# Patient Record
Sex: Male | Born: 1935 | Race: Black or African American | Hispanic: No | Smoking: Former smoker
Health system: Southern US, Community
[De-identification: ages and names within clinical notes are randomized; demographics above are authoritative.]

## PROBLEM LIST (undated history)

## (undated) DIAGNOSIS — I2699 Other pulmonary embolism without acute cor pulmonale: Secondary | ICD-10-CM

## (undated) DIAGNOSIS — D571 Sickle-cell disease without crisis: Secondary | ICD-10-CM

## (undated) DIAGNOSIS — J449 Chronic obstructive pulmonary disease, unspecified: Secondary | ICD-10-CM

## (undated) HISTORY — PX: NO PAST SURGERIES: SHX2092

---

## 1999-09-01 ENCOUNTER — Encounter: Payer: Self-pay | Admitting: Emergency Medicine

## 1999-09-01 ENCOUNTER — Inpatient Hospital Stay (HOSPITAL_COMMUNITY): Admission: EM | Admit: 1999-09-01 | Discharge: 1999-09-08 | Payer: Self-pay | Admitting: Pulmonary Disease

## 1999-09-03 ENCOUNTER — Encounter: Payer: Self-pay | Admitting: Pulmonary Disease

## 1999-09-04 ENCOUNTER — Encounter: Payer: Self-pay | Admitting: Pulmonary Disease

## 1999-09-05 ENCOUNTER — Encounter: Payer: Self-pay | Admitting: Pulmonary Disease

## 1999-09-07 ENCOUNTER — Encounter: Payer: Self-pay | Admitting: Pulmonary Disease

## 2001-09-28 ENCOUNTER — Encounter: Payer: Self-pay | Admitting: Emergency Medicine

## 2001-09-28 ENCOUNTER — Inpatient Hospital Stay (HOSPITAL_COMMUNITY): Admission: EM | Admit: 2001-09-28 | Discharge: 2001-10-10 | Payer: Self-pay | Admitting: Emergency Medicine

## 2001-10-05 ENCOUNTER — Encounter: Payer: Self-pay | Admitting: Pulmonary Disease

## 2001-10-06 ENCOUNTER — Encounter: Payer: Self-pay | Admitting: Pulmonary Disease

## 2002-01-11 ENCOUNTER — Emergency Department (HOSPITAL_COMMUNITY): Admission: EM | Admit: 2002-01-11 | Discharge: 2002-01-11 | Payer: Self-pay | Admitting: Emergency Medicine

## 2003-08-26 ENCOUNTER — Inpatient Hospital Stay (HOSPITAL_COMMUNITY): Admission: EM | Admit: 2003-08-26 | Discharge: 2003-09-02 | Payer: Self-pay

## 2003-08-27 ENCOUNTER — Encounter (INDEPENDENT_AMBULATORY_CARE_PROVIDER_SITE_OTHER): Payer: Self-pay | Admitting: Cardiology

## 2004-10-08 ENCOUNTER — Inpatient Hospital Stay (HOSPITAL_COMMUNITY): Admission: EM | Admit: 2004-10-08 | Discharge: 2004-10-20 | Payer: Self-pay | Admitting: Emergency Medicine

## 2004-10-09 ENCOUNTER — Encounter (INDEPENDENT_AMBULATORY_CARE_PROVIDER_SITE_OTHER): Payer: Self-pay | Admitting: Cardiovascular Disease

## 2004-10-16 ENCOUNTER — Encounter (INDEPENDENT_AMBULATORY_CARE_PROVIDER_SITE_OTHER): Payer: Self-pay | Admitting: *Deleted

## 2007-02-13 ENCOUNTER — Inpatient Hospital Stay (HOSPITAL_COMMUNITY): Admission: EM | Admit: 2007-02-13 | Discharge: 2007-02-17 | Payer: Self-pay | Admitting: Emergency Medicine

## 2007-04-13 ENCOUNTER — Emergency Department (HOSPITAL_COMMUNITY): Admission: EM | Admit: 2007-04-13 | Discharge: 2007-04-13 | Payer: Self-pay | Admitting: Emergency Medicine

## 2008-01-27 ENCOUNTER — Inpatient Hospital Stay (HOSPITAL_COMMUNITY): Admission: EM | Admit: 2008-01-27 | Discharge: 2008-02-03 | Payer: Self-pay | Admitting: Emergency Medicine

## 2008-05-24 ENCOUNTER — Inpatient Hospital Stay (HOSPITAL_COMMUNITY): Admission: EM | Admit: 2008-05-24 | Discharge: 2008-06-01 | Payer: Self-pay | Admitting: Emergency Medicine

## 2008-07-01 ENCOUNTER — Inpatient Hospital Stay (HOSPITAL_COMMUNITY): Admission: EM | Admit: 2008-07-01 | Discharge: 2008-07-04 | Payer: Self-pay | Admitting: Emergency Medicine

## 2009-01-02 ENCOUNTER — Encounter: Payer: Self-pay | Admitting: Pulmonary Disease

## 2009-02-15 ENCOUNTER — Inpatient Hospital Stay (HOSPITAL_COMMUNITY): Admission: EM | Admit: 2009-02-15 | Discharge: 2009-02-19 | Payer: Self-pay | Admitting: Emergency Medicine

## 2009-03-05 ENCOUNTER — Inpatient Hospital Stay (HOSPITAL_COMMUNITY): Admission: EM | Admit: 2009-03-05 | Discharge: 2009-03-12 | Payer: Self-pay | Admitting: Emergency Medicine

## 2009-04-09 DIAGNOSIS — D571 Sickle-cell disease without crisis: Secondary | ICD-10-CM

## 2009-04-09 DIAGNOSIS — J309 Allergic rhinitis, unspecified: Secondary | ICD-10-CM

## 2009-04-09 DIAGNOSIS — J441 Chronic obstructive pulmonary disease with (acute) exacerbation: Secondary | ICD-10-CM

## 2009-04-09 DIAGNOSIS — I2789 Other specified pulmonary heart diseases: Secondary | ICD-10-CM | POA: Insufficient documentation

## 2009-04-10 ENCOUNTER — Ambulatory Visit: Payer: Self-pay | Admitting: Pulmonary Disease

## 2009-04-10 DIAGNOSIS — Z86718 Personal history of other venous thrombosis and embolism: Secondary | ICD-10-CM

## 2009-04-10 DIAGNOSIS — J438 Other emphysema: Secondary | ICD-10-CM

## 2009-08-08 ENCOUNTER — Inpatient Hospital Stay (HOSPITAL_COMMUNITY): Admission: EM | Admit: 2009-08-08 | Discharge: 2009-08-09 | Payer: Self-pay | Admitting: Emergency Medicine

## 2009-12-26 ENCOUNTER — Inpatient Hospital Stay (HOSPITAL_COMMUNITY): Admission: EM | Admit: 2009-12-26 | Discharge: 2010-01-01 | Payer: Self-pay | Admitting: Emergency Medicine

## 2009-12-31 ENCOUNTER — Encounter (INDEPENDENT_AMBULATORY_CARE_PROVIDER_SITE_OTHER): Payer: Self-pay | Admitting: Internal Medicine

## 2010-08-14 ENCOUNTER — Emergency Department (HOSPITAL_COMMUNITY)
Admission: EM | Admit: 2010-08-14 | Discharge: 2010-08-14 | Payer: Self-pay | Source: Home / Self Care | Admitting: Emergency Medicine

## 2011-01-08 ENCOUNTER — Emergency Department (HOSPITAL_COMMUNITY): Payer: Medicare Other

## 2011-01-08 ENCOUNTER — Inpatient Hospital Stay (HOSPITAL_COMMUNITY)
Admission: EM | Admit: 2011-01-08 | Discharge: 2011-01-15 | DRG: 190 | Disposition: A | Discharge status: Home / Self Care | Payer: Medicare Other | Attending: Internal Medicine | Admitting: Internal Medicine

## 2011-01-08 DIAGNOSIS — E139 Other specified diabetes mellitus without complications: Secondary | ICD-10-CM | POA: Diagnosis not present

## 2011-01-08 DIAGNOSIS — J209 Acute bronchitis, unspecified: Principal | ICD-10-CM | POA: Diagnosis present

## 2011-01-08 DIAGNOSIS — T380X5A Adverse effect of glucocorticoids and synthetic analogues, initial encounter: Secondary | ICD-10-CM | POA: Diagnosis not present

## 2011-01-08 DIAGNOSIS — Z7901 Long term (current) use of anticoagulants: Secondary | ICD-10-CM

## 2011-01-08 DIAGNOSIS — R079 Chest pain, unspecified: Secondary | ICD-10-CM | POA: Diagnosis present

## 2011-01-08 DIAGNOSIS — Z86711 Personal history of pulmonary embolism: Secondary | ICD-10-CM

## 2011-01-08 DIAGNOSIS — J440 Chronic obstructive pulmonary disease with acute lower respiratory infection: Principal | ICD-10-CM | POA: Diagnosis present

## 2011-01-08 DIAGNOSIS — I10 Essential (primary) hypertension: Secondary | ICD-10-CM | POA: Diagnosis present

## 2011-01-08 DIAGNOSIS — D5700 Hb-SS disease with crisis, unspecified: Secondary | ICD-10-CM | POA: Diagnosis not present

## 2011-01-08 DIAGNOSIS — D509 Iron deficiency anemia, unspecified: Secondary | ICD-10-CM | POA: Diagnosis present

## 2011-01-08 DIAGNOSIS — E871 Hypo-osmolality and hyponatremia: Secondary | ICD-10-CM | POA: Diagnosis not present

## 2011-01-08 DIAGNOSIS — K5900 Constipation, unspecified: Secondary | ICD-10-CM | POA: Diagnosis not present

## 2011-01-08 LAB — CBC
HCT: 26.4 % — ABNORMAL LOW (ref 39.0–52.0)
Hemoglobin: 8.3 g/dL — ABNORMAL LOW (ref 13.0–17.0)
MCH: 18.8 pg — ABNORMAL LOW (ref 26.0–34.0)
MCHC: 31.4 g/dL (ref 30.0–36.0)
MCV: 59.7 fL — ABNORMAL LOW (ref 78.0–100.0)
Platelets: 217 10*3/uL (ref 150–400)
RBC: 4.42 MIL/uL (ref 4.22–5.81)
RDW: 23.1 % — ABNORMAL HIGH (ref 11.5–15.5)
WBC: 5 10*3/uL (ref 4.0–10.5)

## 2011-01-08 LAB — BASIC METABOLIC PANEL
BUN: 6 mg/dL (ref 6–23)
CO2: 30 mEq/L (ref 19–32)
Calcium: 8.9 mg/dL (ref 8.4–10.5)
Chloride: 97 mEq/L (ref 96–112)
Creatinine, Ser: 1.44 mg/dL (ref 0.4–1.5)
GFR calc Af Amer: 58 mL/min — ABNORMAL LOW (ref >60)
GFR calc non Af Amer: 48 mL/min — ABNORMAL LOW (ref >60)
Glucose, Bld: 107 mg/dL — ABNORMAL HIGH (ref 70–99)
Potassium: 3.9 mEq/L (ref 3.5–5.1)
Sodium: 132 mEq/L — ABNORMAL LOW (ref 135–145)

## 2011-01-08 LAB — URINALYSIS, ROUTINE W REFLEX MICROSCOPIC
Bilirubin Urine: NEGATIVE
Glucose, UA: NEGATIVE mg/dL
Hgb urine dipstick: NEGATIVE
Ketones, ur: NEGATIVE mg/dL
Nitrite: NEGATIVE
Protein, ur: NEGATIVE mg/dL
Specific Gravity, Urine: 1.012 (ref 1.005–1.030)
Urobilinogen, UA: 0.2 mg/dL (ref 0.0–1.0)
pH: 6 (ref 5.0–8.0)

## 2011-01-08 LAB — PROTIME-INR
INR: 1.12 (ref 0.00–1.49)
Prothrombin Time: 14.6 s (ref 11.6–15.2)

## 2011-01-08 LAB — DIFFERENTIAL
Basophils Absolute: 0.1 10*3/uL (ref 0.0–0.1)
Basophils Relative: 1 % (ref 0–1)
Eosinophils Absolute: 0.4 K/uL (ref 0.0–0.7)
Eosinophils Relative: 8 % — ABNORMAL HIGH (ref 0–5)
Lymphocytes Relative: 30 % (ref 12–46)
Lymphs Abs: 1.5 10*3/uL (ref 0.7–4.0)
Monocytes Absolute: 0.4 10*3/uL (ref 0.1–1.0)
Monocytes Relative: 8 % (ref 3–12)
Neutro Abs: 2.6 10*3/uL (ref 1.7–7.7)
Neutrophils Relative %: 53 % (ref 43–77)

## 2011-01-08 LAB — APTT: aPTT: 34 s (ref 24–37)

## 2011-01-09 LAB — FERRITIN: Ferritin: 12 ng/mL — ABNORMAL LOW (ref 22–322)

## 2011-01-09 LAB — VITAMIN B12: Vitamin B-12: 342 pg/mL (ref 211–911)

## 2011-01-09 LAB — COMPREHENSIVE METABOLIC PANEL
ALT: 9 U/L (ref 0–53)
AST: 21 U/L (ref 0–37)
Albumin: 3.6 g/dL (ref 3.5–5.2)
Calcium: 9 mg/dL (ref 8.4–10.5)
GFR calc Af Amer: >60 mL/min (ref >60)
Sodium: 134 mEq/L — ABNORMAL LOW (ref 135–145)
Total Protein: 6.8 g/dL (ref 6.0–8.3)

## 2011-01-09 LAB — IRON AND TIBC: UIBC: 310 ug/dL

## 2011-01-09 LAB — FOLATE: Folate: >20 ng/mL

## 2011-01-09 LAB — PROTIME-INR: Prothrombin Time: 14.7 seconds (ref 11.6–15.2)

## 2011-01-09 LAB — CBC
Hemoglobin: 8.4 g/dL — ABNORMAL LOW (ref 13.0–17.0)
Platelets: 225 10*3/uL (ref 150–400)
RBC: 4.48 MIL/uL (ref 4.22–5.81)
WBC: 4.1 10*3/uL (ref 4.0–10.5)

## 2011-01-09 NOTE — H&P (Signed)
NAME:  Anthony [name], Anthony [name] NO.:  [id number]  MEDICAL RECORD NO.:  [id number]           PATIENT TYPE:  E  LOCATION:  WLED                         FACILITY:  [facility]  PHYSICIAN:  Conley Canal, MD      DATE OF BIRTH:  01/24/1936  DATE OF ADMISSION:  01/08/2011 DATE OF DISCHARGE:                             HISTORY & PHYSICAL   PRIMARY CARE PHYSICIAN:  Fleet Contras, MD  CHIEF COMPLAINT:  Shortness of breath, cough x5 days.  HISTORY OF PRESENT ILLNESS:  Anthony [name] is a pleasant 75 year old male with history of COPD, sickle cell, history of PE, on Coumadin, who comes in with complaints of worsening shortness of breath associated with subjective fever and chills, cough, productive of clear colored sputum. No chest pain.  He also felt that his chest was tight.  In the emergency room, the patient was found to be an acute exacerbation of COPD, hence referral to Hospitalist Service for further management.  He says he feels somewhat better since coming to the emergency room.  He denies any nausea, vomiting, diarrhea.  No genitourinary symptoms.  No hemoptysis.  PAST MEDICAL HISTORY: 1. COPD. 2. History of PE. 3. Sickle cell disease.  ALLERGIES:  No known drug allergies.  FAMILY HISTORY:  Mother has sickle cell disease.  SOCIAL HISTORY:  The patient says he quit cigarette smoking about 1-1/2 years ago, occasionally drinks alcohol.  Denies illicit drugs.  Lives alone.  HOME MEDICATIONS:  Albuterol, Combivent, Coumadin, fexofenadine, fluticasone, Levaquin, Protonix, Symbicort.  REVIEW OF SYSTEMS:  Unremarkable except as highlighted in the history of present illness.  PHYSICAL EXAMINATION:  GENERAL:  This is a frail elderly male who is in mild respiratory distress. VITAL SIGNS:  Blood pressure 150/80, heart rate is 98, temperature 97.9, respirations 20, oxygen saturation is 98% on 2 liters nasal cannula. HEAD, EARS, NOSE AND THROAT:  The patient looks thin.  Pupils  are equal and reacting to light. NECK:  No jugular venous distention.  No carotid bruits. RESPIRATORY:  Reduced air entry bilaterally with no rhonchi or wheezes. CARDIOVASCULAR:  First and second heart sounds heard.  No murmurs. Pulse regular. ABDOMEN:  Scaphoid, soft, nontender.  No palpable organomegaly.  Bowel sounds are normal. CNS:  The patient is alert and oriented in person, place, and time with no acute focal neurological deficits. EXTREMITIES:  No pedal edema.  Peripheral pulses are equal.  Labs were reviewed significant for WBC 5, hemoglobin 8.3, hematocrit 26.4, platelet count 217.  Sodium 132, potassium 3.9, BUN 6, creatinine 1.44, calcium 8.9, PT 14.6, INR 1.12, APTT 34.  Urinalysis negative. Chest x-ray shows hyperexpansion and scarring without acute findings.  IMPRESSION:  A 75 year old male with history of chronic obstructive pulmonary disease, pulmonary embolism, so possibly on Coumadin who comes in with shortness of breath, subjective fever, and cough secondary to acute exacerbation of chronic obstructive pulmonary disease.  PLAN: 1. Acute exacerbation of COPD.  We will admit the patient for systemic     steroids, bronchodilators, oxygen supplementation as necessary.  We     will cover with ceftriaxone and azithromycin.  Obtain sputum  culture and sensitivity. 2. Sickle cell trait.  Hemoglobin seems stable.  We will monitor. 3. History of PE.  INR is subtherapeutic, not sure if the patient has     been taking the Coumadin and I am not sure what the duration of     Coumadin should be.  We will confirm with the patient to decide     whether to continue Coumadin or not.  Meanwhile, he will be on     Lovenox. 4. GI prophylaxis.  PPI.  The patient's condition is guarded.     Conley Canal, MD     SR/MEDQ  D:  01/08/2011  T:  01/08/2011  Job:  [id number]  cc:   Fleet Contras, M.D. Fax: [phone]  Electronically Signed by Conley Canal  on 01/09/2011  05:08:47 AM

## 2011-01-10 LAB — MAGNESIUM: Magnesium: 2.1 mg/dL (ref 1.5–2.5)

## 2011-01-10 LAB — CBC
MCV: 59.1 fL — ABNORMAL LOW (ref 78.0–100.0)
Platelets: 236 10*3/uL (ref 150–400)
RDW: 23.1 % — ABNORMAL HIGH (ref 11.5–15.5)
WBC: 8.5 10*3/uL (ref 4.0–10.5)

## 2011-01-10 LAB — EXPECTORATED SPUTUM ASSESSMENT W GRAM STAIN, RFLX TO RESP C

## 2011-01-10 LAB — BASIC METABOLIC PANEL
BUN: 13 mg/dL (ref 6–23)
GFR calc non Af Amer: 50 mL/min — ABNORMAL LOW (ref >60)
Potassium: 4.3 mEq/L (ref 3.5–5.1)
Sodium: 135 mEq/L (ref 135–145)

## 2011-01-10 LAB — HEMOGLOBIN A1C: Hgb A1c MFr Bld: 5.1 % (ref <5.7)

## 2011-01-11 LAB — PROTIME-INR: Prothrombin Time: 19 seconds — ABNORMAL HIGH (ref 11.6–15.2)

## 2011-01-12 LAB — CBC
HCT: 24.9 % — ABNORMAL LOW (ref 39.0–52.0)
Hemoglobin: 8.1 g/dL — ABNORMAL LOW (ref 13.0–17.0)
MCH: 19.4 pg — ABNORMAL LOW (ref 26.0–34.0)
MCHC: 32.5 g/dL (ref 30.0–36.0)
MCV: 59.6 fL — ABNORMAL LOW (ref 78.0–100.0)
Platelets: 232 K/uL (ref 150–400)
RBC: 4.18 MIL/uL — ABNORMAL LOW (ref 4.22–5.81)
RDW: 22.7 % — ABNORMAL HIGH (ref 11.5–15.5)
WBC: 8.5 K/uL (ref 4.0–10.5)

## 2011-01-12 LAB — DIFFERENTIAL
Basophils Absolute: 0 K/uL (ref 0.0–0.1)
Basophils Relative: 0 % (ref 0–1)
Eosinophils Absolute: 0 K/uL (ref 0.0–0.7)
Eosinophils Relative: 0 % (ref 0–5)
Lymphocytes Relative: 5 % — ABNORMAL LOW (ref 12–46)
Lymphs Abs: 0.4 K/uL — ABNORMAL LOW (ref 0.7–4.0)
Monocytes Absolute: 0.2 K/uL (ref 0.1–1.0)
Monocytes Relative: 3 % (ref 3–12)
Neutro Abs: 7.9 K/uL — ABNORMAL HIGH (ref 1.7–7.7)
Neutrophils Relative %: 92 % — ABNORMAL HIGH (ref 43–77)

## 2011-01-13 ENCOUNTER — Inpatient Hospital Stay (HOSPITAL_COMMUNITY): Payer: Medicare Other

## 2011-01-13 LAB — CULTURE, RESPIRATORY W GRAM STAIN

## 2011-01-14 LAB — BASIC METABOLIC PANEL
BUN: 33 mg/dL — ABNORMAL HIGH (ref 6–23)
Chloride: 99 mEq/L (ref 96–112)
Creatinine, Ser: 1.74 mg/dL — ABNORMAL HIGH (ref 0.4–1.5)
Glucose, Bld: 95 mg/dL (ref 70–99)
Potassium: 4.4 mEq/L (ref 3.5–5.1)

## 2011-01-14 LAB — CBC
HCT: 27.9 % — ABNORMAL LOW (ref 39.0–52.0)
HCT: 24.8 % — ABNORMAL LOW (ref 39.0–52.0)
MCH: 18.5 pg — ABNORMAL LOW (ref 26.0–34.0)
MCV: 62.1 fL — ABNORMAL LOW (ref 78.0–100.0)
MCV: 60.3 fL — ABNORMAL LOW (ref 78.0–100.0)
RBC: 4.49 MIL/uL (ref 4.22–5.81)
RBC: 4.11 MIL/uL — ABNORMAL LOW (ref 4.22–5.81)
WBC: 4.5 10*3/uL (ref 4.0–10.5)
WBC: 8.1 10*3/uL (ref 4.0–10.5)

## 2011-01-14 LAB — DIFFERENTIAL
Band Neutrophils: 0 % (ref 0–10)
Basophils Absolute: 0 10*3/uL (ref 0.0–0.1)
Basophils Relative: 0 % (ref 0–1)
Blasts: 0 %
Lymphocytes Relative: 32 % (ref 12–46)
Lymphs Abs: 1.4 10*3/uL (ref 0.7–4.0)
Monocytes Absolute: 0.1 10*3/uL (ref 0.1–1.0)
Monocytes Relative: 2 % — ABNORMAL LOW (ref 3–12)
Neutro Abs: 2.9 10*3/uL (ref 1.7–7.7)
Neutrophils Relative %: 63 % (ref 43–77)
Promyelocytes Absolute: 0 %

## 2011-01-14 LAB — COMPREHENSIVE METABOLIC PANEL
ALT: 9 U/L (ref 0–53)
Albumin: 4.2 g/dL (ref 3.5–5.2)
Alkaline Phosphatase: 60 U/L (ref 39–117)
BUN: 7 mg/dL (ref 6–23)
Chloride: 104 mEq/L (ref 96–112)
Glucose, Bld: 101 mg/dL — ABNORMAL HIGH (ref 70–99)
Potassium: 4.7 mEq/L (ref 3.5–5.1)
Total Bilirubin: 0.7 mg/dL (ref 0.3–1.2)

## 2011-01-15 LAB — BASIC METABOLIC PANEL
BUN: 29 mg/dL — ABNORMAL HIGH (ref 6–23)
CO2: 35 mEq/L — ABNORMAL HIGH (ref 19–32)
Chloride: 94 mEq/L — ABNORMAL LOW (ref 96–112)
Potassium: 4.5 mEq/L (ref 3.5–5.1)

## 2011-01-15 LAB — PROTIME-INR: INR: 1.62 — ABNORMAL HIGH (ref 0.00–1.49)

## 2011-01-15 LAB — CBC
HCT: 28.4 % — ABNORMAL LOW (ref 39.0–52.0)
Hemoglobin: 9.3 g/dL — ABNORMAL LOW (ref 13.0–17.0)
MCH: 20.8 pg — ABNORMAL LOW (ref 26.0–34.0)
MCHC: 32.7 g/dL (ref 30.0–36.0)
MCV: 63.4 fL — ABNORMAL LOW (ref 78.0–100.0)

## 2011-01-15 LAB — CROSSMATCH
ABO/RH(D): A POS
Antibody Screen: NEGATIVE

## 2011-01-16 NOTE — Discharge Summary (Signed)
NAME:  Anthony [name], Anthony [name] NO.:  [id number]  MEDICAL RECORD NO.:  [id number]           PATIENT TYPE:  I  LOCATION:  1332                         FACILITY:  [facility]  PHYSICIAN:  Isidor Holts, M.D.  DATE OF BIRTH:  September 10, 1936  DATE OF ADMISSION:  01/08/2011 DATE OF DISCHARGE:  01/15/2011                              DISCHARGE SUMMARY   PRIMARY CARE PHYSICIAN:  Fleet Contras, M.D.  DISCHARGE DIAGNOSES: 1. Acute bronchitis. 2. Infective exacerbation of chronic obstructive pulmonary disease,     secondary to acute bronchitis. 3. Sickle cell disease plus mild pain crisis. 4. Anemia.  Required transfusion of one unit of packed red blood cells     on January 14, 2011. 5. 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 6. Hypertension.  DISCHARGE MEDICATIONS: 1. Amlodipine 5 mg p.o. daily. 2. Lovenox 80 mg subcutaneously q.24 hours.  A 7-day supply has been     dispensed. 3. Ensure Clinical Strength strawberries/cream 237 mL p.o. t.i.d. with     meals. 4. Ensure vanilla pudding 113 mL p.o. b.i.d. 5. Folic acid 1 mg p.o. daily. 6. Mucinex 600 mg p.o. b.i.d. for 7 days. 7. Avelox 400 mg p.o. daily for 3 days. 8. Prednisone 40 mg p.o. daily for 3 days, then 30 mg p.o. daily for 3     days, then 20 mg p.o. daily for 3 days, then 10 mg p.o. daily for 3     days, then stop. 9. Warfarin per INR.  Currently on 7.5 mg at 6:00 p.m. daily p.o.     (target INR between 2 to 3). 10.Albuterol nebulizer one vial p.r.n. q.4 hours for shortness of     breath. 11.Combivent inhaler two puffs q.i.d. 12.Fexofenadine 60 mg p.o. p.r.n. daily for allergies. 13.Flonase nasal spray one spray each nostril daily. 14.Hydrocodone/APAP (5/500) one p.o. p.r.n. q.4-8 hours for pain. 15.Protonix 40 mg p.o. daily. 16.Symbicort (160/4.5 mcg) two puffs inhaled b.i.d. p.r.n. for     shortness of breath. 17.Vitamin B OTC one tablet p.o. daily.  Note: Levaquin has been  discontinued.  PROCEDURES: 1. Chest x-ray January 08, 2011.  This showed hyperexpansion and scarring     without acute findings. 2. Chest x-ray January 13, 2011.  This showed COPD stable, pleural     scarring at the bases.  No acute process.  CONSULTATIONS:  None.  ADMISSION HISTORY:  As in the H and P notes of January 08, 2011, dictated by Dr. Conley Canal.  However, in brief this is a 75 year old male, with known history of COPD, previous pulmonary embolism, sickle cell disease, who is presenting with shortness of breath and a productive cough of approximately 5 days' duration, associated with subjective fevers and chills.  He was admitted for further evaluation, investigation and management.  CLINICAL COURSE: 1. Acute bronchitis.  The patient presents as described above.  Chest     x-ray showed no acute findings.  There was evidence of emphysema     and scarring, but no pneumonic consolidation.  He was managed for     acute bronchitis, with intravenous antibiotics.  As of January 15, 2011, he was on day #8 of a planned 10 day course of therapy and     clinically he had improved, with considerable amelioration of cough     and improvement in respiratory status.  2. Infective exacerbation of COPD.  This was secondary to acute     bronchitis above, and was managed with bronchodilator nebulizers,     Mucinex, oxygen supplementation, as well as intravenous steroids.     The patient was subsequently transitioned to prednisone taper with     improvement in clinical condition.  3. Sickle cell disease.  The patient did have a mild pain crisis,     likely secondary to increased work of breathing and affected mainly     bilateral lower ribs, which responded to analgesics as well as     adequate hydration.  4. Anemia.  The patient's hemoglobin at the time of presentation was     8.3.  On January 14, 2011, hemoglobin had dropped to 7.6.  He was     therefore on that date, transfused with one  unit of PRBC, resulting     in a posttransfusion bump in hemoglobin to 9.3 on January 15, 2011.  5. Hypertension.  The patient did have mild hypertension during the     course of this hospitalization, which was readily addressed with     Norvasc at a dose of 5 mg p.o. daily.  As of January 15, 2011, blood     pressure was normal at 117/63 mmHg.  6. History of venous thromboembolic disease.  The patient has a known     history of previous pulmonary embolism.  His INR was subtherapeutic     at 1.12 at the time of presentation.  He was therefore managed with     bridging Lovenox and concomitant Coumadin therapy.  As of January 15, 2011, INR was improved at 1.62 and he is expected to continue     subcutaneous Lovenox until there is at least a 48-hour overlap with     therapeutic INR.  The patient is recommended to have his PT/INR     checked on January 18, 2011, at his primary MD's office.  His primary     MD will thereafter, recommend dose of Coumadin as indicated and also     recommend a discontinuation date for Lovenox.  DISPOSITION:  The patient was considered clinically stable for discharge on January 15, 2011.  He was therefore discharged accordingly.  ACTIVITY:  As tolerated.  Recommended to increase activity slowly.  DIET:  Heart-healthy.  FOLLOWUP INSTRUCTIONS:  The patient is to follow up with his primary MD, Dr. Fleet Contras, next week.  He was instructed to call for an appointment.  In addition, the patient is to present to his primary MD's office in a.m. of January 18, 2011, for PT/INR check.  His primary MD will thereafter recommend adjustments to Coumadin dosage as indicated and date of discontinuation of Lovenox.  All of this has been communicated to the patient and he has verbalized understanding.     Isidor Holts, M.D.     CO/MEDQ  D:  01/15/2011  T:  01/15/2011  Job:  [id number]  cc:   Fleet Contras, M.D. Fax: [phone]  Electronically Signed by Isidor Holts M.D. on 01/16/2011 06:53:21 PM

## 2011-01-20 LAB — CBC
Hemoglobin: 7.9 g/dL — ABNORMAL LOW (ref 13.0–17.0)
Hemoglobin: 8.1 g/dL — ABNORMAL LOW (ref 13.0–17.0)
Hemoglobin: 8.8 g/dL — ABNORMAL LOW (ref 13.0–17.0)
Hemoglobin: 9.3 g/dL — ABNORMAL LOW (ref 13.0–17.0)
MCHC: 30.9 g/dL (ref 30.0–36.0)
MCHC: 31.5 g/dL (ref 30.0–36.0)
MCV: 63 fL — ABNORMAL LOW (ref 78.0–100.0)
MCV: 63.5 fL — ABNORMAL LOW (ref 78.0–100.0)
Platelets: 183 10*3/uL (ref 150–400)
Platelets: 191 10*3/uL (ref 150–400)
RBC: 4.51 MIL/uL (ref 4.22–5.81)
RBC: 4.65 MIL/uL (ref 4.22–5.81)
RDW: 25.5 % — ABNORMAL HIGH (ref 11.5–15.5)
RDW: 26 % — ABNORMAL HIGH (ref 11.5–15.5)

## 2011-01-20 LAB — PROTIME-INR
INR: 2.17 — ABNORMAL HIGH (ref 0.00–1.49)
INR: 3.51 — ABNORMAL HIGH (ref 0.00–1.49)
INR: 3.53 — ABNORMAL HIGH (ref 0.00–1.49)
Prothrombin Time: 24 seconds — ABNORMAL HIGH (ref 11.6–15.2)
Prothrombin Time: 28.4 seconds — ABNORMAL HIGH (ref 11.6–15.2)
Prothrombin Time: 34.9 seconds — ABNORMAL HIGH (ref 11.6–15.2)

## 2011-01-20 LAB — CARDIAC PANEL(CRET KIN+CKTOT+MB+TROPI)
CK, MB: 3.3 ng/mL (ref 0.3–4.0)
Relative Index: 2.5 (ref 0.0–2.5)
Total CK: 130 U/L (ref 7–232)

## 2011-01-20 LAB — DIFFERENTIAL
Band Neutrophils: 0 % (ref 0–10)
Basophils Absolute: 0.1 10*3/uL (ref 0.0–0.1)
Basophils Relative: 0 % (ref 0–1)
Basophils Relative: 1 % (ref 0–1)
Eosinophils Relative: 0 % (ref 0–5)
Monocytes Absolute: 0 10*3/uL — ABNORMAL LOW (ref 0.1–1.0)
Myelocytes: 0 %
Neutrophils Relative %: 58 % (ref 43–77)
Neutrophils Relative %: 95 % — ABNORMAL HIGH (ref 43–77)
Promyelocytes Absolute: 0 %

## 2011-01-20 LAB — D-DIMER, QUANTITATIVE: D-Dimer, Quant: 0.58 ug/mL-FEU — ABNORMAL HIGH (ref 0.00–0.48)

## 2011-01-20 LAB — COMPREHENSIVE METABOLIC PANEL
Albumin: 3.5 g/dL (ref 3.5–5.2)
Alkaline Phosphatase: 62 U/L (ref 39–117)
BUN: 6 mg/dL (ref 6–23)
GFR calc Af Amer: >60 mL/min (ref >60)
Potassium: 4.2 mEq/L (ref 3.5–5.1)
Sodium: 137 mEq/L (ref 135–145)
Total Protein: 6.8 g/dL (ref 6.0–8.3)

## 2011-01-20 LAB — CULTURE, BLOOD (ROUTINE X 2): Culture: NO GROWTH

## 2011-01-20 LAB — BASIC METABOLIC PANEL
CO2: 28 mEq/L (ref 19–32)
Chloride: 102 mEq/L (ref 96–112)
Creatinine, Ser: 1.29 mg/dL (ref 0.4–1.5)
GFR calc Af Amer: >60 mL/min (ref >60)

## 2011-01-20 LAB — CK TOTAL AND CKMB (NOT AT ARMC)
CK, MB: 3.2 ng/mL (ref 0.3–4.0)
Total CK: 146 U/L (ref 7–232)

## 2011-01-20 LAB — IRON AND TIBC
Iron: 18 ug/dL — ABNORMAL LOW (ref 42–135)
TIBC: 334 ug/dL (ref 215–435)

## 2011-01-20 LAB — HEMOCCULT GUIAC POC 1CARD (OFFICE): Fecal Occult Bld: POSITIVE

## 2011-01-24 LAB — CBC
MCHC: 31.3 g/dL (ref 30.0–36.0)
MCV: 64.4 fL — ABNORMAL LOW (ref 78.0–100.0)
Platelets: 204 10*3/uL (ref 150–400)
Platelets: 221 10*3/uL (ref 150–400)
RDW: 25.4 % — ABNORMAL HIGH (ref 11.5–15.5)
RDW: 26 % — ABNORMAL HIGH (ref 11.5–15.5)
WBC: 5.1 10*3/uL (ref 4.0–10.5)
WBC: 6.1 10*3/uL (ref 4.0–10.5)

## 2011-01-24 LAB — TYPE AND SCREEN
ABO/RH(D): A POS
Antibody Screen: NEGATIVE

## 2011-01-24 LAB — PROTIME-INR
INR: 1.49 (ref 0.00–1.49)
INR: 1.57 — ABNORMAL HIGH (ref 0.00–1.49)
Prothrombin Time: 17.9 seconds — ABNORMAL HIGH (ref 11.6–15.2)
Prothrombin Time: 18.6 seconds — ABNORMAL HIGH (ref 11.6–15.2)
Prothrombin Time: 18.6 seconds — ABNORMAL HIGH (ref 11.6–15.2)

## 2011-01-24 LAB — BASIC METABOLIC PANEL
BUN: 10 mg/dL (ref 6–23)
Creatinine, Ser: 1.41 mg/dL (ref 0.4–1.5)
GFR calc non Af Amer: 50 mL/min — ABNORMAL LOW (ref >60)
Glucose, Bld: 81 mg/dL (ref 70–99)

## 2011-02-04 LAB — DIFFERENTIAL
Band Neutrophils: 0 % (ref 0–10)
Basophils Absolute: 0 10*3/uL (ref 0.0–0.1)
Basophils Absolute: 0 10*3/uL (ref 0.0–0.1)
Basophils Relative: 0 % (ref 0–1)
Eosinophils Absolute: 0.2 10*3/uL (ref 0.0–0.7)
Lymphocytes Relative: 24 % (ref 12–46)
Lymphs Abs: 0.7 10*3/uL (ref 0.7–4.0)
Monocytes Absolute: 0.3 10*3/uL (ref 0.1–1.0)
Monocytes Absolute: 0.4 10*3/uL (ref 0.1–1.0)
Monocytes Relative: 12 % (ref 3–12)
Neutrophils Relative %: 60 % (ref 43–77)
Promyelocytes Absolute: 0 %

## 2011-02-04 LAB — BASIC METABOLIC PANEL
CO2: 27 mEq/L (ref 19–32)
Calcium: 9.2 mg/dL (ref 8.4–10.5)
Creatinine, Ser: 1.27 mg/dL (ref 0.4–1.5)
GFR calc Af Amer: >60 mL/min (ref >60)
GFR calc non Af Amer: 56 mL/min — ABNORMAL LOW (ref >60)

## 2011-02-04 LAB — CBC
HCT: 30.3 % — ABNORMAL LOW (ref 39.0–52.0)
HCT: 30.4 % — ABNORMAL LOW (ref 39.0–52.0)
Hemoglobin: 9.6 g/dL — ABNORMAL LOW (ref 13.0–17.0)
Hemoglobin: 9.6 g/dL — ABNORMAL LOW (ref 13.0–17.0)
MCV: 65.5 fL — ABNORMAL LOW (ref 78.0–100.0)
RDW: 28.5 % — ABNORMAL HIGH (ref 11.5–15.5)
RDW: 28.5 % — ABNORMAL HIGH (ref 11.5–15.5)
WBC: 4.1 10*3/uL (ref 4.0–10.5)

## 2011-02-04 LAB — PROTIME-INR
INR: 1.74 — ABNORMAL HIGH (ref 0.00–1.49)
Prothrombin Time: 20.2 seconds — ABNORMAL HIGH (ref 11.6–15.2)
Prothrombin Time: 20.9 seconds — ABNORMAL HIGH (ref 11.6–15.2)

## 2011-02-04 LAB — COMPREHENSIVE METABOLIC PANEL
Alkaline Phosphatase: 64 U/L (ref 39–117)
BUN: 7 mg/dL (ref 6–23)
Chloride: 101 mEq/L (ref 96–112)
Glucose, Bld: 83 mg/dL (ref 70–99)
Potassium: 4.4 mEq/L (ref 3.5–5.1)
Total Bilirubin: 0.8 mg/dL (ref 0.3–1.2)

## 2011-02-04 LAB — CULTURE, BLOOD (ROUTINE X 2): Culture: NO GROWTH

## 2011-02-04 LAB — PHOSPHORUS: Phosphorus: 2.7 mg/dL (ref 2.3–4.6)

## 2011-02-04 LAB — CARDIAC PANEL(CRET KIN+CKTOT+MB+TROPI): Relative Index: INVALID (ref 0.0–2.5)

## 2011-02-09 LAB — BASIC METABOLIC PANEL
BUN: 13 mg/dL (ref 6–23)
CO2: 29 mEq/L (ref 19–32)
CO2: 30 mEq/L (ref 19–32)
Chloride: 103 mEq/L (ref 96–112)
Chloride: 104 mEq/L (ref 96–112)
GFR calc Af Amer: >60 mL/min (ref >60)
GFR calc Af Amer: >60 mL/min (ref >60)
GFR calc non Af Amer: 51 mL/min — ABNORMAL LOW (ref >60)
GFR calc non Af Amer: 54 mL/min — ABNORMAL LOW (ref >60)
Glucose, Bld: 137 mg/dL — ABNORMAL HIGH (ref 70–99)
Glucose, Bld: 92 mg/dL (ref 70–99)
Glucose, Bld: 96 mg/dL (ref 70–99)
Potassium: 3.2 mEq/L — ABNORMAL LOW (ref 3.5–5.1)
Potassium: 3.8 mEq/L (ref 3.5–5.1)
Potassium: 4.1 mEq/L (ref 3.5–5.1)
Potassium: 4.3 mEq/L (ref 3.5–5.1)
Sodium: 136 mEq/L (ref 135–145)
Sodium: 140 mEq/L (ref 135–145)
Sodium: 140 mEq/L (ref 135–145)
Sodium: 140 mEq/L (ref 135–145)

## 2011-02-09 LAB — CROSSMATCH
ABO/RH(D): A POS
Antibody Screen: NEGATIVE

## 2011-02-09 LAB — URINE MICROSCOPIC-ADD ON

## 2011-02-09 LAB — CBC
HCT: 18.2 % — ABNORMAL LOW (ref 39.0–52.0)
HCT: 24.2 % — ABNORMAL LOW (ref 39.0–52.0)
HCT: 24.4 % — ABNORMAL LOW (ref 39.0–52.0)
HCT: 25.8 % — ABNORMAL LOW (ref 39.0–52.0)
HCT: 28 % — ABNORMAL LOW (ref 39.0–52.0)
HCT: 29 % — ABNORMAL LOW (ref 39.0–52.0)
Hemoglobin: 7.9 g/dL — CL (ref 13.0–17.0)
Hemoglobin: 8 g/dL — ABNORMAL LOW (ref 13.0–17.0)
Hemoglobin: 8.6 g/dL — ABNORMAL LOW (ref 13.0–17.0)
Hemoglobin: 9.2 g/dL — ABNORMAL LOW (ref 13.0–17.0)
Hemoglobin: 9.6 g/dL — ABNORMAL LOW (ref 13.0–17.0)
MCHC: 31.4 g/dL (ref 30.0–36.0)
MCHC: 31.4 g/dL (ref 30.0–36.0)
MCHC: 32.7 g/dL (ref 30.0–36.0)
MCHC: 32.8 g/dL (ref 30.0–36.0)
MCV: 63.6 fL — ABNORMAL LOW (ref 78.0–100.0)
MCV: 64.1 fL — ABNORMAL LOW (ref 78.0–100.0)
MCV: 71.9 fL — ABNORMAL LOW (ref 78.0–100.0)
MCV: 72 fL — ABNORMAL LOW (ref 78.0–100.0)
MCV: 72.4 fL — ABNORMAL LOW (ref 78.0–100.0)
Platelets: 190 10*3/uL (ref 150–400)
Platelets: 191 10*3/uL (ref 150–400)
Platelets: 192 10*3/uL (ref 150–400)
Platelets: 203 10*3/uL (ref 150–400)
Platelets: 232 10*3/uL (ref 150–400)
RBC: 2.86 MIL/uL — ABNORMAL LOW (ref 4.22–5.81)
RBC: 3.36 MIL/uL — ABNORMAL LOW (ref 4.22–5.81)
RDW: 34.2 % — ABNORMAL HIGH (ref 11.5–15.5)
RDW: 36.3 % — ABNORMAL HIGH (ref 11.5–15.5)
RDW: 37 % — ABNORMAL HIGH (ref 11.5–15.5)
RDW: 37 % — ABNORMAL HIGH (ref 11.5–15.5)
WBC: 6.1 10*3/uL (ref 4.0–10.5)
WBC: 6.8 10*3/uL (ref 4.0–10.5)
WBC: 7 10*3/uL (ref 4.0–10.5)

## 2011-02-09 LAB — URINALYSIS, ROUTINE W REFLEX MICROSCOPIC
Bilirubin Urine: NEGATIVE
Glucose, UA: NEGATIVE mg/dL
Hgb urine dipstick: NEGATIVE
Specific Gravity, Urine: 1.016 (ref 1.005–1.030)
pH: 5.5 (ref 5.0–8.0)

## 2011-02-09 LAB — COMPREHENSIVE METABOLIC PANEL
AST: 20 U/L (ref 0–37)
Albumin: 3.5 g/dL (ref 3.5–5.2)
Calcium: 8.5 mg/dL (ref 8.4–10.5)
Creatinine, Ser: 1.54 mg/dL — ABNORMAL HIGH (ref 0.4–1.5)
GFR calc Af Amer: 54 mL/min — ABNORMAL LOW (ref >60)
GFR calc non Af Amer: 45 mL/min — ABNORMAL LOW (ref >60)

## 2011-02-09 LAB — PROTIME-INR
INR: 1.5 (ref 0.00–1.49)
INR: 1.6 — ABNORMAL HIGH (ref 0.00–1.49)
INR: 1.9 — ABNORMAL HIGH (ref 0.00–1.49)
INR: 1.9 — ABNORMAL HIGH (ref 0.00–1.49)
INR: 7.2 (ref 0.00–1.49)
Prothrombin Time: 19.7 seconds — ABNORMAL HIGH (ref 11.6–15.2)
Prothrombin Time: 22.8 seconds — ABNORMAL HIGH (ref 11.6–15.2)
Prothrombin Time: 23.2 seconds — ABNORMAL HIGH (ref 11.6–15.2)
Prothrombin Time: 69.3 seconds — ABNORMAL HIGH (ref 11.6–15.2)

## 2011-02-09 LAB — DIFFERENTIAL
Eosinophils Relative: 4 % (ref 0–5)
Lymphs Abs: 0.7 10*3/uL (ref 0.7–4.0)
Monocytes Relative: 2 % — ABNORMAL LOW (ref 3–12)
Neutro Abs: 6.6 10*3/uL (ref 1.7–7.7)

## 2011-02-09 LAB — RETICULOCYTES: RBC.: 2.96 MIL/uL — ABNORMAL LOW (ref 4.22–5.81)

## 2011-02-09 LAB — PREPARE FRESH FROZEN PLASMA

## 2011-02-10 LAB — COMPREHENSIVE METABOLIC PANEL
ALT: <8 U/L (ref 0–53)
BUN: 10 mg/dL (ref 6–23)
BUN: 9 mg/dL (ref 6–23)
CO2: 29 mEq/L (ref 19–32)
CO2: 30 mEq/L (ref 19–32)
Calcium: 8.3 mg/dL — ABNORMAL LOW (ref 8.4–10.5)
Calcium: 9.1 mg/dL (ref 8.4–10.5)
Chloride: 101 mEq/L (ref 96–112)
Creatinine, Ser: 1.32 mg/dL (ref 0.4–1.5)
Creatinine, Ser: 1.47 mg/dL (ref 0.4–1.5)
GFR calc Af Amer: 57 mL/min — ABNORMAL LOW (ref >60)
GFR calc non Af Amer: 47 mL/min — ABNORMAL LOW (ref >60)
GFR calc non Af Amer: 53 mL/min — ABNORMAL LOW (ref >60)
Glucose, Bld: 84 mg/dL (ref 70–99)
Sodium: 140 mEq/L (ref 135–145)
Total Bilirubin: 0.6 mg/dL (ref 0.3–1.2)

## 2011-02-10 LAB — DIFFERENTIAL
Blasts: 0 %
Eosinophils Absolute: 0.1 10*3/uL (ref 0.0–0.7)
Eosinophils Relative: 3 % (ref 0–5)
Monocytes Absolute: 0.4 10*3/uL (ref 0.1–1.0)
Monocytes Relative: 8 % (ref 3–12)
Neutro Abs: 2.8 10*3/uL (ref 1.7–7.7)
Neutrophils Relative %: 59 % (ref 43–77)
nRBC: 0 /100 WBC

## 2011-02-10 LAB — POCT CARDIAC MARKERS: CKMB, poc: 1.1 ng/mL (ref 1.0–8.0)

## 2011-02-10 LAB — BASIC METABOLIC PANEL
Calcium: 9.4 mg/dL (ref 8.4–10.5)
Chloride: 107 mEq/L (ref 96–112)
Creatinine, Ser: 1.61 mg/dL — ABNORMAL HIGH (ref 0.4–1.5)
GFR calc Af Amer: 51 mL/min — ABNORMAL LOW (ref >60)
GFR calc non Af Amer: 43 mL/min — ABNORMAL LOW (ref >60)

## 2011-02-10 LAB — CROSSMATCH
ABO/RH(D): A POS
Antibody Screen: NEGATIVE

## 2011-02-10 LAB — CBC
HCT: 28.5 % — ABNORMAL LOW (ref 39.0–52.0)
Hemoglobin: 8.7 g/dL — ABNORMAL LOW (ref 13.0–17.0)
Hemoglobin: 9 g/dL — ABNORMAL LOW (ref 13.0–17.0)
MCHC: 31.5 g/dL (ref 30.0–36.0)
MCHC: 31.7 g/dL (ref 30.0–36.0)
MCV: 62.3 fL — ABNORMAL LOW (ref 78.0–100.0)
MCV: 62.5 fL — ABNORMAL LOW (ref 78.0–100.0)
RBC: 4.3 MIL/uL (ref 4.22–5.81)
RBC: 4.55 MIL/uL (ref 4.22–5.81)
RDW: 39.1 % — ABNORMAL HIGH (ref 11.5–15.5)
RDW: 40 % — ABNORMAL HIGH (ref 11.5–15.5)
WBC: 4.6 10*3/uL (ref 4.0–10.5)

## 2011-02-10 LAB — PROTIME-INR
INR: 1.9 — ABNORMAL HIGH (ref 0.00–1.49)
INR: 2.5 — ABNORMAL HIGH (ref 0.00–1.49)
INR: 4.1 — ABNORMAL HIGH (ref 0.00–1.49)
Prothrombin Time: 29.1 seconds — ABNORMAL HIGH (ref 11.6–15.2)
Prothrombin Time: 43.2 seconds — ABNORMAL HIGH (ref 11.6–15.2)

## 2011-02-10 LAB — RETICULOCYTES
Retic Count, Absolute: 92.8 10*3/uL (ref 19.0–186.0)
Retic Ct Pct: 2.2 % (ref 0.4–3.1)

## 2011-02-10 LAB — LIPID PANEL
Cholesterol: 179 mg/dL (ref 0–200)
LDL Cholesterol: 67 mg/dL (ref 0–99)

## 2011-03-16 NOTE — Discharge Summary (Signed)
NAME:  Anthony [name], Anthony [name] NO.:  [id number]   MEDICAL RECORD NO.:  [id number]          PATIENT TYPE:  INP   LOCATION:  3705                         FACILITY:  [facility]   PHYSICIAN:  Fleet Contras, M.D.    DATE OF BIRTH:  11/30/1935   DATE OF ADMISSION:  07/01/2008  DATE OF DISCHARGE:  07/04/2008                               DISCHARGE SUMMARY   HISTORY OF PRESENT ILLNESS:  This is another admission for this 75-year-  old African American gentleman with past medical history significant for  hemoglobin [place] sickle cell disease with sickle cell anemia, history of  pulmonary embolism on chronic anticoagulation, tobacco abuse with  chronic obstructive pulmonary disease, and systemic hypertension who  came to the emergency room at [facility] with complaints of  weakness, dizziness, chest pain and shortness of breath on mild  exertion.  He admitted having some dark stools a week prior to  presentation, but had been constipated.  He had no abdominal pain, no  nausea, or vomiting.  He denied any nosebleeds or gum bleeds, and his  hemoglobin on admission was 4.1, but he was hemodynamically stable,  because of history of chronic anemia.  His INR on admission was 5.3  which she was markedly supra-therapeutic.  His Coumadin was put on hold.  He was admitted to the hospital for blood transfusion and close  monitoring.   HOSPITAL COURSE:  On admission he was placed on a telemetry bed and  started on IV fluid hydration and was transfused with 3 units of packed  red blood cell transfusion and hemoglobin improved to 8 and has remained  stable.  He received 1 dose of p.o. vitamin K1 mg and his INR has come  down to 1.3 today and the Coumadin has, therefore, been resumed.  His  stool hemoccult was positive x1 but has now cleared up.  His BUN on  admission was 19, creatinine 2.04.  This has now come down to 1.45.  Due  to the patient's requirement for repeat hospitalization and he  lives  alone by himself, was decided that it would best for him to go to a  skilled nursing facility for some rehabilitation prior to returning  home.  He agreed to this.  This is being planned at the moment.  Today  he is feeling much better.  He has been able to ambulate to the hallway  without any chest pain, shortness of breath or dizziness.   PHYSICAL EXAM:  VITAL SIGNS:  He is afebrile.  His vital signs stable  with a blood pressure of 131/80, heart rate of 90, respiratory rate of  19, temperature 98.9 and O2 sats on room air 97%.  CONSTITUTIONAL:  He is mildly pale.  He is not icteric.  He is not  cyanosed.  CHEST:  Clear to auscultation.  HEART:  S1, S2 sounds were heard with no murmurs.  No S3 gallops.  ABDOMEN:  Obese, soft, nontender, no masses.  Bowel sounds are present.  EXTREMITIES:  Shows no edema, no calf tenderness or swelling.   LABORATORY DATA:  Hemoglobin is 8, INR  is 1.3.  Chest x-ray on admission  showed stable chronic blunting of the right costophrenic angle and no  acute cardiopulmonary abnormalities.  Serial CPK and troponin x3 were  negative.  Stool hemoccult was positive.  He is now considered stable  for discharge to skilled nursing facility where he will continue to  receive his Coumadin dosing and INR levels monitored with a goal of  achieving INR 2 to 3.   DISCHARGE DIAGNOSES:  1. Gastrointestinal bleeding secondary to coagulopathy.  2. Anemia of acute blood loss.  3. Sickle cell anemia, chronic.  4. History of pulmonary embolism on chronic anticoagulation with goal      INR of 2 to 3.  5. Systemic hypertension, currently well controlled.  6. Chronic obstructive pulmonary disease, currently stable.   CONDITION ON DISCHARGE:  Stable.   DISPOSITION:  For skilled nursing facility.   DISCHARGE MEDICATIONS:  1. Coumadin 7.5 mg daily.  INR needs to be checked over the next few      days to get to a goal of 2 to 3.  2. Allegra 312 one p.o. b.i.d.  3.  Cartia XT 120 mg daily.  4. Nasacort AQ 1-2 sprays each nostril daily.  5. Spiriva 18 mg 1 inhalation daily.  6. Symbicort 160/4.5 one puff b.i.d.  7. Protonix 40 mg p.o. daily.  8. Albuterol nebs 2.5 mg q.4 hours p.r.n.  9. Vicodin 5/500 one to two q.6 p.r.n. pain.  10.Oxygen via nasal cannula at 2L.   Plan of care has been discussed with him and his questions answered.      Fleet Contras, M.D.  Electronically Signed     EA/MEDQ  D:  07/04/2008  T:  07/04/2008  Job:  [id number]

## 2011-03-16 NOTE — Discharge Summary (Signed)
NAME:  Anthony [name], Anthony [name] NO.:  [id number]   MEDICAL RECORD NO.:  [id number]          PATIENT TYPE:  INP   LOCATION:  4733                         FACILITY:  [facility]   PHYSICIAN:  Fleet Contras, M.D.    DATE OF BIRTH:  May 11, 1936   DATE OF ADMISSION:  05/24/2008  DATE OF DISCHARGE:  05/31/2008                               DISCHARGE SUMMARY   HISTORY OF PRESENTING ILLNESS:  This was another admission for this 75-  year-old African American gentleman with past medical history  significant for hemoglobin [place], sickle cell disease, severe emphysema  with COPD, history of tobacco abuse, multiple pulmonary embolisms,  chronic anticoagulation, allergic rhinitis, and gastroesophageal reflux  disease.  He presented to the emergency room at [facility] with  1 week of cough associated with shortness of breath with mild exertion.  This progressively worsened, and he was coughing with mostly thick white  sputum.  He had chest pain with deep coughing and deep breathing.  He  had no orthopnea, PND, or palpitations.  He had no fevers or chills.  In  the emergency room, he was in moderate-to-severe acute respiratory  distress and did not improve significantly with nebulized albuterol, and  he was therefore admitted to the hospital for further evaluation and  appropriate therapy.   HOSPITAL COURSE:  On admission, his blood pressure was 146/85, heart  rate of 84, respiratory rate of 20, temperature 97.2, and O2 sats 100%  on 2 L.  He was afebrile.  He was anicteric.  He was not cyanosed.  He  was slightly dehydrated.  Chest showed scattered respiratory rhonchi,  and his abdomen was flat, soft, nontender.  Extremities shows no  peripheral edema.  He was alert and oriented x3 with no focal  neurological deficit.  Initial laboratory data showed an INR of 2.7,  retic count was 1%, sodium was 138, potassium 4.0, chloride 105,  bicarbonate 27, BUN of 15, creatinine 1.6, glucose of 125,  white count  was 6, hemoglobin 7.8, hematocrit 25.2, platelet count of 313,000.  EKG  showed normal sinus rhythm with no acute changes.  Chest x-ray showed  chronic hyperinflation with no acute infiltrates or edema.  He was  admitted to a telemetry bed and started on IV fluids as well as p.o.  Zithromax, IV steroids with Solu-Medrol 50 mg q.6 h., nebulized  albuterol 2.5 mg q.4 h. p.r.n., and he was transfused with 1 unit of  packed red blood cells.  His condition improved very slowly.  He was  having more cough with more sputum, but he was still extremely short of  breath with mild exertion, even walking to the bathroom.  His post  transfusion hemoglobin was 9.4.  His INR remained elevated up to 4.2 and  his Coumadin was put on hold while his dose was being adjusted by the  pharmacist.  He was taking intravenous steroids and converted to oral  prednisone, but he remained quite short of breath, mild exertion.  Physical therapy consult was requested for evaluation for possible short-  term rehabilitation after his acute illness.  He was  recommended  initially for skilled nursing facility placement, but the patient  preferred to go home with home health aide and he has opted for this.  He apparently has a friend by the name, Herbert Pun, who will be coming  to stay with him for a few days and help him get settled in at home.  He  is now ambulating in the hallway with 2 L of oxygen via nasal cannula  and his O2 sats are between 90-97% and he has been taught breathing  techniques by the therapist.  Today, he is comfortably sitting at the  edge of the bed,not in acute respiratory or painful distress at rest.  He has minimal cough and no sputum.  He has no chest pain or shortness  of breath at rest.  He is afebrile.  He is well hydrated.  His vital  signs are stable.  His chest is clear to auscultation with no rales,  rhonchi, or wheezes.  Latest laboratory data showed INR 1.2, white count  is  9.9, hemoglobin is 9.6, and platelet count is 276,000.  Sodium is  137, potassium 4.2, chloride 97, bicarbonate of 34, BUN is 36,  creatinine 1.7, and glucose 104.  He is still receiving intravenous  fluids for hydration.  His BUN and creatinine will be repeated in the  morning.  If this is stable, he will be discharged to home.   DISCHARGE DIAGNOSIS:  1. Chronic obstructive pulmonary disease exacerbation.  2. Respiratory failure with oxygen dependence.  3. Sickle cell disease with sickle cell anemia status post transfusion      of 1 unit of packed red blood cells.  4. Renal azotemia improving with intravenous hydration.  5. History of pulmonary embolism on chronic anticoagulation.  6. Deconditioning, now requiring home health aid for assistance.   His discharge medications will be Coumadin 7.5 mg p.o. daily, Allegra  180 mg daily, diltiazem XR 120 mg daily, Protonix 40 mg daily, Spiriva  one inhalation daily, albuterol 2.5 mg q.6 h. p.r.n., prednisone 20 mg  daily to be taken over the next 2 weeks and discontinued.   Follow up will be with me in about 2 weeks and he  is to continue on  home oxygen therapy at 2 L per nasal cannula for mobility and at night  time.  His plan of care has been discussed with him and his questions  have been answered.       Fleet Contras, M.D.  Electronically Signed     EA/MEDQ  D:  05/30/2008  T:  05/31/2008  Job:  [id number]

## 2011-03-16 NOTE — Discharge Summary (Signed)
NAME:  Anthony [name], WOOLSEY NO.:  [id number]   MEDICAL RECORD NO.:  [id number]          PATIENT TYPE:  INP   LOCATION:  4735                         FACILITY:  [facility]   PHYSICIAN:  Fleet Contras, M.D.    DATE OF BIRTH:  09/21/1936   DATE OF ADMISSION:  02/15/2009  DATE OF DISCHARGE:  02/19/2009                               DISCHARGE SUMMARY   HISTORY OF PRESENTING ILLNESS:  Anthony [name] is a 75 year old African  American gentleman with past medical history significant for hemoglobin  [place], sickle cell disease, pulmonary embolism on chronic anticoagulation,  chronic obstructive pulmonary disease, and tobacco abuse.  He came to  emergency room at [facility] with complaints of weakness and  increasing shortness of breath on mild exertion.  At the emergency room,  his vital signs were stable.  His initial laboratory data showed a  hemoglobin of 6.7 and he was therefore admitted to the hospital for  severe anemia and acute exacerbation of chronic obstructive pulmonary  disease.   HOSPITAL COURSE:  On admission, the patient received blood transfusion  with 2 units of packed red blood cells.  He also received nebulized  bronchodilators with albuterol and Atrovent and oxygen via nasal cannula  as needed.  CBCs were checked regularly.  His INR was also elevated at  4.1 and his Coumadin dose was adjusted to keep his INR between 2-3.  His  condition continued to improve and on February 19, 2009, he was feeling  much better and was able to ambulate in the hallway without any  shortness of breath or chest pain.  His vital signs were stable.  His  chest was clear to auscultation.  INR was 2.2 and hemoglobin was 9.0.  He was therefore considered stable for discharge home.   DISCHARGE DIAGNOSES:  1. Acute exacerbation of chronic obstructive pulmonary disease.  2. Sickle cell anemia.  3. History of pulmonary embolism with therapeutic INR.   CONDITION ON DISCHARGE:  Stable.   DISPOSITION:  For home.   DISCHARGE MEDICATIONS:  1. Hycodan 5 mL q.4 h. p.r.n.  2. Advair 150/50 one puff b.i.d.  3. Prednisone 20 mg once p.o. daily for 4 days.  4. Coumadin 7.5 mg daily.  5. Protonix 40 mg daily.  6. Spiriva inhaler once a day.  7. Combivent inhaler q.4 h. p.r.n.  8. Cardizem 120 mg CD daily.  9. Allegra 180 mg p.o. daily.   He is to follow up with me in the office in 1 week.  Arrangements were  made for a home health aide and INR check on February 27, 2009, in the  office at 11:30 a.m.  Discharge plan was discussed with him and his  questions were answered.      Fleet Contras, M.D.  Electronically Signed     Fleet Contras, M.D.  Electronically Signed    EA/MEDQ  D:  04/02/2009  T:  04/03/2009  Job:  [id number]

## 2011-03-16 NOTE — H&P (Signed)
NAME:  Anthony [name], Anthony [name] NO.:  [id number]   MEDICAL RECORD NO.:  [id number]          PATIENT TYPE:  INP   LOCATION:  5151                         FACILITY:  [facility]   PHYSICIAN:  Fleet Contras, M.D.    DATE OF BIRTH:  September 05, 1936   DATE OF ADMISSION:  01/27/2008  DATE OF DISCHARGE:                              HISTORY & PHYSICAL   PRESENTING COMPLAINT:  Cough, shortness of breath and pain in the chest  and lower back.   HISTORY OF THE PRESENTING ILLNESS:  The patient is a 75 year old African-  American gentleman with a past medical history of sickle cell disease,  pulmonary embolism,  chronic obstructive pulmonary disease, and tobacco  abuse.  He presented to the emergency room at [facility] with a 73-day history of progressive symptoms of cough associated with shortness  of breath, fevers, chills and cold sweats following an episode of a  recent cold.  His symptoms have worsened to the point where he can only  take about four steps and then has to rest because of shortness of  breath.  He has pain in the chest wall and lower back with coughing;  but, he denies any palpitations, orthopnea or PND.  He has had no  nausea, vomiting, diarrhea or blood in his stools.  There is no blood in  his sputum.  In the emergency room he was noted to be in acute  respiratory distress with diffuse wheezing.  He received multiple doses  of nebulized bronchodilators with albuterol and Atrovent, but his  symptoms did not improve significantly for him to be discharged home.  The chest x-ray is negative for acute infiltrates.   PAST MEDICAL HISTORY:  1. Sickle cell disease.  2. Coronary artery disease.  3. Pulmonary embolism on long-term anticoagulation.  4. Chronic obstructive pulmonary disease.  5. History of tobacco use.   PAST SURGICAL HISTORY:  The past surgical history is noncontributory.   FAMILY HISTORY:  The patient's 78 year old mother; deceased last year.  She had  a history of diabetes, hypertension and coronary artery disease.  He has no siblings.  His father died at the age of 13 from stroke  complications.   SOCIAL HISTORY:  The patient states that he has quit tobacco use.  He  denies any use of alcohol or illicit drugs.   REVIEW OF SYSTEMS:  The review of systems is essentially as above.   PHYSICAL EXAMINATION:  GENERAL APPEARANCE:  On physical examination he  is lying comfortably in the hospital bed and is not in acute respiratory  or painful distress.  He is alert and oriented x3.  HEENT:  Atraumatic and normocephalic.  Oral mucosa is slightly dry.  VITAL SIGNS:  The patient's vital signs show a blood pressure of 147/89,  heart rate of 111, respiratory rate of 20, temperature 98.2, and O2 sat  on room air is 95%.  NECK:  The neck is supple with no elevated JVD.  No cervical  lymphadenopathy.  No carotid bruits.  CHEST:  The chest shows good air entry bilaterally with diffuse  expiratory wheezing or rhonchi.  There are no rales.  HEART:  S1 and S2 are heard with no murmurs and no S3 gallop.  ABDOMEN:  The abdomen is flat, soft and nontender.  No masses.  Bowel  sounds are present.  EXTREMITIES:  The extremities show no edema, calf tenderness or  swelling.  NEUROLOGIC EXAMINATION:  CNS; he is alert and oriented x3 with no  neurological deficits.   LABORATORY DATA:  White count 7.3, hemoglobin 9.1, hematocrit 30.1 and  platelet count 314,000.  INR 4.1.  Sodium 134, potassium 4.3, chloride  101, bicarbonate 26, BUN 13, creatinine 1.42, and glucose 89.  AST 24,  ALT 10, alkaline phosphatase 78, total bilirubin 0.7, and albumin  4.5.  Beta-natriuretic peptide 34.  Chest x-ray is reported as showing severe  emphysema with a new patchy bibasilar air space opacity worrisome for  possible pneumonia.   ASSESSMENT:  The patient is a 75 year old African-American gentleman  with chronic obstructive pulmonary disease secondary to tobacco abuse   presenting with cough associated with shortness of breath with mild  exertion.  He is being admitted to the hospital for treatment and  appropriate evaluation.   ADMISSION DIAGNOSES:  1. Chronic obstructive pulmonary disease with acute exacerbation.  2. Possible pneumonia.  3. History of pulmonary embolism on chronic anticoagulation.  4. Coagulopathy.   PLAN OF CARE:  1. The patient will be admitted to a medical bed.  2. The patient will be placed on IV fluids with half normal saline at      55 mL an hour, nebulized bronchodilators with albuterol and      Atrovent as well as intravenous Solu-Medrol 50 mg every 6 hours.  3. Supplemental oxygen to keep sats above 91%.  4. Coumadin will be put on hold and the INR will be monitored closely.  5. GI prophylaxis with Protonix.  6. Avelox 400 mg by mouth daily.   This plan of care has been discussed with him and his questions have  been answered.      Fleet Contras, M.D.  Electronically Signed     EA/MEDQ  D:  01/27/2008  T:  01/29/2008  Job:  [id number]

## 2011-03-16 NOTE — Discharge Summary (Signed)
NAME:  [name], [name] NO.:  [id number]   MEDICAL RECORD NO.:  [id number]          PATIENT TYPE:  INP   LOCATION:  4735                         FACILITY:  [facility]   PHYSICIAN:  Anthony [name], M.D.    DATE OF BIRTH:  13-Jun-1936   DATE OF ADMISSION:  02/15/2009  DATE OF DISCHARGE:                               DISCHARGE SUMMARY   HISTORY OF PRESENT ILLNESS:  Anthony [name] is a 75 year old African  American gentleman with past medical history significant for sickle cell  anemia, hemoglobin [place], history of pulmonary embolism on chronic  anticoagulation, chronic obstructive pulmonary disease with tobacco  abuse, systemic hypertension, who was admitted via the emergency room at  [facility] where he presented with 1-week history of  progressively worsening shortness of breath associated with cough  productive of yellowish sputum.  He uses oxygen at home but needed to be  using it more frequently recently.  He has also become very weak and  tired and had some chest discomfort but denied any palpitations,  hemoptysis, nausea, vomiting, abdominal pain, or blood in the stools.  At the emergency room, he was not in any acute respiratory or painful  distress.  He did have some pursed lip breathing.  Chest exam showed  prolonged respirations with expiratory rhonchi and wheezes.  Initial  laboratory data showed low hemoglobin at 6.7 with elevated INR of 3.6.  A chest x-ray showed stable emphysema with no acute cardiopulmonary  disease.  He was admitted to the hospital for nebulized bronchodilators,  blood transfusion, and closed monitoring.   HOSPITAL COURSE:  On admission, the patient was started on nebulized  albuterol with Atrovent.  He was also on intravenous Solu-Medrol.  He  was transfused with 2 units of packed red blood cell transfusion.  Coumadin was put on hold and INR was monitored closely.  His condition  continued to improve during the course of  hospitalization.  He is now  ambulant in the hallway without marked distress.  He is still requiring  oxygen therapy.  His latest vital signs shows a blood pressure of  136/71, heart rate of 92, respiratory rate of 20, temperature of 98.1,  and O2 sats on 2 L is 97%.  He is still mildly slightly pale.  He is  well hydrated.  He is not icteric.  His chest shows a good air entry  bilaterally with few respiratory rhonchi.  Abdomen is flat, soft,  nontender.  Extremities showed no edema.  His latest laboratory data  show INR of 1.9, hemoglobin is 9.0, hematocrit 28.5, platelet count of  184, white count is 5.2.  Sodium is 140, potassium 4.4, chloride 102,  bicarbonate of 30, BUN is 10, creatinine 1.32, glucose of 84.  Liver  function tests initially is within normal limits.  He is now considered  improved enough to consider going home in the morning.   DISCHARGE DIAGNOSES:  1. Sickle cell anemia, chronic.  2. Acute exacerbation of chronic obstructive pulmonary disease.  3. History of pulmonary embolism with supratherapeutic INR.  4. Systemic hypertension, currently well controlled.   His discharge  medications will be:  1. Coumadin 7.5 mg daily with a goal INR of 2-3.  2. Allegra-D 12 one p.o. b.i.d.  3. Cartia XT 120 mg daily.  4. Nasacort AQ 1-2 sprays each nostril daily.  5. Spiriva 18 mg 1 inhalation daily.  6. Symbicort 160/4.5 mg 1 puff b.i.d.  7. Protonix 40 mg daily.  8. Albuterol nebulized 2.5 mg q.4 hours p.r.n.  9. Vicodin 5/500 one to two q.6 p.r.n. for pain.  10.Oxygen via nasal cannula at 2 L.   He will follow up with me in the office in about 1-2 weeks.   His condition on discharge is stable, and his disposition is home.      Anthony [name], M.D.  Electronically Signed     EA/MEDQ  D:  02/18/2009  T:  02/19/2009  Job:  [id number]

## 2011-03-16 NOTE — H&P (Signed)
NAME:  LEELYN, JASINSKI NO.:  [id number]   MEDICAL RECORD NO.:  [id number]          PATIENT TYPE:  INP   LOCATION:  2609                         FACILITY:  [facility]   PHYSICIAN:  Fleet Contras, M.D.    DATE OF BIRTH:  Nov 28, 1935   DATE OF ADMISSION:  03/05/2009  DATE OF DISCHARGE:                              HISTORY & PHYSICAL   PRESENTING COMPLAINTS:  Weakness and shortness of breath.   HISTORY OF PRESENT ILLNESS:  This is another admission for this 75-year-  old [place] American gentleman with past medical history significant for  hemoglobin S-C sickle cell disease, pulmonary embolism on chronic  anticoagulation, chronic obstructive pulmonary disease, tobacco abuse,  systemic hypertension.  Current admission is for exacerbation of chronic  obstructive pulmonary disease.  He presented to emergency room at [facility] with 2 days' history of progressively worsening fatigue,  weakness, and shortness of breath.  He stated that he had some headaches  over the weekend and had been taking some Goody's Powder, but denied  using any Motrin, Aleve, Advil, or aspirin.  He did eat some lima beans,  lettuce, and broccoli even though he knew he should not have with his  Coumadin.  He was taking Coumadin 5 mg daily as his dose had been  reduced from 7.5 mg when his last INR check in the office last week was  3.2.  He did not have any chest pain and had some dry cough without  sputum production or hemoptysis.  He started having some dark stools  last night and some soreness in his abdomen.  He did not have any  nausea, no vomiting of blood, and did not pass any bright red blood per  rectum.  On evaluation at the emergency room, his vital signs were  stable with blood pressure in the 130s/80s, he was tachycardic with  heart rate in the 100s.  He was alert and oriented, not in any acute  respiratory or painful distress.  His chest was clear to auscultation.  Abdomen was  vaguely tender, but no guarding or rebound tenderness and no  masses.  Bowel sounds were present.  Stool guaiac was positive and the  laboratory data showed a hemoglobin of 5.9 and INR of 7.2.  He was  therefore thought to have gastrointestinal bleeding due to coagulopathy  related to his anticoagulation and he has been admitted to the hospital  for gastrointestinal bleeding and monitoring of his anticoagulation.   PAST MEDICAL HISTORY:  1. History of pulmonary embolism on long-term anticoagulation.  2. Sickle cell disease, hemoglobin S-C.  3. Chronic obstructive pulmonary disease.  4. History of tobacco abuse, apparently now quit.  5. Previous history of chest pain, but cardiac cath in 2005 was      negative for coronary artery disease.   MEDICATION HISTORY:  He was on Coumadin 5 mg once a day, Allegra-D 12  one p.o. b.i.d., diltiazem extended release 120 mg daily, Nasacort AQ 1-  2 sprays each nostril daily, Spiriva 18 mg 1 inhalation daily, Symbicort  160/12.5 one puff b.i.d., Protonix 40 mg daily, nebulized  albuterol 2.5  mg q.4 h. p.r.n., Vicodin 5/500 one to two q.6 p.r.n. for pain, oxygen  via nasal cannula at 2 L.   ALLERGIES:  He has no known drug allergies.   FAMILY AND SOCIAL HISTORY:  He currently lives alone, but has a son that  checks on him.  He denies any use of alcohol or illicit drugs and  recently quit tobacco use.  His parents are deceased, but there is a  family history of diabetes, hypertension, and coronary artery disease.  He had no siblings.   REVIEW OF SYSTEMS:  Essentially as above.   PHYSICAL EXAMINATION:  GENERAL:  He is lying comfortably in the hospital  bed not in acute respiratory or painful distress.  He is pale, not  icteric.  He is not cyanosed.  He is well hydrated.  NECK:  Supple with no elevated JVD.  No cervical lymphadenopathy.  No  carotid bruit.  VITAL SIGNS:  Blood pressure is 138/86, respiratory rate is 20, heart  rate is 102 regular  and O2 sats on 2 L of nasal cannula is 96%.  CHEST:  Good air entry bilaterally with no rales, rhonchi, or wheezes.  HEART:  S1 and S2 are heard.  No murmurs.  No S3 gallops.  ABDOMEN:  Full, soft, nontender, no masses.  Bowel sounds are present.  EXTREMITIES:  No edema, calf tenderness, or swelling.  CNS:  She is alert and oriented x3 with no focal neurological deficits.   LABORATORY DATA:  His white count is 7.5, hemoglobin 5.7, hematocrit  18.2, platelet count of 218.  Urinalysis essentially is negative.  His  reticulocyte percent is 4%.  His lipase is normal at 44.  His sodium is  136, potassium 4.0, chloride 105, bicarbonate of 25, BUN is 20,  creatinine 1.54, glucose of 97.  Liver function tests essentially is  within normal limits.  His albumin is 3.5.  BNP is less than 30.  Stool  Hemoccult is positive.  D-dimer is less than 0.22.  His PT is 69.3, INR  is 7.2, PTT is 45.  Chest x-ray shows emphysematous changes without  acute cardiopulmonary disease.   ASSESSMENT:  This another admission for this 75-year [place] American  gentleman with multiple medical problems mostly related to chronic  obstructive pulmonary disease and anticoagulation for pulmonary  embolism.  He is being admitted at this time with gastrointestinal  bleeding related to coagulopathy resulting in symptomatic anemia.   ADMISSION DIAGNOSES:  1. Gastrointestinal bleeding most likely related to coagulopathy on      Coumadin therapy, but likely aggravated by dietary intake of agents      that wound potentiate Coumadin activity resulting in elevated INR.      He has had colonoscopy done in the past, but results currently are      not available and there is no history of any gastrointestinal      lesions.  We will therefore try and normalize his INR to      therapeutic range of 2-3 and if bleeding continues, we will then      get Gastroenterology consult for further evaluation of his GI      tract.  2. Symptomatic  anemia due to gastrointestinal blood loss.  His      baseline hemoglobin is about 8-9 and therefore he will be      transfused to about this range.  3. Coagulopathy due to interactions of dietary intake with Coumadin  therapy.  Coumadin will be put on hold and he has been given      vitamin K 5 mg in the emergency room and currently is receiving      fresh frozen plasma.  His INR will be monitored closely with a goal      of getting it within therapeutic range of 2.2 to 3.   PLAN OF CARE:  He is currently in the step-down unit.  He will be here  until his INR and hemoglobin stabilizes.  Continue IV Protonix 40 mg  daily, IV fluids, transfuse FFP x2 units, fresh frozen plasma, packed  red blood cell x2 units, monitor CBCs q.6 h. and INR.  His blood  pressure medication will be put on hold.  He will be given pain  medication with IV morphine 1-2 mg q.4 h. p.r.n., will receive one dose  of IV Lasix 40 mg between transfusions.  CT scan of the abdomen and  pelvis will be performed if his abdominal pain worsens or his condition  deteriorates.  This plan of care has been discussed with him and his  questions answered.     Fleet Contras, M.D.  Electronically Signed    EA/MEDQ  D:  03/05/2009  T:  03/06/2009  Job:  [id number]

## 2011-03-16 NOTE — Discharge Summary (Signed)
NAME:  Anthony, [name] NO.:  [id number]   MEDICAL RECORD NO.:  [id number]          PATIENT TYPE:  INP   LOCATION:  4740                         FACILITY:  [facility]   PHYSICIAN:  Fleet Contras, M.D.    DATE OF BIRTH:  1936/03/21   DATE OF ADMISSION:  03/05/2009  DATE OF DISCHARGE:  03/12/2009                               DISCHARGE SUMMARY   HISTORY OF PRESENT ILLNESS:  This is another admission for this 75-year-  old African American gentleman with past medical history significant for  hemoglobin [place] sickle cell disease; pulmonary embolism, on chronic  anticoagulation; chronic obstructive pulmonary disease; tobacco abuse;  and systemic hypertension.  Please see the dictated H and P for full  details of presentation.   In summary, he presented to the emergency room at [facility]  with 2 days history of progressive fatigue, weakness, and shortness of  breath.  He had some headaches over the weekend and had been taking some  Goody Powder, which contains an NSAID, but denied using any Motrin,  Aleve, Advair, or aspirin.  He ate some lima beans, lattice, broccoli  even though he knew that could affect his Coumadin levels and he had  been taking his dose of Coumadin of 7.5 mg daily.  He denied having any  chest pain or palpitations, but has some dry cough and shortness of  breath, but no hemoptysis and no sputum production.  He is really having  some dark stools the night prior to presentation with some soreness in  his abdomen, but there was no passage of bright red blood or vomiting of  blood.  Evaluation at the emergency room, his vital signs were stable.  He was not in any acute respiratory or painful distress.  He was  slightly tachycardic with heart rate in the 100s.  His abdomen was not  overtly tender.  His stool guaiac was positive, and his initial  hemoglobin was 5.9 and INR was 7.2.  He was therefore admitted to the  Step-Down Unit of the hospital,  transfused with 2 units of packed red  blood cells.  He received 2 units of fresh frozen plasma and 5 mg of IV  vitamin K.   HOSPITAL COURSE:  His bleeding subsided and he became more stable, and  he was moved to a regular floor.  His blood pressure medications were  put on hold on admission, but these were slowly resumed.  Once his blood  pressure were normal, there was no other source of bleeding.  His INR  was monitored closely and Coumadin was resumed per pharmacy dosing.  He  was also on subcutaneous Lovenox bridge while waiting for INR to become  therapeutic.  He became stronger and was able to ambulate in the  hallway.  As efforts were made for consideration of transferring him to  an assisted living facility, initially he agreed to this, but later  changed his mind and wanted to return home to try and live by himself  with the help of Home Health Services.  He is not tolerating full diet  with regular bowel  movements, soft and normal collar.   VITAL SIGNS:  Today, shows a blood pressure of 120/62, heart rate of 67,  respiratory rate of 18, temperature 97.8, and oxygen saturation on 2 L  of nasal cannula is 94%.  HEENT:  He is not pale.  He is not icteric.  He is not cyanosed.  He is  well hydrated.  NECK:  Supple with an elevated JVD.  CHEST:  Good air entry bilaterally with no rales, rhonchi, or wheezes.  HEART:  Sounds 1 or 2 are heard with no murmurs.  ABDOMEN:  Full, soft, and nontender.  No masses.  Bowel sounds are  present.  EXTREMITIES:  No edema, calf tenderness, or swelling.   LATEST LABORATORY DATA:  White count of 5.9, hemoglobin 9.6, hematocrit  29, and platelet count of 189.  Sodium is 140, potassium 4.3, chloride  104, bicarbonate of 29, BUN is 13, creatinine is 1.3, and glucose is 96.  His liver function test is within normal limits.  His D-dimer level was  less than 0.22.  Beta natriuretic peptide was less than 30.  His  reticulocyte count was 4.0.  Chest  x-ray was reported as showing changes  of COPD without acute abnormality.  His INR today is 1.9.  He is now  stable for discharge home today.   DISCHARGE DIAGNOSES:  1. Gastrointestinal bleeding.  2. Coagulopathy due to supratherapeutic INR.  3. Symptomatic anemia due to acute blood loss.  4. Chronic obstructive pulmonary disease with mild exacerbation.   PLAN OF CARE:  He will be discharged home with Home Health Services.   CONDITION ON DISCHARGE:  Stable.   DISPOSITION:  For home.   DISCHARGE MEDICATIONS:  1. Coumadin 7.5 mg Monday through Thursday and 5 mg Friday through      Sunday.  2. Allegra 1 p.o. b.i.d.  3. Cartia XT 120 mg daily.  4. Nasacort AQ 1-2 sprays each nostril daily.  5. Spiriva 18 mcg 1 inhalation daily.  6. Symbicort 160/4.5 mg 1 puff b.i.d.  7. Protonix 40 mg daily.  8. Albuterol nebulizer 2.5 mg q.4 h. p.r.n.  9. Vicodin 5/500 one to two q.6 h. p.r.n. for pain.  10.Oxygen via nasal cannula at 2 L.   FOLLOWUP:  He will follow up with me in the office in about 2 days.  He  will also follow up with the Coumadin Clinic at the [facility] for monitoring of his INR.  This  discharge plan will be discussed with him and his questions answered.      Fleet Contras, M.D.  Electronically Signed     EA/MEDQ  D:  03/12/2009  T:  03/13/2009  Job:  [id number]

## 2011-03-19 NOTE — Consult Note (Signed)
NAME:  Anthony [name], Anthony [name] NO.:  [id number]   MEDICAL RECORD NO.:  [id number]          PATIENT TYPE:  INP   LOCATION:  4733                         FACILITY:  [facility]   PHYSICIAN:  Althea Grimmer. Santogade, M.D.DATE OF BIRTH:  12-15-1935   DATE OF CONSULTATION:  10/15/2004  DATE OF DISCHARGE:                                   CONSULTATION   Mr. Pulsifer is a 75 year old male who I am asked to see for guaiac-positive  stool, possible melena, and anemia.  He has reported sickle cell disease and  is chronically anemic.  He was admitted to the hospital on December 8  complaining of chest pain.  Coronary angiography did not reveal any coronary  disease.  Venous Dopplers did not reveal any peripheral clots; however, a  V/Q scan was consistent with bilateral pulmonary emboli.  He has a prior  history of pulmonary emboli in 2002.  Currently, he is on heparin and  beginning coumadinization; however, his prothrombin time is 17.1 with an INR  of 1.6 today.  He reports to me that for the last three days, his stool has  looked almost blackish.  He denies any current abdominal pain.  He says he  had a little bit of low abdominal cramping earlier in the week.  He has no  dysphagia, nausea, heartburn, vomiting, or bright red hematochezia, which  has been stable.   PAST MEDICAL HISTORY:  Pertinent for sickle cell disease, COPD, cigarette  abuse, hypercoagulable state with lupus anticoagulant antibody positivity  diagnosed in the past, history of congestive heart failure, although his  ejection fraction is 54, and prior history of DVT and recurrent pulmonary  embolism.   CURRENT MEDICATIONS:  Include heparin, Protonix, Coumadin, Diltiazem,  fluticasone, Claritin.   ALLERGIES:  None reported.   FAMILY HISTORY:  Negative for ulcer disease or colorectal neoplasia.   SOCIAL HISTORY:  Nondrinker.  Smokes over a pack of cigarettes per day.   REVIEW OF SYSTEMS:  As above, otherwise  negative.   PHYSICAL EXAMINATION:  VITAL SIGNS:  Afebrile.  Blood pressure 118/64, pulse  104 and regular.  GENERAL:  He is a well-developed and well-nourished adult male in no acute  distress.  SKIN:  Normal.  HEENT:  Eyes anicteric.  Oropharynx unremarkable.  NECK:  Supple without thyromegaly.  There is no cervical or inguinal  adenopathy.  LUNGS:  Chest sounds clear.  HEART:  Regular rate and rhythm without murmurs.  ABDOMEN:  Soft without mass, tenderness, organomegaly.  Bowel sounds are  normal.  RECTAL:  Not performed.  EXTREMITIES:  Without edema.   LABORATORY TESTS:  Hemoglobin 9.9, white blood count 12.8, platelet count  247, prothrombin time 17.1, INR 1.6.  Creatinine normal.  Fecal occult blood  testing positive.   IMPRESSION:  A 75 year old male with a history of deep venous thrombosis and  recurrent pulmonary emboli, who requires lifelong anticoagulation and has  guaiac-positive stool.  He also reports that his stools appeared melenic in  the last three days.  Both upper and lower endoscopy is indicated to rule  out significant peptic or neoplastic problems or  vascular lesions.  The  procedures of upper and lower endoscopy are reviewed with the patient in  terms of technique, preparation and risks of complications, including  bleeding and perforation.  He agrees to proceed.  They will be scheduled for  tomorrow morning.  Please see the orders.       PJS/MEDQ  D:  10/15/2004  T:  10/15/2004  Job:  [id number]   cc:   Fleet Contras, M.D.  [address]  [place]  [place] [address]  Fax: [phone]

## 2011-03-19 NOTE — Discharge Summary (Signed)
NAME:  Anthony [name], Anthony [name] NO.:  [id number]   MEDICAL RECORD NO.:  [id number]                   PATIENT TYPE:  INP   LOCATION:  3737                                 FACILITY:  [facility]   PHYSICIAN:  Norwood Levo, MD               DATE OF BIRTH:  1936/07/27   DATE OF ADMISSION:  08/26/2003  DATE OF DISCHARGE:  09/02/2003                                 DISCHARGE SUMMARY   PREVIOUS PRIMARY CARE PHYSICIAN:  Mina Marble, M.D.   NEW PRIMARY CARE PHYSICIAN:  Fleet Contras, M.D.   [facility] PHYSICIANS:  Dr. James Ivanoff and Dr. Felicity Pellegrini.   [facility] in [facility] [place] case assistant is Renea Ee, at [phone].   ADMITTING ATTENDING PHYSICIAN:  Norwood Levo, MD   CHIEF COMPLAINT:  Shortness of breath, dyspnea on exertion with chest pain.   HISTORY OF PRESENT ILLNESS:  A 75 year old African American with a past  medical history for DVT and bilateral pulmonary embolus, CAD, and CHF  presumptively with an extensive history of tobacco smoking. Pulmonary  embolus diagnosed in 2002 and the patient was started on Coumadin at that  time, and has been noncompliant since then. He presents with 48 hours of  upper respiratory-like symptoms, fevers, chills, dry cough which was  nonproductive, dyspnea on exertion, and increasing shortness of breath. He  has chest pain with inhalation consistent with pleuritic pain. He denies any  nausea, vomiting, diarrhea, emesis, hematemesis, melena, or hematochezia. He  has not traveled recently and has not undergone any type of long-term  immobilization or leg trauma. He denies any neurological changes. He denies  any sick contact at the time of admission.  In the emergency department  he  was thought to have CHF, given Lasix, and BNP was less than 30, thus negates  presumptive diagnosis.  Spiral chest CT repeated and appeared to be positive  for bilateral emboli. The patient was started on IV heparin protocol  and  Coumadin per protocol.   PAST MEDICAL HISTORY:  1. Sickle cell anemia.  2. CAD, CHF (?)  3. Bilateral pulmonary emboli in November 2002.  4. Left lower lobe community-acquired pneumonia with pleural effusion.  5. COPD.  6. Cardiolite in 2002 with an ejection fraction of 59 with minimal septal     hypokinesis at that time.   PAST SURGICAL HISTORY:  None.   MEDICATIONS AT THE TIME OF ADMISSION:  None.   FAMILY HISTORY:  Mother is known to have sickle cell trait, had a stroke,  and died at age 73 from its complications.  Mother had a history of  diabetes, sickle cell anemia, CAD, and hypertension as does maternal aunts.  The patient has no siblings.   SOCIAL HISTORY:  Denies any alcohol or street drugs, but smokes a pack a day  and has been doing so for the last 27 years.   REVIEW OF SYSTEMS:  As per  HPI.   PHYSICAL EXAMINATION ON ADMISSION:  As per Dr. Joellyn Rued evaluation in the  history and physical.   LABORATORY DIAGNOSTICS:  Laboratories were significant only for WBC of 13.3,  hematocrit 34, BNP less than 30, and cardiac enzymes that were negative. ABG  was 7.46/36/26. Chest CT per pulmonary embolus protocol was positive for  bilateral emboli.  An EKG showed LVH, LAD, and T mitrale.   HOSPITAL COURSE:  1. The patient was admitted to telemetry floor for bilateral emboli. He     underwent hypercoagulable workup that was negative with the exception of     lupus anticoagulant antibodies. He was placed on IV heparin protocol and     Coumadin within 72 hours from the time of admission. He was MSIR and     morphine for his pleuritic pain, and he was treated empirically for COPD     exacerbation as well as potential community-acquired pneumonia. He was     afebrile throughout his hospitalization. Echocardiogram was repeated     showing a left ventricular ejection fraction of 50% to 55%, no thrombus     present. Increased right ventricular mild dilatation, but no valvular      disease. The chest x-ray following admission showed chronic interstitial     changes consistent with COPD, but no acute disease and no flagrant     effusions, although very small infiltrates are seen in the right greater     than left lobe.  The patient's pulse oximetry improved throughout his     hospitalization as dual nebulizers and Advair were added to his regimen,     and cardiac enzymes were negative throughout.  His lower extremities on     duplex scan were negative for DVT.  Urinalysis was also negative.  2. Oncology.  Weight loss on admission the patient describes as 25 pounds of     weight loss with nocturnal diaphoresis. Chest CT shows no chest mass.     Abdominal CT was performed without any type of lymphadenopathy or     abdominal mass with normal spleen, pancreas, and liver.  PSAs in the     norm.  Markers CA-125 and CEA were also normal. I believe that this     phenomenon of weight loss and cachexia is probably secondary to COPD,     hypercatabolism due to the work of breathing, but certainly continued     weight loss of this magnitude should be followed by the patient's new     PCP.  The patient's family wanted physical therapy and is deemed to be a     good candidate for home. Case management is invoked for some home health     aid since the patient is now convalescing and the results of the sole     caregiver of a very ill elderly mother.  He is also given Coumadin     education. He is encouraged to re-establish a new PCP since he will no     longer be following with Dr. Petra Kuba and is now set for follow up as a     sickle cell clinic here in [place].   DIAGNOSES ON DISCHARGE:  1. Bilateral pulmonary embolus.  2. Hypercoagulable status with lupus anticoagulant antibodies present.  3. Sickle cell anemia.  4. Chronic obstructive pulmonary disease with baseline emphysema. 5. Chronic obstructive pulmonary disease catabolism and weight loss.  6. Chronic anemia  requiring two units of packed red blood cells transfused     (  anemia workup pending at this time with iron panel, ferritin,     reticulocyte count, and erythropoietin level pending).  7. Tobacco abuse.  8. Presumptive community-acquired pneumonia.   MEDICATIONS ON DISCHARGE:  1. Avelox 400 mg p.o. daily times six days.  2. Advair 250/50 one puff b.i.d.  3. Coumadin 5 mg p.o. daily.  4. Combivent two puffs q.i.d. p.r.n.  5. Percocet (7.5/325) two tabs p.o. q.6h. p.r.n. for flutter and pain.   DIETARY RESTRICTIONS:  See Coumadin dietary restrictions.   SPECIAL INSTRUCTIONS:  Please stop smoking.   FOLLOWUP INSTRUCTIONS:  1. Follow up with Dr. Concepcion Elk ASAP to establish new PCP and have Coumadin PT-     INR checked this Tuesday with dose adjustment done by him.  2. Follow up with the [facility] ASAP with caseworker, Renea Ee, at     [phone].                                                Norwood Levo, MD    APM/MEDQ  D:  08/31/2003  T:  08/31/2003  Job:  [id number]   cc:   Fleet Contras, M.D.  [address]  [place]  [place] [address]  Fax: [phone]

## 2011-03-19 NOTE — H&P (Signed)
NAME:  KOL, CONSUEGRA NO.:  [id number]   MEDICAL RECORD NO.:  [id number]          PATIENT TYPE:  INP   LOCATION:  3707                         FACILITY:  [facility]   PHYSICIAN:  Fleet Contras, M.D.    DATE OF BIRTH:  Nov 17, 1935   DATE OF ADMISSION:  02/12/2007  DATE OF DISCHARGE:                              HISTORY & PHYSICAL   PRESENTING COMPLAINTS:  Shortness of breath, dyspnea on exertion and  cough.   HISTORY OF PRESENTING ILLNESS:  Mr. Groleau is a 75 year old African  American gentleman with a past medical history significant for sickle  cell anemia, a history of pulmonary embolism and a history of chronic  obstructive pulmonary disease.  He presented to the emergency room at  [facility] with a 7-day history of progressive symptoms of  cough productive of yellowish sputum associated with shortness of breath  and dyspnea on mild exertion.  He stated that he gets out of breath just  by taking a few steps.  He denied having any chest pain, no orthopnea,  PND or palpitations.  He had no nausea, vomiting, diarrhea or blood in  his stools.  His sputum did not have any blood in it.  He had had some  fever and chills and sweating.  At the emergency room, he was noted to  be in mild respiratory distress with diffuse wheezes.  He received some  nebulized bronchodilators with some improvement but not sufficient to be  discharged home.  Chest x-ray was negative for acute infiltrates.  He is  being admitted to the hospital for further evaluation and nebulized  therapy.   PAST MEDICAL HISTORY:  1. Sickle cell anemia.  2. Coronary artery disease/congestive heart failure by history.  3. Pulmonary embolism in November 2002.  4. History of chronic obstructive pulmonary disease related to tobacco      abuse.  5. History of pneumonia in the past.   PAST SURGICAL HISTORY:  Noncontributory.   FAMILY AND SOCIAL HISTORY:  He currently cares for his 37 year old  mother who lives with him.  She has a history of diabetes, hypertension,  coronary artery disease.  He had no siblings.  His father died at age 6  from complications of stroke.  His stated that he has quit tobacco use  but it seems like he still smokes on and off.  He denies any use of  alcohol or street drugs.   REVIEW OF SYSTEMS:  Essentially as above.  He has no dysuria, frequency,  hematuria or nocturia.   PHYSICAL EXAMINATION:  GENERAL:  He is lying on the hospital bed, not in  acute respiratory or painful distress at this time.  He is slightly  pale.  He is not icteric.  He is not cyanosed.  He is well-hydrated.  VITAL SIGNS:  Initial vital signs show a blood pressure of 151/87, heart  rate of 107, respiratory rate of 20, temperature 98, O2 sat on room air  95%.  HEENT:  His pupils are equal, round and reactive to light and  accommodation.  NECK:  Supple with no elevated JVD, no  cervical lymphadenopathy, no  carotid bruits.  CHEST:  Shows good air entry bilaterally.  Negative crackles or rhonchi.  There are no respiratory wheezes or rales.  HEART:  Heart sounds are well heard with no murmurs.  ABDOMEN:  Flat, soft, nontender, no masses.  Bowel sounds are present.  EXTREMITIES:  Shows no edema, no cyanosis or clubbing.  Peripheral  pulses are present bilaterally.  SKIN:  Normal.  NEUROLOGIC:  He is alert and oriented x3 with no focal neurological  deficits.   INITIAL LABORATORY DATA:  INR of 2.8.  White count is 5.4, hemoglobin  8.8, hematocrit 29, platelet count 335.  Sodium is 135, potassium 4.7,  chloride 101, bicarbonate of 29, BUN 8, creatinine 1.56, glucose of 97.  AST is 27, ALT 13, alkaline phosphatase 70, total bilirubin 0.8, albumin  is 3.7, calcium is 9.2.  Chest x-ray is reported as showing changes of  COPD with no acute infiltrates.   ASSESSMENT:  Mr. Kamara is a 75 year old African American gentleman  with a past medical history significant for sickle cell  disease, a  history of chronic pulmonary embolism, on chronic anticoagulation,  noncompliance with office follow-up appointment and tobacco abuse  resulting in chronic obstructive pulmonary disease, who presents to the  emergency room with cough productive of yellowish sputum associated with  shortness of breath and dyspnea on exertion.  Workup so far reveals  evidence of acute exacerbation of chronic obstructive pulmonary disease.  He will be admitted to the hospital for close monitoring and appropriate  therapy.   PLAN:  The plan of care will be to admit him to a telemetry bed.  He  will be on a 2-gram sodium diet.  Vital signs q. 4 hours.  He will be on  antibiotics with azithromycin 500 mg 1 time followed by 250 mg daily.  He will receive nebulized bronchodilators with albuterol and Atrovent as  well as intravenous Solu-Medrol 50 mg q. 6 hourly.  Supplemental oxygen  will be given to keep his saturations above 90%.  He will receive pain  medications with Vicodin 500/5 one p.o. q. 6 p.r.n. as needed.  DVT and  GI prophylaxis will be given.  The plan of care discussed with him and  his questions answered.      Fleet Contras, M.D.  Electronically Signed     EA/MEDQ  D:  02/13/2007  T:  02/13/2007  Job:  [id number]

## 2011-03-19 NOTE — Cardiovascular Report (Signed)
NAME:  Anthony [name], Anthony [name] NO.:  [id number]   MEDICAL RECORD NO.:  [id number]          PATIENT TYPE:  INP   LOCATION:  4733                         FACILITY:  [facility]   PHYSICIAN:  Ricki Rodriguez, M.D.  DATE OF BIRTH:  December 05, 1935   DATE OF PROCEDURE:  10/09/2004  DATE OF DISCHARGE:                              CARDIAC CATHETERIZATION   PROCEDURE:  1.  Left heart catheterization.  2.  Selective coronary angiography.   INDICATIONS:  This 75 year old black male with recurrent left-sided chest  pain, neck pain, left arm pain, had abnormal cardiac enzymes.   APPROACH:  Right femoral artery using 5 French sheath and catheters.   COMPLICATIONS:  None.   HEMODYNAMIC DATA:  The left ventricular pressure was 114/68 and aortic  pressure was 114/9.   Perclose suture applied without difficulty.  However, additional external  sutures were taken and then the artery was secured.   CORONARY ANATOMY:  The left main coronary artery was unremarkable.   Left anterior descending coronary artery:  The left anterior descending  coronary artery and diagonal vessels were also unremarkable.   Left circumflex coronary artery:  The left circumflex coronary artery and  its obtuse marginal branch was also unremarkable and right coronary artery  was dominant and was also unremarkable.   IMPRESSION:  1.  Normal coronaries.  2.  Noncardiac chest pain.   RECOMMENDATIONS:  This patient will continue medical therapy and will also  receive 1 g of IV Ancef and calcium channel blocker therapy.      Ajay   ASK/MEDQ  D:  10/09/2004  T:  10/10/2004  Job:  [id number]

## 2011-03-19 NOTE — Discharge Summary (Signed)
[facility]  Patient:    Anthony [name], Anthony [name] Visit Number: [id number] MRN: [id number]          Service Type: MED Location: [phone] Attending Physician:  Eino Farber Dictated by:   Rosanna Randy Su Hilt, N.P. Admit Date:  09/28/2001 Discharge Date: 10/10/2001                             Discharge Summary  ADMISSION DIAGNOSES: 1. Bilateral pulmonary emboli with infarct. 2. Sickle cell disease. 3. Chronic bronchitis with chronic obstructive pulmonary disease.  DISCHARGE DIAGNOSES: 1. Sickle cell crisis with severe pain. 2. Pulmonary infarct at the right base and pulmonary emboli. 3. Pneumonia, left lower lobe, with left small pleural effusion. 4. Sickle cell Winslow disease. 5. Acute bronchitis. 6. Chronic obstructive pulmonary disease.  PROCEDURES:  On October 06, 2001, a stress Cardiolite test done by Anthony [name], M.D.  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 He presented to the ER with a one to two-day history of left chest pain and shortness of breath.  He denied any associated fever, chills, or night sweats, but stated that he had been coughing up whitish-yellow sputum.  He had a chest x-ray which showed bilateral atelectasis and pleural effusion.  He had a spiral CT of the chest that showed bilateral pulmonary emboli.  ALLERGIES:  No known drug allergies.  LABORATORY DATA AND STUDIES:  Venous Dopplers of the lower extremities on September 29, 2001, were negative.  He had a 2-D echocardiogram done on September 29, 2001.  On September 29, 2001, the EKG showed sinus tachycardia and T-wave abnormalities, consider anterior ischemia.  On September 28, 2001, ABG on 2 L showed pH 7.485, pCO2 28.9, pO2 59, and bicarbonate 22.  The CBC on September 28, 2001, showed WBC 13.2, hemoglobin 12.6, hematocrit 36.6, and platelets  170.  The WBC on September 30, 2001, was 14.5, hemoglobin 10.5, hematocrit 30.1, and platelets 186.  On October 10, 2001, the WBC was 8.2, hemoglobin 8.2, hematocrit 24.6, and platelets 442.  Chemistry studies on September 28, 2001, were within normal limits, except for a glucose of 134 and a total protein of 8.4.  On September 28, 2001, the troponin I was 0.42.  On October 09, 2001, the C-reactive protein was 5.  The sputum evaluation on September 30, 2001, showed culture with normal oropharyngeal flora.  HOSPITAL COURSE:  The patient was admitted to a telemetry bed.  He was started on a heparin drip followed by pharmacy protocol.  He was given morphine 3 mg IV for pain and MSIR 2.5-5 mg q.4h. for chest pain.  He was also started on Protonix 40 mg p.o. q.d.  Albuterol nebulizer treatment was also initiated. He had a 2-D echocardiogram and venous Doppler done.  The patient was started on Coumadin protocol per pharmacy.  He was started on Cipro 500 mg p.o. b.i.d. He was also started on Humibid LA p.o. b.i.d.  He had a chest x-ray to follow up on atelectasis and pneumonia.  He had a Cardiolite stress test by Anthony [name], M.D., which was changed to adenosine Cardiolite.  The patient was eventually discontinued on his heparin and Cipro.  CONDITION ON DISCHARGE:  The patient is discharged home in stable condition, which he stated improvement with less cough and  less chest pain and his sputum was clear.  DISCHARGE MEDICATIONS: 1. Coumadin 10 mg tablet p.o. q.d. 2. Vicodin tablet one q.6h. p.r.n. for pain. 3. Humibid LA tablet one b.i.d. for thick sputum. 4. Folic acid 1 mg tablet. 5. Combivent metered dose inhaler one to two puffs q.d. and p.r.n. for cough    and chest tightness.  ACTIVITY:  Ambulate as tolerated.  FOLLOW-UP APPOINTMENT:  Keep appointment with Dr. Paulo Fruit [name] office on Tuesday, October 17, 2001, at 3:30 p.m.  Will need blood tests. Dictated by:   Rosanna Randy.  Su Hilt, N.P. Attending Physician:  Eino Farber DD:  11/10/01 TD:  11/11/01 Job: [id number]

## 2011-03-19 NOTE — Discharge Summary (Signed)
NAME:  Anthony [name], Anthony [name] NO.:  [id number]   MEDICAL RECORD NO.:  [id number]          PATIENT TYPE:  INP   LOCATION:  3707                         FACILITY:  [facility]   PHYSICIAN:  Fleet Contras, M.D.    DATE OF BIRTH:  10-24-1936   DATE OF ADMISSION:  02/12/2007  DATE OF DISCHARGE:  02/13/2007                               DISCHARGE SUMMARY   HISTORY OF PRESENT ILLNESS:  The patient is a 75 year old African-  American gentleman with past medical history significant for sickle cell  disease, chronic obstructive pulmonary disease, history of DVT/pulmonary  embolism who presented to the emergency room at [facility] with  a 1 week history of progressively worsening cough productive of  yellowish sputum associated with shortness of breath and dyspnea on mild  exertion.  He denied having any hemoptysis, no chest pain, orthopnea,  PND or palpitations.  He had no nausea, vomiting, diarrhea or blood in  his stools.  He had some fevers and chills and sweating.  In the  emergency room he was noted to be in mild respiratory distress with a  few diffuse wheezes.  He had received nebulized bronchodilators and his  symptoms did not improve significantly.  In the emergency room his blood  pressure was 151/87, heart rate 107, respiratory rate of 20, temperature  98 degrees Fahrenheit and O2 sats on room air was 95%.  He was not, he  was slightly pale, he was not icteric, he was not cyanosed.  He was well-  hydrated.  His chest showed good air entry bilaterally with expiratory  wheezes but no rales.  Heart sounds were heard with no murmurs.  The  abdomen was flat, nontender, no masses.  Bowel sounds were present.  Extremities showed no edema, no cyanosis or clubbing.  Neurologically he  was intact with no focal neurological deficits.   LABORATORY DATA:  Included a chest x-ray which was negative for acute  infiltrates.  His white count was 5.4, hemoglobin was 8.8, platelet  count was 335.  His INR was 2.8.  Sodium was 135, potassium 4.7,  chloride 101, bicarbonate of 29, BUN of 8, creatinine 1.56 and glucose  of 97.  Liver function tests were normal.  He was therefore admitted to  the hospital as a case of acute exacerbation of chronic obstructive  pulmonary disease.   HOSPITAL COURSE:  On admission the patient was put on a telemetry bed,  was started on a 2 g sodium diet.  Vital signs were checked q.4 h.  He  was started on antibiotics with azithromycin 500 mg one time, follow up  at 250 mg daily.  He received nebulized albuterol and Atrovent as well  as intravenous Solu-Medrol 50 mg q.6 hourly.  Oxygen supplementation was  given via nasal cannula.  He was on GI and DVT prophylaxis.  His  condition began to improve through the course of admission.  He was able  to ambulate to the bathroom but became quite easily fatigued and short  of breath with mild exertion.  Pharmacy was involved in the monitoring  of his INR through  admission and was stable between 2 to 3.  He had  episodes of headaches, nasal congestion and he was restarted on his  allergy medications with decongestants.  He also received Fioricet for  headaches which improved his symptoms.  He continued to make significant  progress with less cough and less sputum.  He has no hemoptysis.  He has  had no chest pain, orthopnea or PND.  His headaches have improved.  He  has been afebrile.  His vital signs have been stable.  His still has  some scattered few expiratory rhonchi and wheezes but no rales.  Abdomen  is benign.  Extremities showed no edema, no calf tenderness.  CNS, he is  alert, oriented x3 with no focal neurological deficits.  Latest  laboratory data shows INR of 2.6.  He is considered stable for discharge  home in the a.m.   DISCHARGE DIAGNOSIS:  1. Acute exacerbation of chronic obstructive pulmonary disease.  This      has improved with oral antibiotics, steroids and nebulizers,       bronchodilators.  2. Allergic rhinitis.  3. History of deep venous thrombosis and pulmonary embolism on      Coumadin with therapeutic INR.  4. Sickle cell disease which is stable.   DISCHARGE MEDICATIONS:  1. Protonix 40 mg daily.  2. Azithromycin 250 mg daily for 5 days.  3. Coumadin 4.5 mg daily.  4. Cardizem CD 120 mg daily.  5. Nicotine patch 21 mg daily.  6. Albuterol MDI 2 puffs q.6 h p.r.n.  7. Spiriva 18 mg MDI 1 inhalation daily.  8. Allegra D 12 one p.o. b.i.d.  9. Prednisone 20 mg two daily for two more days then one daily for 7      days.   FOLLOWUP:  He is to follow up with me in 1 week in the office.  He is  advised to quit tobacco use and to increase activity slowly.  This plan  of care has been discussed with him and his questions answered.      Fleet Contras, M.D.  Electronically Signed     EA/MEDQ  D:  02/15/2007  T:  02/15/2007  Job:  [id number]

## 2011-03-19 NOTE — Discharge Summary (Signed)
NAME:  SERGEY, ISHLER NO.:  [id number]   MEDICAL RECORD NO.:  [id number]          PATIENT TYPE:  INP   LOCATION:  3010                         FACILITY:  [facility]   PHYSICIAN:  Fleet Contras, M.D.    DATE OF BIRTH:  Mar 19, 1936   DATE OF ADMISSION:  10/08/2004  DATE OF DISCHARGE:  10/20/2004                                 DISCHARGE SUMMARY   PRESENTATION:  Mr. Anthony [name] is a 75 year old, African-American  gentleman with past medical history significant for hemoglobin [place] sickle  cell disease, chronic obstructive pulmonary disease, coronary artery  disease, and a history of bilateral pulmonary embolism, who was admitted via  the emergency room at [facility] with a two-day history of  progressive shortness of breath and left-sided pleuritic chest pain.  He  stated he has been smoking more cigarettes than usual recently, was exposed  to the cold two days prior to presentation.  He has been having progressive  nasal congestion, postnasal drip, cough productive of thick yellow sputum,  and shortness of breath.  These symptoms progressed until he could hardly  speak.  Accordingly, he decided to come to the emergency room.  He had no  hemoptysis, orthopnea, or PND.  He had no fevers or chills, no palpitations,  nausea, or diaphoresis.  At the emergency room, he received bronchodilator  treatment, but his symptoms did not improve.  Chest x-ray showed changes of  chronic obstructive pulmonary disease with no acute infiltrates of pulmonary  edema.  He was therefore admitted with the diagnosis of acute exacerbation  of chronic obstructive pulmonary disease, to rule out acute coronary  syndrome, sickle cell chest syndrome, and renal insufficiency, as his BUN  was 22, creatinine 2.1.   HOSPITAL COURSE:  On admission, the patient was placed on intravenous  Rocephin, __________ steroids, nebulized bronchodilators, intravenous  heparin.  CK-MB was 7.2, troponin was  0.24.  Cardiology evaluation was  requesting the following morning to rule out coronary artery disease.  The  patient was seen by Dr. Algie Coffer, who proceeded to perform a left heart  catheterization and coronary angiography.  This revealed one-vessel coronary  artery disease with normal LAD and left circumflex, dilated right ventricle,  and moderate to severe pulmonary hypertension.  He had been on long-term  anticoagulation with Coumadin at home, and his INR was subtherapeutic.  He  was on IV heparin until Coumadin was therapeutic.  This was monitored  closely by the pharmacist.  At one point, he had a slight drop in his  hemoglobin.  Stool guaiac was positive.  He underwent an upper GI endoscopy  which was essentially normal and a colonoscopy which revealed some polyps,  which were later reported to be adenomatous.  He received pain medication  for his pleuritic chest pain.  His condition continued to improve, and he  became ambulant.  His INR improved to the __________.  On October 20, 2004,  he had no new complaints.  His vital signs were stable.  Chest was clear.  Abdomen was benign.  No focal neurological deficits.  His INR was 1.9.  Hemoglobin  was 8.4.  He was considered stable for discharge home.   CONDITION ON DISCHARGE:  Stable.   DISPOSITION:  Home.   FOLLOWUP:  With Dr. Fleet Contras in one week.   DISCHARGE MEDICATIONS:  1.  Advair 250/50, 1 puff b.i.d.  2.  Allegra-D 12 one p.o. b.i.d.  3.  Protonix 40 mg daily.  4.  Cardizem CD p.o. daily.  5.  Nasonex 1 spray each nostril daily.  6.  Combivent MDI 2 puffs q.6 h. p.r.n.  7.  Percocet 5/325, 1-2 p.o. q.6 h. p.r.n.  8.  Coumadin 7.5 mg p.o. daily.  9.  Lovenox 65 mg subcutaneously daily for 5 doses until INR was      therapeutic.  He was to have INR drawn by home health nurse from      [facility] on October 27, 2004, to be faxed to my office for      review.   His plan of care was discussed with the patient.   All his questions were  answered.      EA/MEDQ  D:  12/15/2004  T:  12/15/2004  Job:  [id number]

## 2011-03-19 NOTE — Discharge Summary (Signed)
NAME:  Anthony [name], Anthony [name] NO.:  [id number]   MEDICAL RECORD NO.:  [id number]          PATIENT TYPE:  INP   LOCATION:  5127                         FACILITY:  [facility]   PHYSICIAN:  Fleet Contras, M.D.    DATE OF BIRTH:  [birth year]   DATE OF ADMISSION:  01/27/2008  DATE OF DISCHARGE:  02/03/2008                               DISCHARGE SUMMARY   HISTORY OF PRESENT ILLNESS:  Anthony [name] is a 75 year old African  American gentleman with past medical history significant for sickle cell  disease, pulmonary embolism, chronic obstructive pulmonary disease, and  tobacco abuse.  He is on chronic anticoagulation therapy who presented  to the Emergency Room at [facility] with 5-day history of  progressive symptoms of cough associated with shortness of breath,  fevers, chills, cold sweats following an episode of the recent cold.  There was no blood in his sputum.  In the emergency room, he was noted  to be in acute respiratory distress with diffuse wheeze.  He received  multiple doses of nebulized bronchodilators without improvement.  His  chest x-ray was negative for acute infiltrates, but due to his  symptomatology, he was admitted to the hospital for further treatment.   HOSPITAL COURSE:  On admission, he was on IV fluids with nebulized  bronchodilators with albuterol and Atrovent, intravenous Solu-Medrol 50  mg q.6.  Coumadin was put on hold, because his INR was markedly  elevated.  He was also started on Avelox 400 mg daily.  His symptoms  were very slow to improve.  His hemoglobin on admission was 9.1 but this  dropped to 7.2.  On January 29, 2008, he received 1 unit of packed red  blood cells as well as vitamin K as his INR was 6.2.  The next day his  INR dropped to 1.9 and hemoglobin was 9.3.  His BUN increased to 27 and  creatinine was 1.6, and he received fluids with improvement.  On February 03, 2008, he was feeling much better, unable to ambulate, without  shortness  of breath but needed oxygen.  His vital signs were stable.  His chest was clear to auscultation.  He was considered stable for  discharge home.   DISCHARGE DIAGNOSES:  1. Chronic obstructive pulmonary disease exacerbation.  2. Anemia of sickle cell disease.  3. History of pulmonary embolism on Coumadin.  4. Constipation.   CONDITION ON DISCHARGE:  Stable.   DISPOSITION:  Home.   DISCHARGE MEDICATIONS:  1. Coumadin 7.5 mg daily.  2. Allegra one tablet 180 mg daily.  3. Cartia XT 120 mg daily.  4. Protonix 40 mg daily.  5. Spiriva 1 inhalation daily.  6. Combivent 2 puffs q.6 h. p.r.n.  7. Albuterol nebulizer 1 unit dose q.6 h p.r.n.  8. Prednisone 20 mg to be tapered over 7 days.  9. Senokot 2 p.o. daily p.r.n.  10.Nicotine patch 21 mg daily.      Fleet Contras, M.D.  Electronically Signed     EA/MEDQ  D:  03/13/2008  T:  03/14/2008  Job:  [id number]

## 2011-03-19 NOTE — H&P (Signed)
NAME:  Anthony [name], Anthony [name] NO.:  [id number]   MEDICAL RECORD NO.:  [id number]                   PATIENT TYPE:  INP   LOCATION:  1825                                 FACILITY:  [facility]   PHYSICIAN:  Norwood Levo, MD               DATE OF BIRTH:  Nov 18, 1935   DATE OF ADMISSION:  08/25/2003  DATE OF DISCHARGE:                                HISTORY & PHYSICAL   [facility] DICTATION:   PREVIOUS PRIMARY CARE PHYSICIAN:  Dr. Petra Kuba, presently unassigned.   [facility] PHYSICIANS:  Dr. Arlyn Leak and Dr. Nathaniel Man.   ADMITTING ATTENDING:  Dr. Arna Medici.   CHIEF COMPLAINT:  Shortness of breath, dyspnea on exertion with chest pain.   HISTORY OF PRESENT ILLNESS:  75 year old African-American with past medical  history of a DVT and bilateral PE, CAD/CHF.  Pulmonary embolus diagnosed  first time in 2002, Coumadin started at that time and patient has been  noncompliant with that.  He presents with 48 hours of upper respiratory-like  signs and symptoms with fevers, chills, dry cough that is non productive,  dyspnea on exertion and progressively increasing shortness of breath and  chest pain with inhalation.  The patient denies any nausea, vomiting or  diarrhea, any emesis, hematemesis, any melena or hematochezia.  He denies  any travel with long period of immobilization or lower extremity trauma.  Denies any neurological changes as well.  He denies any sick contacts.  The  patient was seen approximately a week to ten days ago at the [facility] for  sickle cell patients and at that time chest x-ray showed pulmonary effusion.  The patient was to have further work up for suspected pulmonary embolus at  that time because he has had gradual increase in shortness of breath then  intermittently.  At this time in the emergency room he also is evaluated for  CHF with a BNP that is less than 30; the patient is treated empirically with  Lasix.   Follow up spiral chest CT is positive for bilateral pulmonary  emboli.  He is begun on heparin IV protocol per pharmacy in the emergency  room.  The patient also relates a long period of probably months to a year  ot nocturnal diaphoresis in which he has upper extremity sweating in which  he soaks through his pajamas and sheets.  Also describes an approximately 25  to 30 pound weight loss over the last year.  He is still an active smoker  and stopped only last Wednesday when his shortness of breath became  intolerable.   He describes also some low back pain that he describes as being different  from any sickle cell crisis that he has had in the past because the quality  of the pain rather dull compared to his sickle cell crisis which has sharp  pains.  He has not had one of these in recent  weeks.   PAST MEDICAL HISTORY:  1. Sickle cell anemia.  2. Coronary artery disease/congestive heart failure.  3. Pulmonary embolus bilaterally with infarct in November, 2002.  4. Left lower extremity community acquired pneumonia with pleural effusion.  5. Chronic pulmonary obstructive disease.  6. The patient had a Cardiolite in 2002 with an ejection fraction of 59%     with an indication of minimal septal hypokinesis at that time.  No     further work up was initiated.   PAST SURGICAL HISTORY:  None.   FAMILY HISTORY:  Father is known to have sickle cell trait and has had a  stroke and died at age 54 from its complications.  Mother has a history of  diabetes, sickle anemia, CAD/hypertension, as does a maternal aunt.  The  patient has no siblings.   SOCIAL HISTORY:  Cigarettes one pack per day x27 years.  Denies any alcohol  or street drugs.   REVIEW OF SYSTEMS:  Fevers, chills, nocturnal diaphoresis.  Upper  respiratory symptomatology with a nonproductive dry cough.  Shortness of  breath and dyspnea on exertion.  Also night sweats with a 25 pound weight  loss over the last 12 months.  Also  describes urinary frequency, nocturnal  urination which appears consistent with prostatism.   PHYSICAL EXAMINATION:  VITAL SIGNS:  On admission temperature 98.1, pulse  111, respirations 24, blood pressure 144/85.  Pulse oximetry 88% in room air  and 99% on two liters.  GENERAL:  In no acute distress, mild shortness of breath.  A frail and  cachectic African-American male with icteric sclerae.  HEENT:  Extraocular movements are intact.  Pupils are equal, round and  reactive to light.  NECK:  Supple with no JVD, no VRT, there is no cervical  lymphadenopathy.  CHEST:  Decreased breath sounds bilaterally with bibasilar fine crackles.  HEART:  Is tachycardic with regular rate and rhythm S1, S2.  No S3, no S4,  1/6 left lower sternal border/systolic ejection murmur  ABDOMEN: Soft with decreased positive bowel sounds, non distended, no  hepatosplenomegaly.  EXTREMITIES:  No clubbing, cyanosis or edema.  SKIN:  Is intact.  NEUROLOGIC:  Alert and oriented x3, Cranial nerves II-XII grossly intact.   LABORATORY TESTING AND DIAGNOSTICS:  Sodium 134, potassium 4.9, chloride  105, CO2 24, BUN 12, creatinine 1.3, glucose 97.  WBC 13.3, hemoglobin 12,  hematocrit 34, platelets 237,000.  BNP less than 30.  CK 34, troponin I less  than 0.01.  ABG: 7.462/36/26.  Chest CT positive for bilateral pulmonary  embolus.  EKG has left ventricular hypertrophy, LAD, with a P mitrale.   ASSESSMENT AND PLAN:  1. Cardiopulmonary: Bilateral pulmonary embolus with inhalation chest pain.     The patient will be placed on telemetry, have serial cardiac enzymes     performed, will be placed on IV heparin protocol and started on Coumadin     in 24 to 48 hours.  He will also have lower extremity duplex studies for     deep venous thrombosis evaluation.  2. Differential diagnosis is for sickle cell trait complication as well as     hypercoagulable state and for this reason will have nasal oxygen in place    as well as  MSIR and Morphine for pain.  He will also undergo     hypercoagulability work up with anticardiolipin, lupus anticoagulant     antibodies as well as antiphospholipid, protein CNS, antithrombin III as     well  as PT, PTT and d-Dimer for pulmonary embolus and deep venous     thrombosis.  3. Infectious disease:  Presumptive bronchitis with chronic pulmonary     obstructive disease and history of CAP.  Although the patient has no     temperature he did have a dry, nonproductive cough which may be related     to chronic pulmonary obstructive disease or complications of pulmonary     emboli.  He will be placed empirically on Avelox 400 IV daily, Duo-Neb's     and nasal cannula.  Will have PA and lateral chest x-ray performed as     well as a urinalysis and blood cultures preemptively.  4. Urology:  Presumptive prostatism.  Check PSA, prostatic examination and     DRA deferred by patient in the emergency department.  5. Oncology:  Nocturnal sweats and weight loss suspicious for potential     cancer in a patient with a tobacco habit of 24 years and repeated     pulmonary emboli.  This may be at the root of the upper coagulable state.     CEA, CA-125, ESR, PSA will be also done. Would consider further work up     possibly with chest MRI, abdominal CT.  Will also plan to have PPD for     evaluation of tubercular exposure.  6. The patient has a presumptive history of coronary artery disease with     septal hypokinesis shown in the past.  Will also order a 2D     echocardiogram for repeat evaluation comparison to echo of 2002.  Lasix     will be used as needed should chest x-ray show reaccumulation of fluid or     pleural effusion.  7. Hematology:  Sickle cell anemia.  No apparent sickle crisis at this time.     Morphine, MSIR intravenously used for pain.  Nasal oxygen will be     maintained to have pulse oximetry greater than 95%.  Drs. Dicastro and     Nathaniel Man should be contacted at the [facility] clinic  for further follow up.     The patient will also receive D5 half normal saline with  20 mEq of Kay Ciel x3 liters at 100 cc/Hr., and be kept on clear liquids  just over night.  1. Prophylaxis:  Anticoagulation as above.  Ambien 5 mg p.o. q.h.s. p.r.n.,     Zofran p.r.n., and Senokot and Tylenol p.r.n.  2. Code status: Is full code.                                                   Norwood Levo, MD    APM/MEDQ  D:  08/26/2003  T:  08/26/2003  Job:  [id number]   cc:   Eino Farber., M.D.  [address] [place]  [place] [address]  Fax: [phone]   Dr. Charm[facility]   [facility]

## 2011-07-26 LAB — COMPREHENSIVE METABOLIC PANEL
ALT: 9
Alkaline Phosphatase: 50
BUN: 13
CO2: 26
CO2: 28
Chloride: 101
Chloride: 102
Creatinine, Ser: 1.42
GFR calc non Af Amer: 41 — ABNORMAL LOW
GFR calc non Af Amer: 49 — ABNORMAL LOW
Glucose, Bld: 119 — ABNORMAL HIGH
Potassium: 5.4 — ABNORMAL HIGH
Sodium: 139
Total Bilirubin: 0.7
Total Bilirubin: 0.9

## 2011-07-26 LAB — CBC
HCT: 23.4 — ABNORMAL LOW
HCT: 30.1 — ABNORMAL LOW
Hemoglobin: 9.3 — ABNORMAL LOW
MCHC: 30.3
MCHC: 30.9
MCHC: 32
MCV: 59.9 — ABNORMAL LOW
Platelets: 286
Platelets: 314
RBC: 4.6
RBC: 5.03
RDW: 25.5 — ABNORMAL HIGH
RDW: 25.7 — ABNORMAL HIGH
WBC: 12.3 — ABNORMAL HIGH
WBC: 7.3

## 2011-07-26 LAB — DIFFERENTIAL
Basophils Absolute: 0
Lymphocytes Relative: 40
Monocytes Relative: 7

## 2011-07-26 LAB — RETICULOCYTES
RBC.: 4.32
Retic Count, Absolute: 43.2

## 2011-07-26 LAB — CROSSMATCH: ABO/RH(D): A POS

## 2011-07-26 LAB — ABO/RH: ABO/RH(D): A POS

## 2011-07-26 LAB — PROTIME-INR
INR: 1.9 — ABNORMAL HIGH
INR: 5.2
INR: 6.2
Prothrombin Time: 41.4 — ABNORMAL HIGH
Prothrombin Time: 50.3 — ABNORMAL HIGH

## 2011-07-26 LAB — OCCULT BLOOD X 1 CARD TO LAB, STOOL: Fecal Occult Bld: POSITIVE

## 2011-07-27 LAB — CBC
HCT: 28.1 — ABNORMAL LOW
HCT: 28.7 — ABNORMAL LOW
Hemoglobin: 8.7 — ABNORMAL LOW
MCHC: 31
MCHC: 31.2
MCHC: 31.3
MCV: 64.1 — ABNORMAL LOW
MCV: 64.9 — ABNORMAL LOW
Platelets: 288
Platelets: 307
RBC: 4.34
RBC: 4.41
RDW: 33.7 — ABNORMAL HIGH
WBC: 10.2
WBC: 8.7
WBC: 9.7

## 2011-07-27 LAB — BLOOD GAS, ARTERIAL
Bicarbonate: 30.3 — ABNORMAL HIGH
O2 Saturation: 89.9
Patient temperature: 98.6
TCO2: 31.7
pH, Arterial: 7.427

## 2011-07-27 LAB — BASIC METABOLIC PANEL
BUN: 18
BUN: 19
Calcium: 8.8
Chloride: 99
Creatinine, Ser: 1.36
Creatinine, Ser: 1.42
GFR calc Af Amer: 60 — ABNORMAL LOW
Glucose, Bld: 82
Potassium: 3.8

## 2011-07-27 LAB — PROTIME-INR
INR: 1.2
Prothrombin Time: 14.6
Prothrombin Time: 15.5 — ABNORMAL HIGH
Prothrombin Time: 16.2 — ABNORMAL HIGH

## 2011-07-30 LAB — PROTIME-INR
INR: 1.2
INR: 1.4
INR: 1.7 — ABNORMAL HIGH
INR: 2.7 — ABNORMAL HIGH
INR: 3.3 — ABNORMAL HIGH
INR: 4.1 — ABNORMAL HIGH
Prothrombin Time: 15.2
Prothrombin Time: 17.7 — ABNORMAL HIGH
Prothrombin Time: 20.5 — ABNORMAL HIGH
Prothrombin Time: 30.7 — ABNORMAL HIGH

## 2011-07-30 LAB — POCT I-STAT, CHEM 8
BUN: 15
Calcium, Ion: 1.21
Creatinine, Ser: 1.6 — ABNORMAL HIGH
Hemoglobin: 9.9 — ABNORMAL LOW
TCO2: 27

## 2011-07-30 LAB — CBC
HCT: 25.4 — ABNORMAL LOW
HCT: 30.9 — ABNORMAL LOW
Hemoglobin: 7.8 — CL
Hemoglobin: 8.9 — ABNORMAL LOW
Hemoglobin: 9.4 — ABNORMAL LOW
Hemoglobin: 9.6 — ABNORMAL LOW
MCHC: 30.5
MCHC: 30.5
MCV: 58.5 — ABNORMAL LOW
MCV: 64.6 — ABNORMAL LOW
Platelets: 313
RBC: 4.34
RBC: 4.53
RBC: 4.79
RBC: 4.88
RDW: 26.7 — ABNORMAL HIGH
WBC: 6
WBC: 8.7

## 2011-07-30 LAB — SAMPLE TO BLOOD BANK

## 2011-07-30 LAB — DIFFERENTIAL
Basophils Absolute: 0.1
Basophils Relative: 1
Lymphocytes Relative: 40
Monocytes Absolute: 0.2
Neutro Abs: 2.6

## 2011-07-30 LAB — BASIC METABOLIC PANEL
BUN: 24 — ABNORMAL HIGH
CO2: 25
CO2: 32
CO2: 32
Calcium: 9.1
Calcium: 9.3
Calcium: 8.9
Chloride: 103
Chloride: 96
Chloride: 99
Creatinine, Ser: 1.61 — ABNORMAL HIGH
Creatinine, Ser: 1.2
GFR calc Af Amer: 48 — ABNORMAL LOW
GFR calc Af Amer: 52 — ABNORMAL LOW
GFR calc Af Amer: >60
GFR calc Af Amer: >60
GFR calc Af Amer: >60
GFR calc non Af Amer: 40 — ABNORMAL LOW
GFR calc non Af Amer: 43 — ABNORMAL LOW
Potassium: 4.7
Potassium: 4.9
Potassium: 5.2 — ABNORMAL HIGH
Sodium: 136
Sodium: 137
Sodium: 137

## 2011-07-30 LAB — RETICULOCYTES
RBC.: 4.32
Retic Count, Absolute: 43.2
Retic Ct Pct: 1

## 2011-07-30 LAB — CROSSMATCH: Antibody Screen: NEGATIVE

## 2011-08-04 ENCOUNTER — Other Ambulatory Visit: Payer: Self-pay | Admitting: Ophthalmology

## 2011-08-04 LAB — CBC
HCT: 24.5 — ABNORMAL LOW
Hemoglobin: 8 — ABNORMAL LOW
Hemoglobin: 8 — ABNORMAL LOW
MCHC: 33
MCHC: 33.1
MCV: 74.8 — ABNORMAL LOW
Platelets: 346
RBC: 2.96 — ABNORMAL LOW
RBC: 3.15 — ABNORMAL LOW
RDW: 30.4 — ABNORMAL HIGH
RDW: 31.3 — ABNORMAL HIGH
WBC: 6.7
WBC: 8.1

## 2011-08-04 LAB — BASIC METABOLIC PANEL
Calcium: 8.4
Creatinine, Ser: 1.45
GFR calc Af Amer: 58 — ABNORMAL LOW
GFR calc non Af Amer: 48 — ABNORMAL LOW
Sodium: 140

## 2011-08-04 LAB — CARDIAC PANEL(CRET KIN+CKTOT+MB+TROPI)
CK, MB: 3.8
Relative Index: INVALID
Total CK: 43
Total CK: 51
Troponin I: 0.01

## 2011-08-04 LAB — PROTIME-INR
INR: 1.3
INR: 1.8 — ABNORMAL HIGH
INR: 5.2
Prothrombin Time: 21.5 — ABNORMAL HIGH
Prothrombin Time: 52.6 — ABNORMAL HIGH

## 2011-08-19 LAB — DIFFERENTIAL
Basophils Relative: 0
Eosinophils Relative: 2
Lymphocytes Relative: 29
Monocytes Relative: 11
Neutrophils Relative %: 58

## 2011-08-19 LAB — I-STAT 8, (EC8 V) (CONVERTED LAB)
Chloride: 108
Glucose, Bld: 96
Potassium: 4.9
pCO2, Ven: 43 — ABNORMAL LOW
pH, Ven: 7.437 — ABNORMAL HIGH

## 2011-08-19 LAB — CBC
HCT: 31.5 — ABNORMAL LOW
MCV: 60.2 — ABNORMAL LOW
Platelets: 345
RBC: 5.24
WBC: 5.2

## 2011-08-19 LAB — PROTIME-INR
INR: 1.6 — ABNORMAL HIGH
Prothrombin Time: 19.4 — ABNORMAL HIGH

## 2011-08-19 LAB — POCT I-STAT CREATININE
Creatinine, Ser: 1.6 — ABNORMAL HIGH
Operator id: 265961

## 2012-12-28 ENCOUNTER — Inpatient Hospital Stay (HOSPITAL_COMMUNITY)
Admission: EM | Admit: 2012-12-28 | Discharge: 2012-12-30 | DRG: 191 | Disposition: A | Discharge status: Home / Self Care | Payer: Medicare Other | Attending: Internal Medicine | Admitting: Internal Medicine

## 2012-12-28 ENCOUNTER — Emergency Department (HOSPITAL_COMMUNITY): Payer: Medicare Other

## 2012-12-28 ENCOUNTER — Encounter (HOSPITAL_COMMUNITY): Payer: Self-pay | Admitting: Emergency Medicine

## 2012-12-28 DIAGNOSIS — F17200 Nicotine dependence, unspecified, uncomplicated: Secondary | ICD-10-CM | POA: Diagnosis present

## 2012-12-28 DIAGNOSIS — J309 Allergic rhinitis, unspecified: Secondary | ICD-10-CM

## 2012-12-28 DIAGNOSIS — Z7901 Long term (current) use of anticoagulants: Secondary | ICD-10-CM

## 2012-12-28 DIAGNOSIS — J438 Other emphysema: Secondary | ICD-10-CM

## 2012-12-28 DIAGNOSIS — Z9981 Dependence on supplemental oxygen: Secondary | ICD-10-CM

## 2012-12-28 DIAGNOSIS — J4489 Other specified chronic obstructive pulmonary disease: Secondary | ICD-10-CM

## 2012-12-28 DIAGNOSIS — E440 Moderate protein-calorie malnutrition: Secondary | ICD-10-CM | POA: Diagnosis present

## 2012-12-28 DIAGNOSIS — J441 Chronic obstructive pulmonary disease with (acute) exacerbation: Principal | ICD-10-CM | POA: Diagnosis present

## 2012-12-28 DIAGNOSIS — Z681 Body mass index (BMI) 19 or less, adult: Secondary | ICD-10-CM

## 2012-12-28 DIAGNOSIS — E871 Hypo-osmolality and hyponatremia: Secondary | ICD-10-CM | POA: Diagnosis present

## 2012-12-28 DIAGNOSIS — I2789 Other specified pulmonary heart diseases: Secondary | ICD-10-CM

## 2012-12-28 DIAGNOSIS — I2782 Chronic pulmonary embolism: Secondary | ICD-10-CM | POA: Diagnosis present

## 2012-12-28 DIAGNOSIS — Z791 Long term (current) use of non-steroidal anti-inflammatories (NSAID): Secondary | ICD-10-CM

## 2012-12-28 DIAGNOSIS — D571 Sickle-cell disease without crisis: Secondary | ICD-10-CM

## 2012-12-28 DIAGNOSIS — Z86718 Personal history of other venous thrombosis and embolism: Secondary | ICD-10-CM

## 2012-12-28 DIAGNOSIS — D649 Anemia, unspecified: Secondary | ICD-10-CM

## 2012-12-28 DIAGNOSIS — Z79899 Other long term (current) drug therapy: Secondary | ICD-10-CM

## 2012-12-28 DIAGNOSIS — R937 Abnormal findings on diagnostic imaging of other parts of musculoskeletal system: Secondary | ICD-10-CM | POA: Diagnosis present

## 2012-12-28 HISTORY — DX: Sickle-cell disease without crisis: D57.1

## 2012-12-28 HISTORY — DX: Chronic obstructive pulmonary disease, unspecified: J44.9

## 2012-12-28 LAB — CBC
HCT: 23.4 % — ABNORMAL LOW (ref 39.0–52.0)
Hemoglobin: 7.1 g/dL — ABNORMAL LOW (ref 13.0–17.0)
MCH: 15.7 pg — ABNORMAL LOW (ref 26.0–34.0)
MCHC: 30.3 g/dL (ref 30.0–36.0)
MCV: 51.7 fL — ABNORMAL LOW (ref 78.0–100.0)
Platelets: 233 10*3/uL (ref 150–400)
RBC: 4.53 MIL/uL (ref 4.22–5.81)
RDW: 24.1 % — ABNORMAL HIGH (ref 11.5–15.5)
WBC: 5.3 10*3/uL (ref 4.0–10.5)

## 2012-12-28 LAB — PROTIME-INR
INR: 1.19 (ref 0.00–1.49)
Prothrombin Time: 14.9 seconds (ref 11.6–15.2)

## 2012-12-28 LAB — BASIC METABOLIC PANEL
BUN: 8 mg/dL (ref 6–23)
CO2: 29 mEq/L (ref 19–32)
Calcium: 9 mg/dL (ref 8.4–10.5)
Chloride: 89 mEq/L — ABNORMAL LOW (ref 96–112)
Creatinine, Ser: 1.25 mg/dL (ref 0.50–1.35)
GFR calc Af Amer: 64 mL/min — ABNORMAL LOW (ref >90)
GFR calc non Af Amer: 55 mL/min — ABNORMAL LOW (ref >90)
Glucose, Bld: 97 mg/dL (ref 70–99)
Potassium: 3.6 mEq/L (ref 3.5–5.1)
Sodium: 127 mEq/L — ABNORMAL LOW (ref 135–145)

## 2012-12-28 LAB — TROPONIN I: Troponin I: <0.3 ng/mL (ref <0.30)

## 2012-12-28 MED ORDER — SODIUM CHLORIDE 0.9 % IV BOLUS (SEPSIS)
1000.0000 mL | Freq: Once | INTRAVENOUS | Status: AC
  Administered 2012-12-28: 1000 mL via INTRAVENOUS

## 2012-12-28 MED ORDER — MORPHINE SULFATE 4 MG/ML IJ SOLN
6.0000 mg | Freq: Once | INTRAMUSCULAR | Status: AC
  Administered 2012-12-28: 6 mg via INTRAVENOUS
  Filled 2012-12-28: qty 2

## 2012-12-28 MED ORDER — SODIUM CHLORIDE 0.9 % IV BOLUS (SEPSIS): 500.0000 mL | Freq: Once | INTRAVENOUS | Status: DC

## 2012-12-28 MED ORDER — ONDANSETRON HCL 4 MG/2ML IJ SOLN
4.0000 mg | Freq: Once | INTRAMUSCULAR | Status: AC
  Administered 2012-12-28: 4 mg via INTRAVENOUS
  Filled 2012-12-28: qty 2

## 2012-12-28 NOTE — ED Notes (Signed)
Spoke with EDP about need for occult blood.  EDP aware card by bedside

## 2012-12-28 NOTE — ED Notes (Signed)
Per EMS: Pt c/o of SOB is on humidified room air. Hx of COPD. No pain. NSR and regular. 20 g in left forearm.

## 2012-12-28 NOTE — ED Notes (Signed)
Spoke with lab, they stated they will call the floor when the blood is ready

## 2012-12-28 NOTE — ED Provider Notes (Signed)
History    77 year old male with shortness of breath. Patient has a history of COPD and baseline shortness of breath, but he says the symptoms recently or acute change. Chronic cough without acute change. Nonproductive. No fevers or chills. Denies any chest pain. He has a past history of history significant for hemoglobin Hymera sickle cell disease; pulmonary embolism on chronic anticoagulation; chronic obstructive pulmonary disease; tobacco abuse; and hypertension. This is based on previous notes which are not recent. Pt unable to tell me if he is still currently taking coumadin.   CSN: [id number]  Arrival date & time 12/28/12  1657   First MD Initiated Contact with Patient 12/28/12 1708      Chief Complaint  Patient presents with  . Shortness of Breath    (Consider location/radiation/quality/duration/timing/severity/associated sxs/prior treatment) HPI  Past Medical History  Diagnosis Date  . COPD (chronic obstructive pulmonary disease)   . Sickle cell anemia     History reviewed. No pertinent past surgical history.  No family history on file.  History  Substance Use Topics  . Smoking status: Light Tobacco Smoker  . Smokeless tobacco: Not on file  . Alcohol Use: No      Review of Systems  All systems reviewed and negative, other than as noted in HPI.   Allergies  Review of patient's allergies indicates no known allergies.  Home Medications   Current Outpatient Rx  Name  Route  Sig  Dispense  Refill  . albuterol (PROVENTIL) (2.5 MG/3ML) 0.083% nebulizer solution   Nebulization   Take 2.5 mg by nebulization every 6 (six) hours as needed (wheezing).         Marland Kitchen albuterol-ipratropium (COMBIVENT) 18-103 MCG/ACT inhaler   Inhalation   Inhale 2 puffs into the lungs 4 (four) times daily.         . beclomethasone (QVAR) 80 MCG/ACT inhaler   Inhalation   Inhale 2 puffs into the lungs 2 (two) times daily.         . fluticasone (FLONASE) 50 MCG/ACT nasal spray  Nasal   Place 2 sprays into the nose daily.         Marland Kitchen HYDROcodone-acetaminophen (VICODIN) 5-500 MG per tablet   Oral   Take 1 tablet by mouth every 6 (six) hours as needed (pain).         . pantoprazole (PROTONIX) 40 MG tablet   Oral   Take 40 mg by mouth every evening.         . tiotropium (SPIRIVA) 18 MCG inhalation capsule   Inhalation   Place 18 mcg into inhaler and inhale daily.         . Vitamin D, Ergocalciferol, (DRISDOL) 50000 UNITS CAPS   Oral   Take 50,000 Units by mouth every 7 (seven) days. On mondays         . warfarin (COUMADIN) 5 MG tablet   Oral   Take 7.5 mg by mouth every evening.           BP 152/61  Pulse 94  Temp(Src) 97.8 F (36.6 C) (Oral)  Resp 19  SpO2 100%  Physical Exam  Nursing note and vitals reviewed. Constitutional: He appears well-developed and well-nourished. No distress.  HENT:  Head: Normocephalic and atraumatic.  Eyes: Conjunctivae are normal. Right eye exhibits no discharge. Left eye exhibits no discharge.  Neck: Neck supple.  Cardiovascular: Regular rhythm and normal heart sounds.  Exam reveals no gallop and no friction rub.   No murmur heard. Mild tachycardia  with regular rhythm  Pulmonary/Chest: Effort normal and breath sounds normal. No respiratory distress.  Abdominal: Soft. He exhibits no distension. There is no tenderness.  Musculoskeletal: He exhibits no edema and no tenderness.  Lower extremities symmetric as compared to each other. No calf tenderness. Negative Homan's. No palpable cords.   Neurological: He is alert.  Skin: Skin is warm and dry.  Psychiatric: He has a normal mood and affect. His behavior is normal. Thought content normal.    ED Course  Procedures (including critical care time)  CRITICAL CARE Performed by: Raeford Razor   Total critical care time: 30 minutes  Critical care time was exclusive of separately billable procedures and treating other patients.  Critical care was  necessary to treat or prevent imminent or life-threatening deterioration.  Critical care was time spent personally by me on the following activities: development of treatment plan with patient and/or surrogate as well as nursing, discussions with consultants, evaluation of patient's response to treatment, examination of patient, obtaining history from patient or surrogate, ordering and performing treatments and interventions, ordering and review of laboratory studies, ordering and review of radiographic studies, pulse oximetry and re-evaluation of patient's condition.   Labs Reviewed  CBC - Abnormal; Notable for the following:    Hemoglobin 7.1 (*)    HCT 23.4 (*)    MCV 51.7 (*)    MCH 15.7 (*)    RDW 24.1 (*)    All other components within normal limits  BASIC METABOLIC PANEL - Abnormal; Notable for the following:    Sodium 127 (*)    Chloride 89 (*)    GFR calc non Af Amer 55 (*)    GFR calc Af Amer 64 (*)    All other components within normal limits  TROPONIN I   Dg Chest 2 View  12/28/2012  *RADIOLOGY REPORT*  Clinical Data: Short of breath, COPD  CHEST - 2 VIEW  Comparison: Chest radiograph 01/13/2011  Findings: Normal mediastinum and heart silhouette.  Lungs are hyperinflated.  There is there is scarring at the lung bases.  No effusion, infiltrate, pneumothorax.  There is some endosteal scalloping within the right humerus proximally.  IMPRESSION:  1.  Severe emphysematous change.  No acute cardiopulmonary findings. 2.  Subtle endosteal scalloping in the right humerus.  This may be benign finding but recommend biochemical correlation with myeloma.   Original Report Authenticated By: Genevive Bi, M.D.    EKG:  Rhythm: normal sinus. Poor baseline.  Vent. rate 88 BPM PR interval 142 ms QRS duration 142 ms QT/QTc 396/479 ms ST segments: NS ST changes    1. Allergic rhinitis, cause unspecified   2. Chronic airway obstruction, not elsewhere classified   3. Personal history of  venous thrombosis and embolism   4. Sickle-cell disease, unspecified   5. Anemia   6. Other chronic pulmonary heart diseases   7. PULMONARY EMBOLISM, HX OF   8. SICKLE CELL ANEMIA   9. PULMONARY HYPERTENSION   10. COPD   11. EMPHYSEMA, SEVERE   12. ALLERGIC RHINITIS       MDM  77 year old male with generalized weakness and increasing dyspnea. He limits 7.1. Patient is anemic at baseline, but this is a drop. History of pulmonary embolism previously on Coumadin. Patient doesn't seem to think that he is still taking it. He may not be with a normal INR today. No symptoms of GI bleed. Given symptoms though and anemia which appears to be change, will transfuse and admit.  Raeford Razor, MD 12/31/12 [id number]

## 2012-12-28 NOTE — ED Notes (Signed)
ZOX:[id number]<AV> Expected date:<BR> Expected time:<BR> Means of arrival:<BR> Comments:<BR> Ems/ sob

## 2012-12-29 ENCOUNTER — Inpatient Hospital Stay (HOSPITAL_COMMUNITY): Payer: Medicare Other

## 2012-12-29 ENCOUNTER — Encounter (HOSPITAL_COMMUNITY): Payer: Self-pay | Admitting: General Practice

## 2012-12-29 DIAGNOSIS — J449 Chronic obstructive pulmonary disease, unspecified: Secondary | ICD-10-CM

## 2012-12-29 DIAGNOSIS — D649 Anemia, unspecified: Secondary | ICD-10-CM | POA: Diagnosis present

## 2012-12-29 DIAGNOSIS — J309 Allergic rhinitis, unspecified: Secondary | ICD-10-CM

## 2012-12-29 LAB — IRON AND TIBC
Iron: 176 ug/dL — ABNORMAL HIGH (ref 42–135)
UIBC: 211 ug/dL (ref 125–400)

## 2012-12-29 LAB — HEMOGLOBIN AND HEMATOCRIT, BLOOD
HCT: 29.4 % — ABNORMAL LOW (ref 39.0–52.0)
Hemoglobin: 9.2 g/dL — ABNORMAL LOW (ref 13.0–17.0)

## 2012-12-29 LAB — EXPECTORATED SPUTUM ASSESSMENT W GRAM STAIN, RFLX TO RESP C

## 2012-12-29 LAB — PREPARE RBC (CROSSMATCH)

## 2012-12-29 LAB — OCCULT BLOOD, POC DEVICE: Fecal Occult Bld: NEGATIVE

## 2012-12-29 LAB — FOLATE: Folate: >20 ng/mL

## 2012-12-29 LAB — RETICULOCYTES: Retic Count, Absolute: 26.1 10*3/uL (ref 19.0–186.0)

## 2012-12-29 MED ORDER — METHYLPREDNISOLONE SODIUM SUCC 125 MG IJ SOLR
60.0000 mg | Freq: Four times a day (QID) | INTRAMUSCULAR | Status: DC
  Administered 2012-12-29 (×2): 60 mg via INTRAVENOUS
  Filled 2012-12-29 (×6): qty 0.96

## 2012-12-29 MED ORDER — WARFARIN - PHARMACIST DOSING INPATIENT: Freq: Every day | Status: DC

## 2012-12-29 MED ORDER — ONDANSETRON HCL 4 MG PO TABS: 4.0000 mg | ORAL_TABLET | Freq: Four times a day (QID) | ORAL | Status: DC | PRN

## 2012-12-29 MED ORDER — ONDANSETRON HCL 4 MG/2ML IJ SOLN: 4.0000 mg | Freq: Four times a day (QID) | INTRAMUSCULAR | Status: DC | PRN

## 2012-12-29 MED ORDER — HYDROCODONE-ACETAMINOPHEN 5-325 MG PO TABS
1.0000 | ORAL_TABLET | ORAL | Status: DC | PRN
  Administered 2012-12-30: 1 via ORAL
  Filled 2012-12-29: qty 1

## 2012-12-29 MED ORDER — LEVOFLOXACIN 250 MG PO TABS
250.0000 mg | ORAL_TABLET | Freq: Every day | ORAL | Status: DC
  Administered 2012-12-30: 250 mg via ORAL
  Filled 2012-12-29 (×2): qty 1

## 2012-12-29 MED ORDER — LEVOFLOXACIN 500 MG PO TABS: 500.0000 mg | ORAL_TABLET | Freq: Every day | ORAL | Status: DC

## 2012-12-29 MED ORDER — PANTOPRAZOLE SODIUM 40 MG PO TBEC
40.0000 mg | DELAYED_RELEASE_TABLET | Freq: Every evening | ORAL | Status: DC
  Administered 2012-12-29: 40 mg via ORAL
  Filled 2012-12-29 (×3): qty 1

## 2012-12-29 MED ORDER — ENSURE COMPLETE PO LIQD
237.0000 mL | Freq: Three times a day (TID) | ORAL | Status: DC
  Administered 2012-12-29 – 2012-12-30 (×4): 237 mL via ORAL

## 2012-12-29 MED ORDER — ACETAMINOPHEN 325 MG PO TABS: 650.0000 mg | ORAL_TABLET | Freq: Four times a day (QID) | ORAL | Status: DC | PRN

## 2012-12-29 MED ORDER — SODIUM CHLORIDE 0.9 % IV SOLN
INTRAVENOUS | Status: DC
  Administered 2012-12-29: 02:00:00 via INTRAVENOUS

## 2012-12-29 MED ORDER — ALBUTEROL SULFATE (5 MG/ML) 0.5% IN NEBU
2.5000 mg | INHALATION_SOLUTION | RESPIRATORY_TRACT | Status: DC | PRN
  Administered 2012-12-29: 2.5 mg via RESPIRATORY_TRACT
  Filled 2012-12-29 (×2): qty 0.5

## 2012-12-29 MED ORDER — PREDNISONE 50 MG PO TABS
50.0000 mg | ORAL_TABLET | Freq: Every day | ORAL | Status: DC
  Administered 2012-12-30: 50 mg via ORAL
  Filled 2012-12-29 (×2): qty 1

## 2012-12-29 MED ORDER — ALBUTEROL SULFATE (5 MG/ML) 0.5% IN NEBU
2.5000 mg | INHALATION_SOLUTION | Freq: Four times a day (QID) | RESPIRATORY_TRACT | Status: DC
  Administered 2012-12-29 – 2012-12-30 (×7): 2.5 mg via RESPIRATORY_TRACT
  Filled 2012-12-29 (×8): qty 0.5

## 2012-12-29 MED ORDER — LEVOFLOXACIN IN D5W 250 MG/50ML IV SOLN
250.0000 mg | Freq: Every morning | INTRAVENOUS | Status: DC
  Filled 2012-12-29: qty 50

## 2012-12-29 MED ORDER — ACETAMINOPHEN 650 MG RE SUPP: 650.0000 mg | Freq: Four times a day (QID) | RECTAL | Status: DC | PRN

## 2012-12-29 MED ORDER — ZOLPIDEM TARTRATE 5 MG PO TABS: 5.0000 mg | ORAL_TABLET | Freq: Every evening | ORAL | Status: DC | PRN

## 2012-12-29 MED ORDER — BIOTENE DRY MOUTH MT LIQD
15.0000 mL | Freq: Two times a day (BID) | OROMUCOSAL | Status: DC
  Administered 2012-12-29 – 2012-12-30 (×3): 15 mL via OROMUCOSAL

## 2012-12-29 MED ORDER — MORPHINE SULFATE 2 MG/ML IJ SOLN
1.0000 mg | INTRAMUSCULAR | Status: DC | PRN
  Administered 2012-12-29 (×3): 1 mg via INTRAVENOUS
  Filled 2012-12-29 (×3): qty 1

## 2012-12-29 MED ORDER — TIOTROPIUM BROMIDE MONOHYDRATE 18 MCG IN CAPS
18.0000 ug | ORAL_CAPSULE | Freq: Every day | RESPIRATORY_TRACT | Status: DC
  Administered 2012-12-29 – 2012-12-30 (×2): 18 ug via RESPIRATORY_TRACT
  Filled 2012-12-29: qty 5

## 2012-12-29 MED ORDER — FLUTICASONE PROPIONATE HFA 44 MCG/ACT IN AERO
1.0000 | INHALATION_SPRAY | Freq: Two times a day (BID) | RESPIRATORY_TRACT | Status: DC
  Administered 2012-12-29 – 2012-12-30 (×3): 1 via RESPIRATORY_TRACT
  Filled 2012-12-29: qty 10.6

## 2012-12-29 MED ORDER — WARFARIN SODIUM 10 MG PO TABS
10.0000 mg | ORAL_TABLET | Freq: Once | ORAL | Status: AC
  Administered 2012-12-29: 10 mg via ORAL
  Filled 2012-12-29: qty 1

## 2012-12-29 MED ORDER — FLUTICASONE PROPIONATE 50 MCG/ACT NA SUSP
2.0000 | Freq: Every day | NASAL | Status: DC
  Administered 2012-12-29 – 2012-12-30 (×2): 2 via NASAL
  Filled 2012-12-29: qty 16

## 2012-12-29 MED ORDER — LEVOFLOXACIN 250 MG PO TABS
250.0000 mg | ORAL_TABLET | Freq: Every day | ORAL | Status: DC
  Administered 2012-12-29: 250 mg via ORAL
  Filled 2012-12-29: qty 1

## 2012-12-29 NOTE — Progress Notes (Signed)
ANTICOAGULATION CONSULT NOTE - Initial Consult  Pharmacy Consult for Warfarin Indication: History of PE  No Known Allergies  Patient Measurements: Height: 5\' 10"  (177.8 cm) Weight: 101 lb 13.6 oz (46.2 kg) IBW/kg (Calculated) : 73  Vital Signs: Temp: 97.8 F (36.6 C) (02/28 0625) Temp src: Oral (02/28 0625) BP: 158/84 mmHg (02/28 0625) Pulse Rate: 93 (02/28 0625)  Labs:  Recent Labs  12/28/12 1740 12/28/12 2155  HGB 7.1*  --   HCT 23.4*  --   PLT 233  --   LABPROT  --  14.9  INR  --  1.19  CREATININE 1.25  --   TROPONINI <0.30  --     Estimated Creatinine Clearance: 33.9 ml/min (by C-G formula based on Cr of 1.25).   Medical History: Past Medical History  Diagnosis Date  . COPD (chronic obstructive pulmonary disease)   . Sickle cell anemia     Medications:  Scheduled:  . albuterol  2.5 mg Nebulization Q6H  . antiseptic oral rinse  15 mL Mouth Rinse BID  . fluticasone  2 spray Each Nare Daily  . fluticasone  1 puff Inhalation BID  . levofloxacin  250 mg Oral Daily  . methylPREDNISolone (SOLU-MEDROL) injection  60 mg Intravenous Q6H  . [COMPLETED]  morphine injection  6 mg Intravenous Once  . [COMPLETED] ondansetron (ZOFRAN) IV  4 mg Intravenous Once  . pantoprazole  40 mg Oral QPM  . [COMPLETED] sodium chloride  1,000 mL Intravenous Once  . tiotropium  18 mcg Inhalation Daily  . [DISCONTINUED] sodium chloride  500 mL Intravenous Once   Infusions:  . [DISCONTINUED] sodium chloride 50 mL/hr at 12/29/12 0157    Assessment:  76 yom with sickle cell trait, admitted with symptomatic anemia, Hgb 7.1 - transfused.  Reportedly on coumadin 7.5mg  daily for hx PE, but INR subtherapeutic (1.19).   Per MD, ok to resume warfarin since stool guaic is negative.  Will resume with slightly higher than home dose due to subtherapeutic INR.  Note potential interaction with levaquin (increase warfarin sensitivity).  Goal of Therapy:  INR 2-3    Plan:   Resume  warfarin 10mg  today  Daily PT/INR  Loralee Pacas, PharmD, BCPS Pager: [phone] 12/29/2012,10:45 AM

## 2012-12-29 NOTE — H&P (Addendum)
[facility]                                                                                    Patient Demographics  Anthony [name], is a 77 y.o. male  CSN: [id number]  MRN: [id number]  DOB - 05-12-1936  Admit Date - 12/28/2012  Outpatient Primary MD for the patient is Dorrene German, MD   With History of -  Past Medical History  Diagnosis Date  . COPD (chronic obstructive pulmonary disease)   . Sickle cell anemia       History reviewed. No pertinent past surgical history.  in for   Chief Complaint  Patient presents with  . Shortness of Breath     HPI  Anthony [name]  is a 77 y.o. male, with past medical history significant for COPD on home oxygen and history of PE in the past on Coumadin presenting today with a few days history of worsening shortness of breath and pleuritic chest pain. The patient has a mild nonproductive cough. He complains of periumbilical pain and headache for the last 2 days. No loss of consciousness but reports mild dizziness. Patient received no treatments in the emergency room but did not help his shortness of breath. Patient reports taking his Coumadin daily although his INR is subtherapeutic. In the emergency room the patient was found to be anemic with a hemoglobin of 7.1, Knowing that the patient has sickle cell trait and is on Coumadin. No history of rectal bleeding or melena.    Review of Systems    In addition to the HPI above,  Mild chills, Retro-orbital Headache, No changes with Vision or hearing, No problems swallowing food or Liquids, No Chest pain, Cough or Shortness of Breath, Periumbilic Abdominal pain, No Nausea or Vommitting, Bowel movements are regular, No Blood in stool or Urine, No dysuria, No new skin rashes or bruises, No new joints pains-aches,  No new weakness, tingling, numbness in any extremity, No recent weight gain or loss, No polyuria, polydypsia or polyphagia, No significant Mental  Stressors.  A full 10 point Review of Systems was done, except as stated above, all other Review of Systems were negative.   Social History History  Substance Use Topics  . Smoking status: Light Tobacco Smoker  . Smokeless tobacco: Not on file  . Alcohol Use: No     Family History Significant for hypertension  Prior to Admission medications   Medication Sig Start Date End Date Taking? Authorizing Provider  albuterol (PROVENTIL) (2.5 MG/3ML) 0.083% nebulizer solution Take 2.5 mg by nebulization every 6 (six) hours as needed (wheezing).   Yes Historical Provider, MD  albuterol-ipratropium (COMBIVENT) 18-103 MCG/ACT inhaler Inhale 2 puffs into the lungs 4 (four) times daily.   Yes Historical Provider, MD  beclomethasone (QVAR) 80 MCG/ACT inhaler Inhale 2 puffs into the lungs 2 (two) times daily.   Yes Historical Provider, MD  fluticasone (FLONASE) 50 MCG/ACT nasal spray Place 2 sprays into the nose daily.   Yes Historical Provider, MD  HYDROcodone-acetaminophen (VICODIN) 5-500 MG per tablet Take 1 tablet by mouth every 6 (six) hours as needed (pain).   Yes Historical Provider, MD  pantoprazole (  PROTONIX) 40 MG tablet Take 40 mg by mouth every evening.   Yes Historical Provider, MD  tiotropium (SPIRIVA) 18 MCG inhalation capsule Place 18 mcg into inhaler and inhale daily.   Yes Historical Provider, MD  Vitamin D, Ergocalciferol, (DRISDOL) 50000 UNITS CAPS Take 50,000 Units by mouth every 7 (seven) days. On mondays   Yes Historical Provider, MD  warfarin (COUMADIN) 5 MG tablet Take 7.5 mg by mouth every evening.   Yes Historical Provider, MD    No Known Allergies  Physical Exam  Vitals  Blood pressure 152/61, pulse 94, temperature 97.8 F (36.6 C), temperature source Oral, resp. rate 19, SpO2 100.00%.   1. General elderly African American male chronically ill in moderate shortness of breath  2. Normal affect and insight, Not Suicidal or Homicidal, Awake Alert, Oriented X 3.  3.  No F.N deficits, ALL C.Nerves Intact, Strength 5/5 all 4 extremities, Sensation intact all 4 extremities, Plantars down going.  4. Ears and Eyes appear Normal, Conjunctivae clear, PERRLA. Moist Oral Mucosa.  5. Supple Neck, No JVD, No cervical lymphadenopathy appriciated, No Carotid Bruits.  6. Symmetrical Chest wall movement, decreased air movement bilaterally with mild scattered rhonchi  7. RRR, No Gallops, Rubs or Murmurs, No Parasternal Heave.  8. Positive Bowel Sounds, Abdomen Soft, mild periumbilical tenderness, No organomegaly appriciated,No rebound -guarding or rigidity.  9.  No Cyanosis, Normal Skin Turgor, No Skin Rash or Bruise.  10. Good muscle tone,  joints appear normal , no effusions, Normal ROM.  11. No Palpable Lymph Nodes in Neck or Axillae    Data Review  CBC  Recent Labs Lab 12/28/12 1740  WBC 5.3  HGB 7.1*  HCT 23.4*  PLT 233  MCV 51.7*  MCH 15.7*  MCHC 30.3  RDW 24.1*   ------------------------------------------------------------------------------------------------------------------  Chemistries   Recent Labs Lab 12/28/12 1740  NA 127*  K 3.6  CL 89*  CO2 29  GLUCOSE 97  BUN 8  CREATININE 1.25  CALCIUM 9.0   ------------------------------------------------------------------------------------------------------------------   Coagulation profile  Recent Labs Lab 12/28/12 2155  INR 1.19   ------------------------------------------------------------------------------------------------------------------- No results found for this basename: DDIMER,  in the last 72 hours -------------------------------------------------------------------------------------------------------------------  Cardiac Enzymes  Recent Labs Lab 12/28/12 1740  TROPONINI <0.30    ------------------------------------------------------------------------------------------------------------------   ---------------------------------------------------------------------------------------------------------------   Dg Chest 2 View  12/28/2012  *RADIOLOGY REPORT*  Clinical Data: Short of breath, COPD  CHEST - 2 VIEW  Comparison: Chest radiograph 01/13/2011  Findings: Normal mediastinum and heart silhouette.  Lungs are hyperinflated.  There is there is scarring at the lung bases.  No effusion, infiltrate, pneumothorax.  There is some endosteal scalloping within the right humerus proximally.  IMPRESSION:  1.  Severe emphysematous change.  No acute cardiopulmonary findings. 2.  Subtle endosteal scalloping in the right humerus.  This may be benign finding but recommend biochemical correlation with myeloma.   Original Report Authenticated By: Genevive Bi, M.D.       Assessment & Plan  1. COPD exacerbation, patient did not improve with the treatments at home 2. Anemia, multifactorial with history of sickle cell trait. Patient also is on Coumadin for PE chronically 3. Chronic renal insufficiency 4. Headache for the last 2 days, concerning since the patient is on Coumadin, CT of head pending 5.Abdominal pain and discomfort, no nausea or vomiting, awaiting KUB please check in am 6. Abnormal humerus on x-ray, concerning for myeloma, patient needs to follow as outpatient 7.hyponatremia   Plan IV Solu-Medrol Nebulizers treatments IV  fluids normal saline at 50 for the hyponatremia  Check CT of the head Check KUB for the abdominal pain  Guaiac stools pending Resume Coumadin if guaiac is negative. Outpatient workup for myeloma because of abnormal humerus on x-ray.  Blood transfusion started in the emergency room.    DVT Prophylaxis SCDs  AM Labs Ordered, also please review Full Orders  Family Communication: Admission, patients condition and plan of care including tests  being ordered have been discussed with the patient who indicate understanding and agree with the plan and Code Status.  Code Status full oh  Disposition Plan: 45 minutes  Time spent in minutes : 35 minutes home  Condition GUARDED

## 2012-12-29 NOTE — Progress Notes (Signed)
Blood consent form signed and in patient's chart.  Answered all questions related to transfusion.  Preparing to give unit of blood now.  Anthony [name]

## 2012-12-29 NOTE — Progress Notes (Signed)
[facility]                                                                                Patient Demographics  Anthony [name], is a 77 y.o. male, DOB - 14-Jun-1936, ZOX:[id number], WJX:[id number]  Admit date - 12/28/2012  Admitting Physician Carron Curie, MD  Outpatient Primary MD for the patient is Dorrene German, MD  LOS - 1   Chief Complaint  Patient presents with  . Shortness of Breath        Assessment & Plan    1. Productive cough and shortness of breath due to COPD exacerbation with mild acute bronchitis, clinically much improved we'll continue empiric antibiotics, nebulizer treatments as needed, he is already on 2 L nasal cannula oxygen which will be continued, will taper steroids down to oral as wheezing is almost gone. Likely discharge in the morning   2. History of multiple PEs. INR subtherapeutic, pharmacy consulted to monitor INR and adjust Coumadin dose, discussed the case with his PCP over the phone, he will followup INR closely as outpatient.   3. History of sickle cell anemia. Patient's baseline hemoglobin per PCP is around 7, stable no acute issues he is Hemoccult-negative, he is on oral supplementation of iron at home which will be continued, anemia panel will be checked here.   4. Nonspecific findings on chest x-ray over the right humerus. We'll request outpatient followup with PCP for multiple myeloma workup and repeat x-ray of the right humerus.      Code Status: Full  Family Communication: Patient and his PCP over the phone  Disposition Plan: Home   Procedures CT of the head unremarkable with chronic changes, KUB   Consults  PCP over the Phone   DVT Prophylaxis  [place] - SCDs    Lab Results  Component Value Date   PLT 233 12/28/2012   Lab Results  Component Value Date   INR 1.19 12/28/2012   INR 1.62* 01/15/2011   INR 1.50* 01/14/2011     Medications  Scheduled Meds: . albuterol  2.5 mg Nebulization Q6H  .  antiseptic oral rinse  15 mL Mouth Rinse BID  . fluticasone  2 spray Each Nare Daily  . fluticasone  1 puff Inhalation BID  . levofloxacin  250 mg Oral Daily  . methylPREDNISolone (SOLU-MEDROL) injection  60 mg Intravenous Q6H  . pantoprazole  40 mg Oral QPM  . tiotropium  18 mcg Inhalation Daily  . warfarin  10 mg Oral ONCE-1800  . Warfarin - Pharmacist Dosing Inpatient   Does not apply q1800   Continuous Infusions:  PRN Meds:.acetaminophen, acetaminophen, albuterol, HYDROcodone-acetaminophen, morphine injection, ondansetron (ZOFRAN) IV, ondansetron, zolpidem  Antibiotics     Anti-infectives   Start     Dose/Rate Route Frequency Ordered Stop   12/29/12 1000  levofloxacin (LEVAQUIN) tablet 250 mg     250 mg Oral Daily 12/29/12 0139         Time Spent in minutes  35   Dionne Rossa K M.D on 12/29/2012 at 10:54 AM  Between 7am to 7pm - Pager - [phone]  After 7pm go to [email or web address] - password [email or web address]  And look for the night coverage  person covering for me after hours  [facility] Office  [phone]    Subjective:   Anthony [name] today has, No headache, No chest pain, No abdominal pain - No Nausea, No new weakness tingling or numbness, much improved  Cough - SOB.    Objective:   Filed Vitals:   12/29/12 0410 12/29/12 0510 12/29/12 0625 12/29/12 0905  BP: 152/76 156/79 158/84   Pulse: 91 93 93   Temp: 97.5 F (36.4 C) 98.2 F (36.8 C) 97.8 F (36.6 C)   TempSrc: Oral Oral Oral   Resp: 16 16 18    Height:      Weight:      SpO2:   100% 94%    Wt Readings from Last 3 Encounters:  12/29/12 46.2 kg (101 lb 13.6 oz)  04/10/09 61.916 kg (136 lb 8 oz)     Intake/Output Summary (Last 24 hours) at 12/29/12 1054 Last data filed at 12/29/12 1021  Gross per 24 hour  Intake   1270 ml  Output    475 ml  Net    795 ml    Exam Awake Alert, Oriented X 3, No new F.N deficits, Normal affect .AT,PERRAL Supple Neck,No JVD, No cervical  lymphadenopathy appriciated.  Symmetrical Chest wall movement, Good air movement bilaterally, CTAB RRR,No Gallops,Rubs or new Murmurs, No Parasternal Heave +ve B.Sounds, Abd Soft, Non tender, No organomegaly appriciated, No rebound - guarding or rigidity. No Cyanosis, Clubbing or edema, No new Rash or bruise      Data Review   Micro Results No results found for this or any previous visit (from the past 240 hour(s)).  Radiology Reports Dg Chest 2 View  12/28/2012  *RADIOLOGY REPORT*  Clinical Data: Short of breath, COPD  CHEST - 2 VIEW  Comparison: Chest radiograph 01/13/2011  Findings: Normal mediastinum and heart silhouette.  Lungs are hyperinflated.  There is there is scarring at the lung bases.  No effusion, infiltrate, pneumothorax.  There is some endosteal scalloping within the right humerus proximally.  IMPRESSION:  1.  Severe emphysematous change.  No acute cardiopulmonary findings. 2.  Subtle endosteal scalloping in the right humerus.  This may be benign finding but recommend biochemical correlation with myeloma.   Original Report Authenticated By: Genevive Bi, M.D.    Abd 1 View (kub)  12/29/2012  *RADIOLOGY REPORT*  Clinical Data: Mid abdominal pain.  ABDOMEN - 1 VIEW  Comparison: CT abdomen and pelvis with contrast 08/28/2003.  Findings: Right greater than left pleural effusions are present. There is mild gaseous distention of bowel without evidence for obstruction or free air.  Degenerative changes are noted in the lumbar spine.  IMPRESSION:  1.  Mild gaseous distention of bowel without evidence for obstruction or free air.   Original Report Authenticated By: Marin Roberts, M.D.    Ct Head Wo Contrast  12/29/2012  *RADIOLOGY REPORT*  Clinical Data: Frontal headache.  Extremity numbness and weakness.  CT HEAD WITHOUT CONTRAST  Technique:  Contiguous axial images were obtained from the base of the skull through the vertex without contrast.  Comparison: None.  Findings: The  brain shows extensive low density affecting the hemispheric white matter consistent with advanced chronic small vessel disease.  There is a focal low density in the right thalamus consistent with an old lacunar infarction.  There is no identifiably acute infarction.  No large vessel territory infarction.  No mass lesion, hemorrhage, hydrocephalus or extra- axial collection.  Certainly, an acute small vessel insult could be  hidden amongst these extensive chronic changes.  No calvarial abnormality.  The patient has had previous ocular surgery on the right.  There has been previous surgery affecting the anterior wall the right maxillary sinus.  The right maxillary sinus is nearly completely opacified and there is mucoperiosteal thickening, suggesting chronic right maxillary sinusitis.  The other visualized sinuses are clear.  IMPRESSION: No identifiably acute intracranial finding.  Extensive low density throughout the hemispheric white matter consistent with chronic small vessel disease.  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  Certainly, an acute or subacute insult could be hidden amongst these extensive chronic changes.  Right maxillary sinus opacification and mucoperiosteal thickening consistent with chronic right maxillary sinusitis.   Original Report Authenticated By: Paulina Fusi, M.D.     CBC  Recent Labs Lab 12/28/12 1740  WBC 5.3  HGB 7.1*  HCT 23.4*  PLT 233  MCV 51.7*  MCH 15.7*  MCHC 30.3  RDW 24.1*    Chemistries   Recent Labs Lab 12/28/12 1740  NA 127*  K 3.6  CL 89*  CO2 29  GLUCOSE 97  BUN 8  CREATININE 1.25  CALCIUM 9.0   ------------------------------------------------------------------------------------------------------------------ estimated creatinine clearance is 33.9 ml/min (by C-G formula based on Cr of 1.25). ------------------------------------------------------------------------------------------------------------------ No  results found for this basename: HGBA1C,  in the last 72 hours ------------------------------------------------------------------------------------------------------------------ No results found for this basename: CHOL, HDL, LDLCALC, TRIG, CHOLHDL, LDLDIRECT,  in the last 72 hours ------------------------------------------------------------------------------------------------------------------ No results found for this basename: TSH, T4TOTAL, FREET3, T3FREE, THYROIDAB,  in the last 72 hours ------------------------------------------------------------------------------------------------------------------ No results found for this basename: VITAMINB12, FOLATE, FERRITIN, TIBC, IRON, RETICCTPCT,  in the last 72 hours  Coagulation profile  Recent Labs Lab 12/28/12 2155  INR 1.19    No results found for this basename: DDIMER,  in the last 72 hours  Cardiac Enzymes  Recent Labs Lab 12/28/12 1740  TROPONINI <0.30   ------------------------------------------------------------------------------------------------------------------ No components found with this basename: POCBNP,

## 2012-12-29 NOTE — Care Management Note (Addendum)
    Page 1 of 1   01/01/2013     8:35:29 AM   CARE MANAGEMENT NOTE 01/01/2013  Patient:  Anthony [name], Anthony [name]   Account Number:  [id number]  Date Initiated:  12/29/2012  Documentation initiated by:  Lorenda Ishihara  Subjective/Objective Assessment:   77 yo male admitted with COPD, anemia. PTA lived at home alone.     Action/Plan:   Home when stable, has used [facility] in the past for [facility] services, not active at this time   Anticipated DC Date:  01/02/2013   Anticipated DC Plan:  HOME W HOME HEALTH SERVICES      DC Planning Services  CM consult      Choice offered to / List presented to:             Status of service:  Completed, signed off Medicare Important Message given?   (If response is "NO", the following Medicare IM given date fields will be blank) Date Medicare IM given:   Date Additional Medicare IM given:    Discharge Disposition:  HOME/SELF CARE  Per UR Regulation:  Reviewed for med. necessity/level of care/duration of stay  If discussed at Long Length of Stay Meetings, dates discussed:    Comments:

## 2012-12-29 NOTE — Progress Notes (Signed)
INITIAL NUTRITION ASSESSMENT  Pt meets criteria for severe MALNUTRITION in the context of chronic illness as evidenced by <50-75% estimated energy intake for the past 3 months in addition to pt with visible severe muscle wasting and subcutaneous fat loss.  DOCUMENTATION CODES Per approved criteria  -Severe malnutrition in the context of chronic illness -Underweight   INTERVENTION: - Ensure Complete TID - Recommend diet liberalization to regular diet - Recommend case manager consult to help with finding outpatient dentist  - Will continue to monitor   NUTRITION DIAGNOSIS: Unintended weight loss related to poor appetite with COPD as evidenced by pt statement.   Goal: Pt to consume >90% of meals/supplements.   Monitor:  Weights, labs, intake  Reason for Assessment: Nutrition risk   77 y.o. male  Admitting Dx: Anemia  ASSESSMENT: Pt reports significant weight loss since diagnosis of COPD 5 years ago. Pt went from 164 pounds to 101 pounds in the past 5 years. Pt reports for the past 2-3 months he has been eating only 1 meal/day because he lives alone and it takes a lot of energy to make 1 meal. This reports this meal is high in protein, typically ribs, chicken, or beans. Pt reports he drinks 1 Ensure/day. Pt reports he has an aid that comes in for 3 hours/day who cleans for him but does not cook for him. Pt reports in the past 3 months his teeth have started falling out. Pt denies any problems swallowing. Performed nutrition focused physical exam.   Nutrition Focused Physical Exam:  Subcutaneous Fat:  Orbital Region: severe wasting  Upper Arm Region: severe wasting Thoracic and Lumbar Region: mild/moderate wasting  Muscle:  Temple Region: severe wasting Clavicle Bone Region: severe wasting Clavicle and Acromion Bone Region: severe wasting Scapular Bone Region: mild/moderate wasting Dorsal Hand: severe wasting Patellar Region: severe wasting Anterior Thigh Region: severe  wasting Posterior Calf Region: severe wasting  Edema: None observed   Height: Ht Readings from Last 1 Encounters:  12/29/12 5\' 10"  (1.778 m)    Weight: Wt Readings from Last 1 Encounters:  12/29/12 101 lb 13.6 oz (46.2 kg)    Ideal Body Weight: 166 lb  % Ideal Body Weight: 61  Wt Readings from Last 10 Encounters:  12/29/12 101 lb 13.6 oz (46.2 kg)  04/10/09 136 lb 8 oz (61.916 kg)    Usual Body Weight: 164 lb 5 years ago per pt report  % Usual Body Weight: 61  BMI:  Body mass index is 14.61 kg/(m^2).  Estimated Nutritional Needs: Kcal: 1600-1850 Protein: 70-95g Fluid: 1.6-1.8L/day  Skin: Intact   Diet Order: Cardiac  EDUCATION NEEDS: -Education needs addressed - briefly educated pt on high calorie/high protein nutrition therapy    Intake/Output Summary (Last 24 hours) at 12/29/12 1206 Last data filed at 12/29/12 1021  Gross per 24 hour  Intake   1270 ml  Output    475 ml  Net    795 ml    Last BM: PTA  Labs:   Recent Labs Lab 12/28/12 1740  NA 127*  K 3.6  CL 89*  CO2 29  BUN 8  CREATININE 1.25  CALCIUM 9.0  GLUCOSE 97    CBG (last 3)  No results found for this basename: GLUCAP,  in the last 72 hours  Scheduled Meds: . albuterol  2.5 mg Nebulization Q6H  . antiseptic oral rinse  15 mL Mouth Rinse BID  . fluticasone  2 spray Each Nare Daily  . fluticasone  1 puff  Inhalation BID  . levofloxacin  250 mg Oral Daily  . pantoprazole  40 mg Oral QPM  . [START ON 12/30/2012] predniSONE  50 mg Oral Q breakfast  . tiotropium  18 mcg Inhalation Daily  . warfarin  10 mg Oral ONCE-1800  . Warfarin - Pharmacist Dosing Inpatient   Does not apply q1800    Continuous Infusions:   Past Medical History  Diagnosis Date  . COPD (chronic obstructive pulmonary disease)   . Sickle cell anemia     History reviewed. No pertinent past surgical history.    Levon Hedger MS, RD, LDN [phone] Pager [phone] After Hours Pager

## 2012-12-30 LAB — PROTIME-INR: Prothrombin Time: 15 seconds (ref 11.6–15.2)

## 2012-12-30 MED ORDER — CETIRIZINE HCL 10 MG PO CHEW: 10.0000 mg | CHEWABLE_TABLET | Freq: Every day | ORAL | Status: DC

## 2012-12-30 MED ORDER — HYDROCODONE-ACETAMINOPHEN 5-500 MG PO TABS: 1.0000 | ORAL_TABLET | Freq: Four times a day (QID) | ORAL | Status: DC | PRN

## 2012-12-30 MED ORDER — FLUTICASONE PROPIONATE 50 MCG/ACT NA SUSP: 2.0000 | Freq: Every day | NASAL | Status: AC

## 2012-12-30 MED ORDER — PREDNISONE 5 MG PO TABS: ORAL_TABLET | ORAL | Status: DC

## 2012-12-30 MED ORDER — BOOST PLUS PO LIQD: 237.0000 mL | Freq: Three times a day (TID) | ORAL | Status: DC

## 2012-12-30 MED ORDER — LEVOFLOXACIN 250 MG PO TABS: 250.0000 mg | ORAL_TABLET | Freq: Every day | ORAL | Status: DC

## 2012-12-30 MED ORDER — WARFARIN SODIUM 5 MG PO TABS: 10.0000 mg | ORAL_TABLET | Freq: Every evening | ORAL | Status: DC

## 2012-12-30 NOTE — Progress Notes (Signed)
Pt discharged via wc to front entrance to meet friend and awaiting vehicle to carry home. Portable O2 applied via nasal cannula at 2L for transport to meet friend with plans to switch over to home O2 tank awaiting in car. Accompanied by NT.

## 2012-12-30 NOTE — Discharge Summary (Addendum)
[facility]                                                                                   Anthony [name], is a 77 y.o. male  DOB Aug 24, 1936  MRN [id number].  Admission date:  12/28/2012  Discharge Date:  12/30/2012  Primary MD  Dorrene German, MD  Admitting Physician  Carron Curie, MD  Admission Diagnosis  Sickle-cell disease, unspecified [282.60] Allergic rhinitis, cause unspecified [477.9] Chronic airway obstruction, not elsewhere classified [496] Personal history of venous thrombosis and embolism [V12.51]  Discharge Diagnosis     Principal Problem:   Anemia Active Problems:   SICKLE CELL ANEMIA   PULMONARY HYPERTENSION   COPD   PULMONARY EMBOLISM, HX OF    Past Medical History  Diagnosis Date  . COPD (chronic obstructive pulmonary disease)   . Sickle cell anemia     History reviewed. No pertinent past surgical history.   Recommendations for primary care physician for things to follow:   Followup INR closely   Discharge Diagnoses:   Principal Problem:   Anemia Active Problems:   SICKLE CELL ANEMIA   PULMONARY HYPERTENSION   COPD   PULMONARY EMBOLISM, HX OF    Discharge Condition: Stable   Diet recommendation: See Discharge Instructions below   Consults      History of present illness and  Hospital Course:     Kindly see H&P for history of present illness and admission details, please review complete Labs, Consult reports and Test reports for all details in brief Anthony [name], is a 77 y.o. male, patient was admitted for shortness of breath wheezing and cough due to COPD exacerbation which is now much better, patient has no wheezing on exam, 100% on 2 L nasal cannula oxygen which he uses at home, will be discharged home on a few more days of Levaquin and prednisone taper, nebulizer treatments as before along with Spiriva scheduled.  Patient also carries history of sickle cell trait for which he is chronic anemia,  discussed his case with his primary care physician over the phone is paced hemoglobin stays around 7.2, on the day of admission he was given a unit of packed RBC transfusion with good results his H&H remained stable he is being Hemoccult negative.  Patient also carries a diagnosis of PE in the past, he is on Coumadin 7.5 mg daily which he says he has been pretty compliant with. I have gone back in his chart since 2011 and then never noticed his INR to be therapeutic, his INR was again subtherapeutic, I have increased his Coumadin to 10 mg daily from 7.5 will request primary care physician to closely monitor his INR.  On his chest x-ray there was a nonspecific finding on right humerus, could be due to a sickle cell trait, there was suspicion of multiple myeloma therefore I obtained a skeletal survey which was unremarkable, patient does not have renal insufficiency his calcium is normal and clinical suspicion for multiple myeloma is low, I will request PCP to kindly obtain a repeat right humerus x-ray in a week. Outpatient workup for multiple myeloma as indicated after that.   Patient  wanted me to convey to his PCP that he would be a good candidate for a mobile scooter device. We'll request PCP to please consider that if appropriate.    Patient admitted to the hallway with his walker, he uses a walker at home, he refused Home PT.   Mild hyponatremia due to dehydration treated with IV fluids, IV fluids have been stopped we'll request PCP to repeat BMP in 3-4 days.   Clinical appearance of malnutrition mild to moderate. Nutritionist consulted. And given protein supplements on discharge   Today   Subjective:   Anthony [name] today has no headache,no chest abdominal pain,no new weakness tingling or numbness, feels much better wants to go home today.    Objective:   Blood pressure 123/63, pulse 84, temperature 98.2 F (36.8 C), temperature source Oral, resp. rate 18, height 5\' 10"  (1.778 m),  weight 46.2 kg (101 lb 13.6 oz), SpO2 100.00%.   Intake/Output Summary (Last 24 hours) at 12/30/12 0918 Last data filed at 12/30/12 0415  Gross per 24 hour  Intake 701.67 ml  Output   1125 ml  Net -423.33 ml    Exam Awake Alert, Oriented *3, No new F.N deficits, Normal affect [place].AT,PERRAL Supple Neck,No JVD, No cervical lymphadenopathy appriciated.  Symmetrical Chest wall movement, Good air movement bilaterally, CTAB RRR,No Gallops,Rubs or new Murmurs, No Parasternal Heave +ve B.Sounds, Abd Soft, Non tender, No organomegaly appriciated, No rebound -guarding or rigidity. No Cyanosis, Clubbing or edema, No new Rash or bruise  Data Review   Major procedures and Radiology Reports - PLEASE review detailed and final reports for all details in brief -       Dg Chest 2 View  12/28/2012  *RADIOLOGY REPORT*  Clinical Data: Short of breath, COPD  CHEST - 2 VIEW  Comparison: Chest radiograph 01/13/2011  Findings: Normal mediastinum and heart silhouette.  Lungs are hyperinflated.  There is there is scarring at the lung bases.  No effusion, infiltrate, pneumothorax.  There is some endosteal scalloping within the right humerus proximally.  IMPRESSION:  1.  Severe emphysematous change.  No acute cardiopulmonary findings. 2.  Subtle endosteal scalloping in the right humerus.  This may be benign finding but recommend biochemical correlation with myeloma.   Original Report Authenticated By: Genevive Bi, M.D.    Abd 1 View (kub)  12/29/2012  *RADIOLOGY REPORT*  Clinical Data: Mid abdominal pain.  ABDOMEN - 1 VIEW  Comparison: CT abdomen and pelvis with contrast 08/28/2003.  Findings: Right greater than left pleural effusions are present. There is mild gaseous distention of bowel without evidence for obstruction or free air.  Degenerative changes are noted in the lumbar spine.  IMPRESSION:  1.  Mild gaseous distention of bowel without evidence for obstruction or free air.   Original Report  Authenticated By: Marin Roberts, M.D.    Ct Head Wo Contrast  12/29/2012  *RADIOLOGY REPORT*  Clinical Data: Frontal headache.  Extremity numbness and weakness.  CT HEAD WITHOUT CONTRAST  Technique:  Contiguous axial images were obtained from the base of the skull through the vertex without contrast.  Comparison: None.  Findings: The brain shows extensive low density affecting the hemispheric white matter consistent with advanced chronic small vessel disease.  There is a focal low density in the right thalamus consistent with an old lacunar infarction.  There is no identifiably acute infarction.  No large vessel territory infarction.  No mass lesion, hemorrhage, hydrocephalus or extra- axial collection.  Certainly, an acute small vessel insult  could be hidden amongst these extensive chronic changes.  No calvarial abnormality.  The patient has had previous ocular surgery on the right.  There has been previous surgery affecting the anterior wall the right maxillary sinus.  The right maxillary sinus is nearly completely opacified and there is mucoperiosteal thickening, suggesting chronic right maxillary sinusitis.  The other visualized sinuses are clear.  IMPRESSION: No identifiably acute intracranial finding.  Extensive low density throughout the hemispheric white matter consistent with chronic small vessel disease.  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  Certainly, an acute or subacute insult could be hidden amongst these extensive chronic changes.  Right maxillary sinus opacification and mucoperiosteal thickening consistent with chronic right maxillary sinusitis.   Original Report Authenticated By: Paulina Fusi, M.D.    Dg Bone Survey Met  12/29/2012  *RADIOLOGY REPORT*  Clinical Data: History COPD, sickle cell disease, smoking history, evaluate for multiple myeloma lesions  METASTATIC BONE SURVEY  Comparison: None.  Findings: A lateral view of the skull shows no radiolucent  defect. Surgical sutures overlying the region of the orbits.  The cervical vertebrae are in normal alignment.  There is degenerative disc disease at C3-4 and C4-5.  The lower cervical spine is not well seen.  No compression deformity is seen.  There are degenerative changes throughout the thoracic vertebra. No compression deformity is seen, with a slight thoracic kyphosis present.  The lumbar vertebrae are straightened alignment.  There is degenerative disc disease at L4-5 and L5-S1.  No compression deformity is seen.  No radiolucent lesion is seen of the pelvis.  There are sclerotic foci in both proximal femurs and the femoral heads most consistent with bone infarcts in this patient with sickle cell disease.  Views of both femurs show additional sclerotic lesions most consistent with bone infarcts.  Views of the right shoulder and right humerus show only questionable radiolucent lesion in the mid right humerus.  This is difficult to assess in view of the overlapping changes of sickle cell disease.  Views of the left shoulder left humerus also show scattered sclerotic foci most consistent with bony changes of sickle cell disease.  No definite radiolucent lesion is seen.  Chest x-ray shows the lungs to be hyperaerated consistent with emphysema. Blunting of the right, and angle most likely is due to pleural thickening versus pleural effusion.  Mediastinal contours appear normal.  The heart is within normal limits in size.  IMPRESSION:  1.  Primarily changes of sickle cell disease involving the bony skeleton with sclerotic foci most consistent with bone infarcts. 2.  The only questionable radiolucent lesion is within the proximal right humerus which is difficult to assess due to the superimposed sclerotic foci.  3.  Emphysema.  Probable chronic change at the lung bases.   Original Report Authenticated By: Dwyane Dee, M.D.     Micro Results      Recent Results (from the past 240 hour(s))  CULTURE, EXPECTORATED  SPUTUM-ASSESSMENT     Status: None   Collection Time    12/29/12 12:34 PM      Result Value Range Status   Specimen Description SPUTUM   Final   Special Requests NONE   Final   Sputum evaluation     Final   Value: THIS SPECIMEN IS ACCEPTABLE. RESPIRATORY CULTURE REPORT TO FOLLOW.   Report Status 12/29/2012 FINAL   Final  CULTURE, RESPIRATORY (NON-EXPECTORATED)     Status: None   Collection Time  12/29/12 12:34 PM      Result Value Range Status   Specimen Description SPUTUM   Final   Special Requests NONE   Final   Gram Stain     Final   Value: RARE WBC PRESENT, PREDOMINANTLY PMN     MODERATE SQUAMOUS EPITHELIAL CELLS PRESENT     ABUNDANT GRAM POSITIVE COCCI IN CLUSTERS     IN PAIRS IN CHAINS FEW GRAM POSITIVE RODS   Culture Culture reincubated for better growth   Final   Report Status PENDING   Incomplete     CBC w Diff: Lab Results  Component Value Date   WBC 5.3 12/28/2012   HGB 9.2* 12/29/2012   HCT 29.4* 12/29/2012   PLT 233 12/28/2012   LYMPHOPCT 5* 01/12/2011   BANDSPCT 0 08/14/2010   MONOPCT 3 01/12/2011   EOSPCT 0 01/12/2011   BASOPCT 0 01/12/2011    CMP: Lab Results  Component Value Date   NA 127* 12/28/2012   K 3.6 12/28/2012   CL 89* 12/28/2012   CO2 29 12/28/2012   BUN 8 12/28/2012   CREATININE 1.25 12/28/2012   PROT 6.8 01/09/2011   ALBUMIN 3.6 01/09/2011   BILITOT 0.7 01/09/2011   ALKPHOS 58 01/09/2011   AST 21 01/09/2011   ALT 9 01/09/2011  .  Lab Results  Component Value Date   INR 1.20 12/30/2012   INR 1.19 12/28/2012   INR 1.62* 01/15/2011    Discharge Instructions     You will need a repeat x-ray of right humerus in 2 weeks, please request your physician to do outpatient workup for multiple myeloma.  Follow with Primary MD Dorrene German, MD in 3 days , Get INR check 2/week by your Primary MD for 2 weeks.  Get CBC, CMP, INR checked 3 days by Primary MD and again as instructed by your Primary MD. Get a 2 view Chest X ray done next visit along with X Ray  of Right Humerus next visit.  Get Medicines reviewed and adjusted.  Please request your Prim.MD to go over all Hospital Tests and Procedure/Radiological results at the follow up, please get all Hospital records sent to your Prim MD by signing hospital release before you go home.  Activity: As tolerated with Full fall precautions use walker/cane & assistance as needed   Diet: Heart Healthy  For Heart failure patients - Check your Weight same time everyday, if you gain over 2 pounds, or you develop in leg swelling, experience more shortness of breath or chest pain, call your Primary MD immediately. Follow Cardiac Low Salt Diet and 1.8 lit/day fluid restriction.  Disposition Home   If you experience worsening of your admission symptoms, develop shortness of breath, life threatening emergency, suicidal or homicidal thoughts you must seek medical attention immediately by calling 911 or calling your MD immediately  if symptoms less severe.  You Must read complete instructions/literature along with all the possible adverse reactions/side effects for all the Medicines you take and that have been prescribed to you. Take any new Medicines after you have completely understood and accpet all the possible adverse reactions/side effects.   Do not drive and provide baby sitting services if your were admitted for syncope or siezures until you have seen by Primary MD or a Neurologist and advised to do so again.  Do not drive when taking Pain medications.    Do not take more than prescribed Pain, Sleep and Anxiety Medications  Special Instructions: If you have smoked or  chewed Tobacco  in the last 2 yrs please stop smoking, stop any regular Alcohol  and or any Recreational drug use.  Wear Seat belts while driving.      Follow-up Information   Follow up with AVBUERE,EDWIN A, MD. Schedule an appointment as soon as possible for a visit in 1 week.   Contact information:   [address] [place]  [place] [address] [phone]       Follow up with Dentist of choice. Schedule an appointment as soon as possible for a visit in 1 week.        Discharge Medications     Medication List    TAKE these medications       albuterol (2.5 MG/3ML) 0.083% nebulizer solution  Commonly known as:  PROVENTIL  Take 2.5 mg by nebulization every 6 (six) hours as needed (wheezing).     albuterol-ipratropium 18-103 MCG/ACT inhaler  Commonly known as:  COMBIVENT  Inhale 2 puffs into the lungs 4 (four) times daily.     beclomethasone 80 MCG/ACT inhaler  Commonly known as:  QVAR  Inhale 2 puffs into the lungs 2 (two) times daily.     cetirizine 10 MG chewable tablet  Commonly known as:  ZYRTEC CHILDRENS ALLERGY  Chew 1 tablet (10 mg total) by mouth daily.     fluticasone 50 MCG/ACT nasal spray  Commonly known as:  FLONASE  Place 2 sprays into the nose daily.     fluticasone 50 MCG/ACT nasal spray  Commonly known as:  FLONASE  Place 2 sprays into the nose daily.     HYDROcodone-acetaminophen 5-500 MG per tablet  Commonly known as:  VICODIN  Take 1 tablet by mouth every 6 (six) hours as needed (pain).     lactose free nutrition Liqd  Take 237 mLs by mouth 3 (three) times daily with meals.     levofloxacin 250 MG tablet  Commonly known as:  LEVAQUIN  Take 1 tablet (250 mg total) by mouth daily.     pantoprazole 40 MG tablet  Commonly known as:  PROTONIX  Take 40 mg by mouth every evening.     predniSONE 5 MG tablet  Commonly known as:  DELTASONE  Label  & dispense according to the schedule below. 10 Pills PO for 3 days then, 8 Pills PO for 3 days, 6 Pills PO for 3 days, 4 Pills PO for 3 days, 2 Pills PO for 3 days, 1 Pills PO for 3 days, 1/2 Pill  PO for 3 days then STOP.     tiotropium 18 MCG inhalation capsule  Commonly known as:  SPIRIVA  Place 18 mcg into inhaler and inhale daily.     Vitamin D (Ergocalciferol) 50000 UNITS Caps  Commonly known as:  DRISDOL  Take 50,000 Units by  mouth every 7 (seven) days. On mondays     warfarin 5 MG tablet  Commonly known as:  COUMADIN  Take 2 tablets (10 mg total) by mouth every evening. Get INR check 2/week by your Primary MD for 2 weeks.           Total Time in preparing paper work, data evaluation and todays exam - 35 minutes  Leroy Sea M.D on 12/30/2012 at 9:18 AM  [facility] Office  [phone]

## 2012-12-30 NOTE — Progress Notes (Signed)
Assessment unchanged. Pt verbalized understanding of dc instructions including follow-up care with primary MD, diet to continue, activity to resume, and all meds to resume. Reviewed AVS and introduced to My Chart yet pt currently doesn't have computer. Multiple scripts given and reviewed as provided by MD. Pt currently awaiting friend to arrive with pt's home O2 tank and to transport pt home.

## 2012-12-31 LAB — CULTURE, RESPIRATORY W GRAM STAIN

## 2013-01-01 LAB — TYPE AND SCREEN
ABO/RH(D): A POS
Antibody Screen: NEGATIVE
Unit division: 0
Unit division: 0

## 2013-02-28 ENCOUNTER — Encounter (HOSPITAL_COMMUNITY): Payer: Self-pay

## 2013-02-28 ENCOUNTER — Inpatient Hospital Stay (HOSPITAL_COMMUNITY)
Admission: EM | Admit: 2013-02-28 | Discharge: 2013-03-09 | DRG: 438 | Disposition: A | Discharge status: Skilled Nursing Facility | Payer: PRIVATE HEALTH INSURANCE | Attending: Internal Medicine | Admitting: Internal Medicine

## 2013-02-28 DIAGNOSIS — K5730 Diverticulosis of large intestine without perforation or abscess without bleeding: Secondary | ICD-10-CM | POA: Diagnosis present

## 2013-02-28 DIAGNOSIS — Z86718 Personal history of other venous thrombosis and embolism: Secondary | ICD-10-CM

## 2013-02-28 DIAGNOSIS — Z681 Body mass index (BMI) 19 or less, adult: Secondary | ICD-10-CM

## 2013-02-28 DIAGNOSIS — J438 Other emphysema: Secondary | ICD-10-CM

## 2013-02-28 DIAGNOSIS — K2970 Gastritis, unspecified, without bleeding: Secondary | ICD-10-CM | POA: Diagnosis present

## 2013-02-28 DIAGNOSIS — J441 Chronic obstructive pulmonary disease with (acute) exacerbation: Secondary | ICD-10-CM | POA: Diagnosis present

## 2013-02-28 DIAGNOSIS — D62 Acute posthemorrhagic anemia: Secondary | ICD-10-CM | POA: Diagnosis present

## 2013-02-28 DIAGNOSIS — Q2733 Arteriovenous malformation of digestive system vessel: Secondary | ICD-10-CM

## 2013-02-28 DIAGNOSIS — D649 Anemia, unspecified: Secondary | ICD-10-CM

## 2013-02-28 DIAGNOSIS — Z9981 Dependence on supplemental oxygen: Secondary | ICD-10-CM

## 2013-02-28 DIAGNOSIS — K648 Other hemorrhoids: Secondary | ICD-10-CM | POA: Diagnosis present

## 2013-02-28 DIAGNOSIS — K644 Residual hemorrhoidal skin tags: Secondary | ICD-10-CM | POA: Diagnosis present

## 2013-02-28 DIAGNOSIS — J449 Chronic obstructive pulmonary disease, unspecified: Secondary | ICD-10-CM | POA: Diagnosis present

## 2013-02-28 DIAGNOSIS — J4489 Other specified chronic obstructive pulmonary disease: Secondary | ICD-10-CM

## 2013-02-28 DIAGNOSIS — J9610 Chronic respiratory failure, unspecified whether with hypoxia or hypercapnia: Secondary | ICD-10-CM | POA: Diagnosis present

## 2013-02-28 DIAGNOSIS — K2990 Gastroduodenitis, unspecified, without bleeding: Secondary | ICD-10-CM | POA: Diagnosis present

## 2013-02-28 DIAGNOSIS — J309 Allergic rhinitis, unspecified: Secondary | ICD-10-CM

## 2013-02-28 DIAGNOSIS — E43 Unspecified severe protein-calorie malnutrition: Secondary | ICD-10-CM | POA: Diagnosis present

## 2013-02-28 DIAGNOSIS — D371 Neoplasm of uncertain behavior of stomach: Secondary | ICD-10-CM | POA: Diagnosis present

## 2013-02-28 DIAGNOSIS — D571 Sickle-cell disease without crisis: Secondary | ICD-10-CM

## 2013-02-28 DIAGNOSIS — Z7901 Long term (current) use of anticoagulants: Secondary | ICD-10-CM

## 2013-02-28 DIAGNOSIS — F17200 Nicotine dependence, unspecified, uncomplicated: Secondary | ICD-10-CM | POA: Diagnosis present

## 2013-02-28 DIAGNOSIS — R64 Cachexia: Secondary | ICD-10-CM | POA: Diagnosis present

## 2013-02-28 DIAGNOSIS — I2789 Other specified pulmonary heart diseases: Secondary | ICD-10-CM

## 2013-02-28 DIAGNOSIS — Z86711 Personal history of pulmonary embolism: Secondary | ICD-10-CM

## 2013-02-28 DIAGNOSIS — R109 Unspecified abdominal pain: Secondary | ICD-10-CM | POA: Diagnosis present

## 2013-02-28 DIAGNOSIS — K859 Acute pancreatitis without necrosis or infection, unspecified: Principal | ICD-10-CM

## 2013-02-28 DIAGNOSIS — K31811 Angiodysplasia of stomach and duodenum with bleeding: Secondary | ICD-10-CM | POA: Diagnosis present

## 2013-02-28 HISTORY — DX: Other pulmonary embolism without acute cor pulmonale: I26.99

## 2013-02-28 LAB — COMPREHENSIVE METABOLIC PANEL
ALT: 9 U/L (ref 0–53)
AST: 25 U/L (ref 0–37)
Calcium: 8.8 mg/dL (ref 8.4–10.5)
GFR calc Af Amer: 78 mL/min — ABNORMAL LOW (ref >90)
Glucose, Bld: 97 mg/dL (ref 70–99)
Sodium: 133 mEq/L — ABNORMAL LOW (ref 135–145)
Total Protein: 7.5 g/dL (ref 6.0–8.3)

## 2013-02-28 LAB — CBC WITH DIFFERENTIAL/PLATELET
Eosinophils Absolute: 0.2 10*3/uL (ref 0.0–0.7)
MCH: 19.2 pg — ABNORMAL LOW (ref 26.0–34.0)
MCHC: 33.3 g/dL (ref 30.0–36.0)
Monocytes Absolute: 0.4 10*3/uL (ref 0.1–1.0)
Neutrophils Relative %: 63 % (ref 43–77)
Platelets: 246 10*3/uL (ref 150–400)
RDW: 27.7 % — ABNORMAL HIGH (ref 11.5–15.5)

## 2013-02-28 LAB — URINALYSIS, ROUTINE W REFLEX MICROSCOPIC
Hgb urine dipstick: NEGATIVE
Leukocytes, UA: NEGATIVE
Specific Gravity, Urine: 1.008 (ref 1.005–1.030)
Urobilinogen, UA: 0.2 mg/dL (ref 0.0–1.0)

## 2013-02-28 LAB — PROTIME-INR
INR: 1.03 (ref 0.00–1.49)
Prothrombin Time: 13.4 seconds (ref 11.6–15.2)

## 2013-02-28 MED ORDER — MORPHINE SULFATE 4 MG/ML IJ SOLN
4.0000 mg | Freq: Once | INTRAMUSCULAR | Status: AC
  Administered 2013-02-28: 4 mg via INTRAVENOUS
  Filled 2013-02-28: qty 1

## 2013-02-28 MED ORDER — ALBUTEROL SULFATE (5 MG/ML) 0.5% IN NEBU
5.0000 mg | INHALATION_SOLUTION | Freq: Once | RESPIRATORY_TRACT | Status: AC
  Administered 2013-02-28: 5 mg via RESPIRATORY_TRACT
  Filled 2013-02-28: qty 1

## 2013-02-28 MED ORDER — IPRATROPIUM BROMIDE 0.02 % IN SOLN
0.5000 mg | Freq: Once | RESPIRATORY_TRACT | Status: AC
  Administered 2013-02-28: 0.5 mg via RESPIRATORY_TRACT
  Filled 2013-02-28: qty 2.5

## 2013-02-28 NOTE — ED Notes (Signed)
Pt here with abdominal pain x 4 days, headache and HTN.  BP with EMS 177/94.  Pt localizes abdominal pain to lower abdomen.  Rates pain 8/10.  States it "just hurts; it's just there".  Pt has hx of sickle cell.  Nad.

## 2013-02-28 NOTE — ED Provider Notes (Signed)
History     CSN: [id number]  Arrival date & time 02/28/13  2127   First MD Initiated Contact with Patient 02/28/13 2208      Chief Complaint  Patient presents with  . Abdominal Pain    (Consider location/radiation/quality/duration/timing/severity/associated sxs/prior treatment) The history is provided by the patient and medical records. No language interpreter was used.    Patient with PMH sickle cell trait, emphysema on 3 l oxygen at home, and previous PE who present with abdominal pain. Patient states that his pain is epigastric.  He has decreased appetite and some nausea. Patient denies vomitng, but has had severely decreased oral fluid intake.  Patient states that he did try to eat half a sandwich earlier today was only able to hold down a portion of that.  Patient has also had increased difficulty breathing.  He has a past medical history pulmonary embolism.  He is always subtherapeutic on his INR is 50 he does take his Coumadin as directed.  He denies any recent swelling in his legs.  He does have some wheezing and shortness of breath.  He did not take his albuterol at home.  Patient also has a past medical history significant for chronic anemia.  He did have to have a blood transfusion in February of 2014.  He denies any melena or hematochezia.  Denies fevers, chills, myalgias, arthralgias. Denies chest pressure, radiation to left arm, jaw or back, or diaphoresis. Denies dysuria, flank pain, suprapubic pain, frequency, urgency, or hematuria. Denies headaches, light headedness, weakness, visual disturbances.   Past Medical History  Diagnosis Date  . COPD (chronic obstructive pulmonary disease)   . Sickle cell anemia     History reviewed. No pertinent past surgical history.  History reviewed. No pertinent family history.  History  Substance Use Topics  . Smoking status: Light Tobacco Smoker  . Smokeless tobacco: Not on file  . Alcohol Use: No      Review of Systems Ten  systems reviewed and are negative for acute change, except as noted in the HPI.   Allergies  Review of patient's allergies indicates no known allergies.  Home Medications   Current Outpatient Rx  Name  Route  Sig  Dispense  Refill  . albuterol (PROVENTIL) (2.5 MG/3ML) 0.083% nebulizer solution   Nebulization   Take 2.5 mg by nebulization every 6 (six) hours as needed (wheezing).         Marland Kitchen albuterol-ipratropium (COMBIVENT) 18-103 MCG/ACT inhaler   Inhalation   Inhale 2 puffs into the lungs 4 (four) times daily.         Marland Kitchen ENSURE (ENSURE)   Oral   Take 237 mLs by mouth 3 (three) times daily between meals.         . fluticasone (FLONASE) 50 MCG/ACT nasal spray   Nasal   Place 2 sprays into the nose daily.   16 g   0   . HYDROcodone-acetaminophen (VICODIN) 5-500 MG per tablet   Oral   Take 1 tablet by mouth every 6 (six) hours as needed for pain.         Marland Kitchen tiotropium (SPIRIVA) 18 MCG inhalation capsule   Inhalation   Place 18 mcg into inhaler and inhale daily.         . Vitamin D, Ergocalciferol, (DRISDOL) 50000 UNITS CAPS   Oral   Take 50,000 Units by mouth every 7 (seven) days. On mondays         . warfarin (COUMADIN) 5 MG tablet  Oral   Take 2 tablets (10 mg total) by mouth every evening. Get INR check 2/week by your Primary MD for 2 weeks.   5 tablet   0     BP 177/71  Pulse 97  Temp(Src) 97 F (36.1 C) (Oral)  Resp 24  SpO2 100%  Physical Exam Physical Exam  Nursing note and vitals reviewed. Constitutional: Cachectic-appearing male.  He is tripoding in his bed.  Accessory muscle use.  Dyspneic and speaking in 2-3 word sentences.  Complains of pain and difficulty breathing HENT:  Head: Normocephalic and atraumatic.  very dry oral mucosa dry cracked lips. Eyes: Conjunctivae normal are normal. No scleral icterus.  Neck: Normal range of motion. Neck supple.  Cardiovascular: Tachycardic regular rhythm and normal heart sounds.   Pulmonary/Chest:  Bilateral wheezing in the lungs.   Abdominal: Soft.  Tenderness to palpation in the epigastrium.  No peritoneal signs .no distention  Musculoskeletal: He exhibits no edema.  Neurological: He is alert.  Skin: Skin is warm and dry. He is not diaphoretic.  Psychiatric: His behavior is normal.    ED Course  Procedures (including critical care time)  Labs Reviewed  CBC WITH DIFFERENTIAL - Abnormal; Notable for the following:    RBC 4.17 (*)    Hemoglobin 8.0 (*)    HCT 24.0 (*)    MCV 57.6 (*)    MCH 19.2 (*)    RDW 27.7 (*)    All other components within normal limits  COMPREHENSIVE METABOLIC PANEL - Abnormal; Notable for the following:    Sodium 133 (*)    Chloride 95 (*)    GFR calc non Af Amer 67 (*)    GFR calc Af Amer 78 (*)    All other components within normal limits  LIPASE, BLOOD - Abnormal; Notable for the following:    Lipase 157 (*)    All other components within normal limits  PROTIME-INR  URINALYSIS, ROUTINE W REFLEX MICROSCOPIC   No results found.   1. Pancreatitis       MDM  BP 170/87  Pulse 90  Temp(Src) 97 F (36.1 C) (Oral)  Resp 16  SpO2 100% Patient with wheezing.  He may have pulmonary embolus versus COPD exacerbation and or pneumonia.  Patient appears to have difficulty breathing but does not appear toxic.  He is mildly tachycardic.  I have ORDERED duoneb treatment  And labs.   1:17 AM Patient with elevated Lipase. 77 y/o and no past history. Continues to c/o abdominal pain and request pain medication.  Wheezing has resolved and the patient appears to be breathing much more easily.  He is no longer tripoding.  Speaking in full sentences.   =I have spoken with Dr. Toniann Fail who will admit the patient for his pancreatitis. I have placed orders for CT and CXR. The patient appears reasonably stabilized for admission considering the current resources, flow, and capabilities available in the ED at this time, and I doubt any other [facility] requiring  further screening and/or treatment in the ED prior to admission.      Arthor Captain, PA-C 03/01/13 1908

## 2013-03-01 ENCOUNTER — Observation Stay (HOSPITAL_COMMUNITY): Payer: PRIVATE HEALTH INSURANCE

## 2013-03-01 ENCOUNTER — Inpatient Hospital Stay (HOSPITAL_COMMUNITY): Payer: PRIVATE HEALTH INSURANCE

## 2013-03-01 ENCOUNTER — Encounter (HOSPITAL_COMMUNITY): Payer: Self-pay | Admitting: *Deleted

## 2013-03-01 DIAGNOSIS — K859 Acute pancreatitis without necrosis or infection, unspecified: Principal | ICD-10-CM

## 2013-03-01 DIAGNOSIS — J449 Chronic obstructive pulmonary disease, unspecified: Secondary | ICD-10-CM

## 2013-03-01 DIAGNOSIS — D571 Sickle-cell disease without crisis: Secondary | ICD-10-CM

## 2013-03-01 DIAGNOSIS — D649 Anemia, unspecified: Secondary | ICD-10-CM

## 2013-03-01 DIAGNOSIS — R109 Unspecified abdominal pain: Secondary | ICD-10-CM

## 2013-03-01 LAB — RAPID URINE DRUG SCREEN, HOSP PERFORMED
Amphetamines: NOT DETECTED
Cocaine: NOT DETECTED
Opiates: POSITIVE — AB
Tetrahydrocannabinol: NOT DETECTED

## 2013-03-01 LAB — CBC WITH DIFFERENTIAL/PLATELET
Basophils Absolute: 0.1 10*3/uL (ref 0.0–0.1)
Eosinophils Absolute: 0.3 10*3/uL (ref 0.0–0.7)
Lymphocytes Relative: 30 % (ref 12–46)
MCHC: 33 g/dL (ref 30.0–36.0)
Monocytes Relative: 7 % (ref 3–12)
Platelets: 229 10*3/uL (ref 150–400)
RDW: 27.5 % — ABNORMAL HIGH (ref 11.5–15.5)
WBC: 6.6 10*3/uL (ref 4.0–10.5)

## 2013-03-01 LAB — GLUCOSE, CAPILLARY: Glucose-Capillary: 117 mg/dL — ABNORMAL HIGH (ref 70–99)

## 2013-03-01 LAB — HEPATIC FUNCTION PANEL
ALT: 8 U/L (ref 0–53)
Alkaline Phosphatase: 54 U/L (ref 39–117)
Bilirubin, Direct: 0.1 mg/dL (ref 0.0–0.3)
Indirect Bilirubin: 0.3 mg/dL (ref 0.3–0.9)
Total Bilirubin: 0.4 mg/dL (ref 0.3–1.2)

## 2013-03-01 LAB — BASIC METABOLIC PANEL
Calcium: 8.9 mg/dL (ref 8.4–10.5)
Creatinine, Ser: 1 mg/dL (ref 0.50–1.35)
GFR calc Af Amer: 83 mL/min — ABNORMAL LOW (ref >90)
GFR calc non Af Amer: 71 mL/min — ABNORMAL LOW (ref >90)

## 2013-03-01 LAB — PROTIME-INR
INR: 1.2 (ref 0.00–1.49)
Prothrombin Time: 15 seconds (ref 11.6–15.2)

## 2013-03-01 LAB — LIPID PANEL
LDL Cholesterol: 35 mg/dL (ref 0–99)
Total CHOL/HDL Ratio: 1.2 RATIO

## 2013-03-01 LAB — LIPASE, BLOOD: Lipase: 86 U/L — ABNORMAL HIGH (ref 11–59)

## 2013-03-01 MED ORDER — SODIUM CHLORIDE 0.9 % IV SOLN
INTRAVENOUS | Status: AC
  Administered 2013-03-02: 03:00:00 via INTRAVENOUS

## 2013-03-01 MED ORDER — HEPARIN BOLUS VIA INFUSION
2500.0000 [IU] | Freq: Once | INTRAVENOUS | Status: AC
  Administered 2013-03-01: 2500 [IU] via INTRAVENOUS
  Filled 2013-03-01: qty 2500

## 2013-03-01 MED ORDER — ALBUTEROL SULFATE (5 MG/ML) 0.5% IN NEBU: 5.0000 mg | INHALATION_SOLUTION | RESPIRATORY_TRACT | Status: DC | PRN

## 2013-03-01 MED ORDER — WARFARIN - PHARMACIST DOSING INPATIENT: Freq: Every day | Status: DC

## 2013-03-01 MED ORDER — PANTOPRAZOLE SODIUM 40 MG IV SOLR
40.0000 mg | INTRAVENOUS | Status: DC
  Administered 2013-03-01: 40 mg via INTRAVENOUS
  Filled 2013-03-01 (×2): qty 40

## 2013-03-01 MED ORDER — ONDANSETRON HCL 4 MG/2ML IJ SOLN: 4.0000 mg | Freq: Three times a day (TID) | INTRAMUSCULAR | Status: DC | PRN

## 2013-03-01 MED ORDER — TAMSULOSIN HCL 0.4 MG PO CAPS
0.4000 mg | ORAL_CAPSULE | Freq: Every day | ORAL | Status: DC
  Administered 2013-03-01 – 2013-03-07 (×7): 0.4 mg via ORAL
  Filled 2013-03-01 (×9): qty 1

## 2013-03-01 MED ORDER — ACETAMINOPHEN 650 MG RE SUPP: 650.0000 mg | Freq: Four times a day (QID) | RECTAL | Status: DC | PRN

## 2013-03-01 MED ORDER — SODIUM CHLORIDE 0.9 % IV SOLN: Freq: Once | INTRAVENOUS | Status: DC

## 2013-03-01 MED ORDER — FLUTICASONE PROPIONATE 50 MCG/ACT NA SUSP
2.0000 | Freq: Every day | NASAL | Status: DC
  Administered 2013-03-01 – 2013-03-09 (×9): 2 via NASAL
  Filled 2013-03-01: qty 16

## 2013-03-01 MED ORDER — SODIUM CHLORIDE 0.9 % IJ SOLN
3.0000 mL | Freq: Two times a day (BID) | INTRAMUSCULAR | Status: DC
  Administered 2013-03-01 – 2013-03-09 (×14): 3 mL via INTRAVENOUS

## 2013-03-01 MED ORDER — HEPARIN (PORCINE) IN NACL 100-0.45 UNIT/ML-% IJ SOLN
1100.0000 [IU]/h | INTRAMUSCULAR | Status: DC
  Administered 2013-03-01 – 2013-03-02 (×2): 850 [IU]/h via INTRAVENOUS
  Administered 2013-03-03: 800 [IU]/h via INTRAVENOUS
  Administered 2013-03-05: 900 [IU]/h via INTRAVENOUS
  Administered 2013-03-06: 1100 [IU]/h via INTRAVENOUS
  Filled 2013-03-01 (×11): qty 250

## 2013-03-01 MED ORDER — ONDANSETRON HCL 4 MG PO TABS: 4.0000 mg | ORAL_TABLET | Freq: Four times a day (QID) | ORAL | Status: DC | PRN

## 2013-03-01 MED ORDER — IPRATROPIUM BROMIDE 0.02 % IN SOLN
0.5000 mg | RESPIRATORY_TRACT | Status: DC
  Administered 2013-03-01 – 2013-03-09 (×49): 0.5 mg via RESPIRATORY_TRACT
  Filled 2013-03-01 (×46): qty 2.5

## 2013-03-01 MED ORDER — IOHEXOL 300 MG/ML  SOLN
25.0000 mL | INTRAMUSCULAR | Status: DC
  Administered 2013-03-01: 25 mL via ORAL

## 2013-03-01 MED ORDER — MORPHINE SULFATE 4 MG/ML IJ SOLN
4.0000 mg | Freq: Once | INTRAMUSCULAR | Status: AC
  Administered 2013-03-01: 4 mg via INTRAVENOUS
  Filled 2013-03-01: qty 1

## 2013-03-01 MED ORDER — MORPHINE SULFATE 4 MG/ML IJ SOLN: 4.0000 mg | Freq: Once | INTRAMUSCULAR | Status: DC

## 2013-03-01 MED ORDER — ALBUTEROL SULFATE (5 MG/ML) 0.5% IN NEBU
2.5000 mg | INHALATION_SOLUTION | RESPIRATORY_TRACT | Status: DC
  Administered 2013-03-01 – 2013-03-09 (×50): 2.5 mg via RESPIRATORY_TRACT
  Filled 2013-03-01 (×47): qty 0.5

## 2013-03-01 MED ORDER — IOHEXOL 300 MG/ML  SOLN
100.0000 mL | Freq: Once | INTRAMUSCULAR | Status: AC | PRN
  Administered 2013-03-01: 100 mL via INTRAVENOUS

## 2013-03-01 MED ORDER — CIPROFLOXACIN IN D5W 400 MG/200ML IV SOLN
400.0000 mg | Freq: Two times a day (BID) | INTRAVENOUS | Status: DC
  Administered 2013-03-01 – 2013-03-04 (×7): 400 mg via INTRAVENOUS
  Filled 2013-03-01 (×11): qty 200

## 2013-03-01 MED ORDER — ALBUTEROL SULFATE (5 MG/ML) 0.5% IN NEBU
2.5000 mg | INHALATION_SOLUTION | RESPIRATORY_TRACT | Status: DC | PRN
  Administered 2013-03-04 – 2013-03-09 (×3): 2.5 mg via RESPIRATORY_TRACT
  Filled 2013-03-01 (×3): qty 0.5

## 2013-03-01 MED ORDER — ONDANSETRON HCL 4 MG/2ML IJ SOLN: 4.0000 mg | Freq: Four times a day (QID) | INTRAMUSCULAR | Status: DC | PRN

## 2013-03-01 MED ORDER — HYDROMORPHONE HCL PF 1 MG/ML IJ SOLN
1.0000 mg | INTRAMUSCULAR | Status: DC | PRN
  Administered 2013-03-01 – 2013-03-03 (×2): 1 mg via INTRAVENOUS
  Filled 2013-03-01 (×2): qty 1

## 2013-03-01 MED ORDER — ACETAMINOPHEN 325 MG PO TABS
650.0000 mg | ORAL_TABLET | Freq: Four times a day (QID) | ORAL | Status: DC | PRN
  Administered 2013-03-01 – 2013-03-08 (×4): 650 mg via ORAL
  Filled 2013-03-01 (×4): qty 2

## 2013-03-01 MED ORDER — WARFARIN SODIUM 10 MG PO TABS
10.0000 mg | ORAL_TABLET | Freq: Once | ORAL | Status: AC
  Administered 2013-03-01: 10 mg via ORAL
  Filled 2013-03-01: qty 1

## 2013-03-01 MED ORDER — HYDROMORPHONE HCL PF 1 MG/ML IJ SOLN
1.0000 mg | INTRAMUSCULAR | Status: DC | PRN
  Administered 2013-03-01: 1 mg via INTRAVENOUS
  Filled 2013-03-01: qty 1

## 2013-03-01 NOTE — Progress Notes (Addendum)
ANTICOAGULATION CONSULT NOTE - Initial Consult  Pharmacy Consult for heparin, Coumadin  Indication: H/o pulmonary embolism   No Known Allergies  Patient Measurements: Height: 5\' 10"  (177.8 cm) Weight: 108 lb 0.4 oz (49 kg) IBW/kg (Calculated) : 73  Vital Signs: Temp: 97.3 F (36.3 C) (05/01 0553) Temp src: Oral (05/01 0553) BP: 146/74 mmHg (05/01 0553) Pulse Rate: 91 (05/01 0553)  Labs:  Recent Labs  02/28/13 2142 02/28/13 2241  HGB 8.0*  --   HCT 24.0*  --   PLT 246  --   LABPROT  --  13.4  INR  --  1.03  CREATININE 1.05  --     Estimated Creatinine Clearance: 42.1 ml/min (by C-G formula based on Cr of 1.05).   Medical History: Past Medical History  Diagnosis Date  . COPD (chronic obstructive pulmonary disease)   . Sickle cell anemia   . Pulmonary embolism     Medications:  Scheduled:  . albuterol  2.5 mg Nebulization Q4H  . [COMPLETED] albuterol  5 mg Nebulization Once  . fluticasone  2 spray Each Nare Daily  . [COMPLETED] ipratropium  0.5 mg Nebulization Once  . ipratropium  0.5 mg Nebulization Q4H  . [COMPLETED]  morphine injection  4 mg Intravenous Once  . [COMPLETED]  morphine injection  4 mg Intravenous Once  . pantoprazole (PROTONIX) IV  40 mg Intravenous Q24H  . sodium chloride  3 mL Intravenous Q12H  . [DISCONTINUED] sodium chloride   Intravenous Once  . [DISCONTINUED] iohexol  25 mL Oral Q1 Hr x 2  . [DISCONTINUED]  morphine injection  4 mg Intravenous Once    Assessment: 77 yo male with h/o sickle cell anemia admitted with abdominal pain. Pharmacy to manage heparin and Coumadin for h/o pulmonary embolism. Per med history, home Coumadin regimen of 10mg  daily. INR on admission of 1.03. Baseline Hgb 8 and platelet 246.   Goal of Therapy:  Heparin level 0.3-0.7 units/ml INR 2-3 Monitor platelets by anticoagulation protocol: Yes   Plan:  1. Heparin 2500 unit IV bolus x 1, then 850 units/hr.  2. Heparin level in 8 hours 3. Coumadin 10mg  po  today.  4. Daily PT / INR 5. Daily CBC, heparin level  6. Coumadin education with pharmacist  Emeline Gins 03/01/2013,5:56 AM  Addendum: Pharmacy consulted to manage cipro for possible UTI / pyelonephritis. Cipro 400mg  IV Q12H.   Lorre Munroe, PharmD 03/01/13, 06:50

## 2013-03-01 NOTE — Clinical Documentation Improvement (Signed)
RESPIRATORY FAILURE DOCUMENTATION CLARIFICATION QUERY   THIS DOCUMENT IS NOT A PERMANENT PART OF THE MEDICAL RECORD  TO RESPOND TO THE THIS QUERY, FOLLOW THE INSTRUCTIONS BELOW:  1. If needed, update documentation for the patient's encounter via the notes activity.  2. Access this query again and click edit on the In [employer].  3. After updating, or not, click F2 to complete all highlighted (required) fields concerning your review. Select "additional documentation in the medical record" OR "no additional documentation provided".  4. Click Sign note button.  5. The deficiency will fall out of your In Basket *Please let us know if you are not able to complete this workflow by phone or e-mail (listed below).  Please update your documentation within the medical record to reflect your response to this query.                                                                                    03/01/13  Dear Anthony [name],  In a better effort to capture your patient's severity of illness, reflect appropriate length of stay and utilization of resources, a review of the patient medical record has revealed the following indicators.    Based on your clinical judgment, please clarify and document in a progress note and/or discharge summary the clinical condition associated with the following supporting information:  In responding to this query please exercise your independent judgment.  The fact that a query is asked, does not imply that any particular answer is desired or expected.  Please clarify respiratory status. Thank you  Possible Clinical Conditions?  Acute Respiratory Failure Acute on Chronic Respiratory Failure Chronic Respiratory Failure Other Condition________________ Cannot Clinically Determine   COPD COPD acute exacerbation    Supporting Information:  Risk Factors: History of COPD ED Provider note: emphysema on 3 l oxygen at home; increased difficulty  breathing  Signs&Symptoms: He does have some wheezing and shortness of breath Physical assessment: Pulmonary/Chest: Bilateral wheezing in the lungs.  Patient with wheezing. He may have pulmonary embolus versus COPD exacerbation and or pneumonia Dyspneic speaking in 2-3 word sentences. Complains of difficulty breathing  Diagnostics: ABG-Not noted on chart      Radiology: 5/1 [id number]:  Stable chronic emphysematous changes and fibrosis in the lungs with  chronic blunting of the right costophrenic angle suggesting  thickened pleura. No evidence of active pulmonary disease   Treatment: Proventil 2.5mg  neb every 4 hours and every 2 hours prn Proventil 5mg  neb x 1 on 4/30 Atrovent 0.5mg  neb every 4 hours Atrovent 0.5mg  neb x 1 4/30 Pulse ox w/vital signs O2 @2 -3L/min  You may use possible, probable, or suspect with inpatient documentation. possible, probable, suspected diagnoses MUST be documented at the time of discharge  Reviewed: additional documentation in the medical record  Thank You,  Debora T Williams  RN, MSN Clinical Documentation Specialist: Office# [phone] [facility] Health Information Management [place]

## 2013-03-01 NOTE — H&P (Signed)
[facility] History and Physical  Anthony [name] ZOX:[id number] DOB: Oct 21, 1936 DOA: 02/28/2013  Referring physician: ER physician. PCP: Dorrene German, MD  Specialists: None.  Chief Complaint: Abdominal pain.  HPI: Anthony [name] is a 77 y.o. male with history of COPD, pulmonary embolism, sickle cell anemia presented to the ER because of abdominal pain. Patient has been having abdominal pain for last 3-4 days. The pain is mostly on the periumbilical area constant. There was no associated nausea vomiting or diarrhea. Denies any fever chills. In the ER patient also was found to be mildly short of breath with wheezing and was placed on nebulizer. Patient's labs show increased lipase concerning for pancreatitis. Chest x-ray does not show the acute and CT abdomen and pelvis is pending. Patient has been admitted for further management.  Review of Systems: As presented in the history of presenting illness, rest negative.  Past Medical History  Diagnosis Date  . COPD (chronic obstructive pulmonary disease)   . Sickle cell anemia   . Pulmonary embolism    History reviewed. No pertinent past surgical history. Social History:  reports that he has been smoking.  He does not have any smokeless tobacco history on file. He reports that he does not drink alcohol. His drug history is not on file. Lives at home. where does patient live-- Can do ADLs. Can patient participate in ADLs?  No Known Allergies  Family History  Problem Relation Age of Onset  . Sickle cell anemia Mother      Prior to Admission medications   Medication Sig Start Date End Date Taking? Authorizing Provider  albuterol (PROVENTIL) (2.5 MG/3ML) 0.083% nebulizer solution Take 2.5 mg by nebulization every 6 (six) hours as needed (wheezing).   Yes Historical Provider, MD  albuterol-ipratropium (COMBIVENT) 18-103 MCG/ACT inhaler Inhale 2 puffs into the lungs 4 (four) times daily.   Yes Historical Provider, MD  ENSURE  (ENSURE) Take 237 mLs by mouth 3 (three) times daily between meals.   Yes Historical Provider, MD  fluticasone (FLONASE) 50 MCG/ACT nasal spray Place 2 sprays into the nose daily. 12/30/12  Yes Leroy Sea, MD  HYDROcodone-acetaminophen (VICODIN) 5-500 MG per tablet Take 1 tablet by mouth every 6 (six) hours as needed for pain.   Yes Historical Provider, MD  tiotropium (SPIRIVA) 18 MCG inhalation capsule Place 18 mcg into inhaler and inhale daily.   Yes Historical Provider, MD  Vitamin D, Ergocalciferol, (DRISDOL) 50000 UNITS CAPS Take 50,000 Units by mouth every 7 (seven) days. On mondays   Yes Historical Provider, MD  warfarin (COUMADIN) 5 MG tablet Take 2 tablets (10 mg total) by mouth every evening. Get INR check 2/week by your Primary MD for 2 weeks. 12/30/12  Yes Leroy Sea, MD   Physical Exam: Filed Vitals:   03/01/13 0130 03/01/13 0145 03/01/13 0200 03/01/13 0247  BP: 166/83 173/79 159/80 162/87  Pulse: 86 91 82 81  Temp:    98.4 F (36.9 C)  TempSrc:    Oral  Resp:  22 14 16   Height:    5\' 10"  (1.778 m)  Weight:    49 kg (108 lb 0.4 oz)  SpO2: 100% 100% 100% 100%     General:  Well developed and moderately nourished.  Eyes: Anicteric no pallor.  ENT: No discharge from the ears eyes nose and mouth.  Neck: No mass felt.  Cardiovascular: S1-S2 heard.  Respiratory: No rhonchi or crepitations.  Abdomen: Soft mild tenderness diffusely. Mild rigidity. Bowel sounds present.  Skin: No rash.  Musculoskeletal: No edema.  Psychiatric: Appears normal.  Neurologic: Alert awake oriented to time place and person. Moves all extremities.  Labs on Admission:  Basic Metabolic Panel:  Recent Labs Lab 02/28/13 2142  NA 133*  K 4.2  CL 95*  CO2 30  GLUCOSE 97  BUN 11  CREATININE 1.05  CALCIUM 8.8   Liver Function Tests:  Recent Labs Lab 02/28/13 2142  AST 25  ALT 9  ALKPHOS 53  BILITOT 0.4  PROT 7.5  ALBUMIN 3.6    Recent Labs Lab 02/28/13 2142   LIPASE 157*   No results found for this basename: AMMONIA,  in the last 168 hours CBC:  Recent Labs Lab 02/28/13 2142  WBC 5.8  NEUTROABS 3.7  HGB 8.0*  HCT 24.0*  MCV 57.6*  PLT 246   Cardiac Enzymes: No results found for this basename: CKTOTAL, CKMB, CKMBINDEX, TROPONINI,  in the last 168 hours  BNP (last 3 results) No results found for this basename: PROBNP,  in the last 8760 hours CBG: No results found for this basename: GLUCAP,  in the last 168 hours  Radiological Exams on Admission: Dg Chest 2 View  03/01/2013  *RADIOLOGY REPORT*  Clinical Data: Shortness of breath.  COPD.  Emphysema.  Sickle cell.  CHEST - 2 VIEW  Comparison: 12/28/2012  Findings: Emphysematous changes in the lungs with scattered fibrosis.  Chronic blunting of the right costophrenic angle suggesting thickened pleura.  This is stable since previous study. Heart size and pulmonary vascularity are normal.  No focal airspace consolidation in the lungs.  No pneumothorax.  Mediastinal contours appear intact.  Calcification of the aorta.  Degenerative changes in the thoracic spine.  IMPRESSION: Stable chronic emphysematous changes and fibrosis in the lungs with chronic blunting of the right costophrenic angle suggesting thickened pleura.  No evidence of active pulmonary disease.   Original Report Authenticated By: Burman Nieves, M.D.      Assessment/Plan Principal Problem:   Abdominal pain Active Problems:   SICKLE CELL ANEMIA   COPD   PULMONARY EMBOLISM, HX OF   1. Abdominal pain most likely secondary to pancreatitis - at this time I have placed patient n.p.o. Check CT abdomen and pelvis. Continue with pain relief medications. Gentle hydration. We'll also check EKG and cardiac markers. Patient states he drinks only alcohol once a week and last drink was more than a week ago. His LFTs are normal at this time. Check triglyceride levels to find the cause.  2. COPD - on exam he was not wheezing. Continue with  nebulizers. 3. History of PE on Coumadin - subtherapeutic INR. If CT abdomen does not show any condition which requires surgery then we will start heparin until patient can take Coumadin orally. 4. Sickle cell anemia - follow CBC closely. 5. Tobacco abuse - advised patient to quit smoking.    Code Status: Full code.  Family Communication: None.  Disposition Plan: Admit to inpatient.    Ansleigh Safer N. [facility] Pager [phone].  If 7PM-7AM, please contact night-coverage [email or web address] Password [facility] 03/01/2013, 4:28 AM

## 2013-03-01 NOTE — Progress Notes (Addendum)
Patient seen and examined by me.  Abd pain better- continue heparin/coumadin-  Start diet and advance as CT scan ok PSA ordered severe malnutrition in the context of chronic illness Chronic resp failure- on 3L O2 at home [employer]

## 2013-03-01 NOTE — Progress Notes (Signed)
INITIAL NUTRITION ASSESSMENT  DOCUMENTATION CODES Per approved criteria  -Severe malnutrition in the context of chronic illness -Underweight   INTERVENTION:  Advance diet as medically appropriate RD to follow for nutrition care plan, add interventions accordingly  NUTRITION DIAGNOSIS: Increased nutrient needs related to COPD, malnutrition as evidenced by estimated nutrition needs  Goal: Oral intake with meals & supplements to meet >/= 90% of estimated nutrition needs  Monitor:  PO & supplemental intake, weight, labs, I/O's  Reason for Assessment: Malnutrition Screening Tool Report  77 y.o. male  Admitting Dx: Abdominal pain  ASSESSMENT: Patient with history of COPD, pulmonary embolism, sickle cell anemia presented because of abdominal pain; in ER  was found to be mildly short of breath with wheezing and was placed on nebulizer; labs show increased lipase concerning for pancreatitis ---> admitted for further management.  Patient seen per Nutritional Management at [facility] 2/28; reports to this RD his appetite has been ok, however, he had trouble physically getting into the kitchen and preparing his meals; states he only consumes 1 meal per day; also reports ongoing weight loss for the last 3-4 years (70 lb total weight loss); would like Ensure supplements when able.  Nutrition Focused Physical Exam:   Subcutaneous Fat:  Orbital Region: severe wasting  Upper Arm Region: severe wasting  Thoracic and Lumbar Region: mild/moderate wasting   Muscle:  Temple Region: severe wasting  Clavicle Bone Region: severe wasting  Clavicle and Acromion Bone Region: severe wasting  Scapular Bone Region: mild/moderate wasting  Dorsal Hand: severe wasting  Patellar Region: severe wasting  Anterior Thigh Region: severe wasting  Posterior Calf Region: severe wasting  Patient meets criteria for severe malnutrition in the context of chronic illness given < 75% intake of estimated energy requirement  for > 1 month and severe subcutaneous fat & muscle loss.  Height: Ht Readings from Last 1 Encounters:  03/01/13 5\' 10"  (1.778 m)    Weight: Wt Readings from Last 1 Encounters:  03/01/13 108 lb 0.4 oz (49 kg)    Ideal Body Weight: 166 lb  % Ideal Body Weight: 65%  Wt Readings from Last 10 Encounters:  03/01/13 108 lb 0.4 oz (49 kg)  12/29/12 101 lb 13.6 oz (46.2 kg)  04/10/09 136 lb 8 oz (61.916 kg)    Usual Body Weight: 136 lb  % Usual Body Weight: 79%  BMI:  Body mass index is 15.5 kg/(m^2).  Estimated Nutritional Needs: Kcal: 1700-1900 Protein: 80-90 gm Fluid: 1.7-1.8 L  Skin: Intact  Diet Order: Clear Liquid  EDUCATION NEEDS: -No education needs identified at this time   Intake/Output Summary (Last 24 hours) at 03/01/13 1209 Last data filed at 03/01/13 0900  Gross per 24 hour  Intake      0 ml  Output    850 ml  Net   -850 ml    Last BM: 4/29  Labs:   Recent Labs Lab 02/28/13 2142 03/01/13 0500  NA 133* 133*  K 4.2 4.6  CL 95* 95*  CO2 30 32  BUN 11 13  CREATININE 1.05 1.00  CALCIUM 8.8 8.9  GLUCOSE 97 90    CBG (last 3)   Recent Labs  03/01/13 0605 03/01/13 1148  GLUCAP 69* 101*    Scheduled Meds: . albuterol  2.5 mg Nebulization Q4H  . ciprofloxacin  400 mg Intravenous Q12H  . fluticasone  2 spray Each Nare Daily  . ipratropium  0.5 mg Nebulization Q4H  . pantoprazole (PROTONIX) IV  40 mg  Intravenous Q24H  . sodium chloride  3 mL Intravenous Q12H  . tamsulosin  0.4 mg Oral QPC supper  . warfarin  10 mg Oral ONCE-1800  . Warfarin - Pharmacist Dosing Inpatient   Does not apply q1800    Continuous Infusions: . sodium chloride 100 mL/hr (03/01/13 0748)  . heparin 850 Units/hr (03/01/13 1610)    Past Medical History  Diagnosis Date  . COPD (chronic obstructive pulmonary disease)   . Sickle cell anemia   . Pulmonary embolism     History reviewed. No pertinent past surgical history.  Maureen Chatters, RD, LDN Pager  #: [phone] After-Hours Pager #: [phone]

## 2013-03-01 NOTE — Progress Notes (Signed)
ANTICOAGULATION CONSULT NOTE  Pharmacy Consult for heparin, Coumadin  Indication: H/o pulmonary embolism   No Known Allergies  Labs:  Recent Labs  02/28/13 2142 02/28/13 2241 03/01/13 0500 03/01/13 1304  HGB 8.0*  --  8.8*  --   HCT 24.0*  --  26.7*  --   PLT 246  --  229  --   LABPROT  --  13.4 15.0  --   INR  --  1.03 1.20  --   HEPARINUNFRC  --   --   --  0.49  CREATININE 1.05  --  1.00  --   TROPONINI  --   --  <0.30  --     Estimated Creatinine Clearance: 44.2 ml/min (by C-G formula based on Cr of 1).   Medical History: Past Medical History  Diagnosis Date  . COPD (chronic obstructive pulmonary disease)   . Sickle cell anemia   . Pulmonary embolism     Medications:  Scheduled:  . albuterol  2.5 mg Nebulization Q4H  . [COMPLETED] albuterol  5 mg Nebulization Once  . ciprofloxacin  400 mg Intravenous Q12H  . fluticasone  2 spray Each Nare Daily  . [COMPLETED] heparin  2,500 Units Intravenous Once  . [COMPLETED] ipratropium  0.5 mg Nebulization Once  . ipratropium  0.5 mg Nebulization Q4H  . [COMPLETED]  morphine injection  4 mg Intravenous Once  . [COMPLETED]  morphine injection  4 mg Intravenous Once  . pantoprazole (PROTONIX) IV  40 mg Intravenous Q24H  . sodium chloride  3 mL Intravenous Q12H  . tamsulosin  0.4 mg Oral QPC supper  . warfarin  10 mg Oral ONCE-1800  . Warfarin - Pharmacist Dosing Inpatient   Does not apply q1800  . [DISCONTINUED] sodium chloride   Intravenous Once  . [DISCONTINUED] iohexol  25 mL Oral Q1 Hr x 2  . [DISCONTINUED]  morphine injection  4 mg Intravenous Once    Assessment: 77 yo male with h/o sickle cell anemia admitted with abdominal pain. Pharmacy to manage heparin and Coumadin for h/o pulmonary embolism. Per med history, home Coumadin regimen of 10mg  daily.   Heparin level therapeutic at 0.49  Goal of Therapy:  Heparin level 0.3-0.7 units/ml INR 2-3 Monitor platelets by anticoagulation protocol: Yes   Plan:  1)  Continue heparin at 850 units / hr 2) Coumadin 10 mg po x 1 dose tonight 3) Follow up AM

## 2013-03-01 NOTE — Clinical Documentation Improvement (Signed)
MALNUTRITION DOCUMENTATION CLARIFICATION  THIS DOCUMENT IS NOT A PERMANENT PART OF THE MEDICAL RECORD  TO RESPOND TO THE THIS QUERY, FOLLOW THE INSTRUCTIONS BELOW:  1. If needed, update documentation for the patient's encounter via the notes activity.  2. Access this query again and click edit on the In [employer].  3. After updating, or not, click F2 to complete all highlighted (required) fields concerning your review. Select "additional documentation in the medical record" OR "no additional documentation provided".  4. Click Sign note button.  5. The deficiency will fall out of your In Basket *Please let us know if you are not able to complete this workflow by phone or e-mail (listed below).  Please update your documentation within the medical record to reflect your response to this query.                                                                                        03/01/13   Dear Dr. Benjamine Mola / Associates,  In a better effort to capture your patient's severity of illness, reflect appropriate length of stay and utilization of resources, a review of the patient medical record has revealed the following indicators.    Based on your clinical judgment, please clarify and document in a progress note and/or discharge summary the clinical condition associated with the following supporting information:  In responding to this query please exercise your independent judgment.  The fact that a query is asked, does not imply that any particular answer is desired or expected.  Please clarify nutritional status. Thank you.  Possible Clinical Conditions?  Mild Malnutrition  Moderate Malnutrition Severe Malnutrition   Protein Calorie Malnutrition Severe Protein Calorie Malnutrition Cachexia   Other Condition________________ Cannot clinically determine   Supporting Information: Risk Factors: Per RD assessment: Patient meets criteria for severe malnutrition in the context of chronic  illness given < 75% intake of estimated energy requirement for > 1 month and severe subcutaneous fat & muscle loss. history of COPD, pulmonary embolism, sickle cell anemia Admitted w/abdominal pain Admitted w/Pancreatitis Cachectic appearance Muscle wasting  Signs & Symptoms: Ht 5'10" Wt 108 lbs  BMI: 15.5 Weight  Loss 70 lbs over last 3-4 years  Treatment  Clear liquid diet/Advance diet as tolerated Daily weights I&O qshift  Nutrition Consult NUTRITION DIAGNOSIS:  Increased nutrient needs related to COPD, malnutrition as evidenced by estimated nutrition needs  Goal:  Oral intake with meals & supplements to meet >/= 90% of estimated nutrition needs  Monitor:  PO & supplemental intake, weight, labs, I/O's Patient seen per Nutritional Management at [facility] 2/28; reports to this RD his appetite has been ok, however, he had trouble physically getting into the kitchen and preparing his meals; states he only consumes 1 meal per day; also reports ongoing weight loss for the last 3-4 years (70 lb total weight loss); would like Ensure supplements when able. INTERVENTION:  Advance diet as medically appropriate RD to follow for nutrition care plan, add interventions accordingly   You may use possible, probable, or suspect with inpatient documentation. possible, probable, suspected diagnoses MUST be documented at the time of discharge  Reviewed: additional documentation in  the medical record  Thank You,  Debora T Williams RN, MSN Clinical Documentation Specialist: Office# [phone] [facility] Health Information Management [place]

## 2013-03-02 DIAGNOSIS — D571 Sickle-cell disease without crisis: Secondary | ICD-10-CM

## 2013-03-02 LAB — CBC
HCT: 22.7 % — ABNORMAL LOW (ref 39.0–52.0)
Hemoglobin: 7.5 g/dL — ABNORMAL LOW (ref 13.0–17.0)
MCH: 19.3 pg — ABNORMAL LOW (ref 26.0–34.0)
MCHC: 33 g/dL (ref 30.0–36.0)
RBC: 3.89 MIL/uL — ABNORMAL LOW (ref 4.22–5.81)

## 2013-03-02 LAB — GLUCOSE, CAPILLARY: Glucose-Capillary: 83 mg/dL (ref 70–99)

## 2013-03-02 LAB — PSA: PSA: 0.71 ng/mL (ref <=4.00)

## 2013-03-02 LAB — HEPARIN LEVEL (UNFRACTIONATED): Heparin Unfractionated: 0.61 IU/mL (ref 0.30–0.70)

## 2013-03-02 LAB — IRON AND TIBC
Iron: 33 ug/dL — ABNORMAL LOW (ref 42–135)
Saturation Ratios: 10 % — ABNORMAL LOW (ref 20–55)
TIBC: 336 ug/dL (ref 215–435)

## 2013-03-02 LAB — PROTIME-INR
INR: 1.17 (ref 0.00–1.49)
Prothrombin Time: 14.7 seconds (ref 11.6–15.2)

## 2013-03-02 MED ORDER — WARFARIN SODIUM 10 MG PO TABS
10.0000 mg | ORAL_TABLET | Freq: Once | ORAL | Status: AC
  Administered 2013-03-02: 10 mg via ORAL
  Filled 2013-03-02 (×2): qty 1

## 2013-03-02 MED ORDER — SALINE SPRAY 0.65 % NA SOLN
1.0000 | NASAL | Status: DC | PRN
  Administered 2013-03-02 – 2013-03-04 (×3): 1 via NASAL
  Filled 2013-03-02: qty 44

## 2013-03-02 MED ORDER — PANTOPRAZOLE SODIUM 40 MG PO TBEC
40.0000 mg | DELAYED_RELEASE_TABLET | Freq: Every day | ORAL | Status: DC
  Administered 2013-03-02 – 2013-03-09 (×8): 40 mg via ORAL
  Filled 2013-03-02 (×8): qty 1

## 2013-03-02 MED ORDER — POLYETHYLENE GLYCOL 3350 17 G PO PACK
17.0000 g | PACK | Freq: Every day | ORAL | Status: DC | PRN
  Filled 2013-03-02: qty 1

## 2013-03-02 NOTE — Progress Notes (Signed)
[facility] PROGRESS NOTE  Anthony [name] WUJ:[id number] DOB: November 03, 1935 DOA: 02/28/2013 PCP: Dorrene German, MD  Assessment/Plan: Abdominal pain most likely secondary to pancreatitis vis sickle call anemia - clears and advance diet as tolerated. Continue with pain relief medications. Patient states he drinks only alcohol once a week and last drink was more than a week ago. His LFTs are normal at this time. triglyceride levels are ok  COPD - on exam he was not wheezing. Continue with nebulizers.   Anemia- no sign of GI bleeding- ?sickle cell crisis- check Fe panel; occult blood- trend  History of PE on Coumadin - coumadin  Sickle cell anemia - follow CBC closely.   Tobacco abuse - advised patient to quit smoking   Code Status: full Family Communication: patient Disposition Plan: home   Consultants:  none  Procedures:    Antibiotics:    HPI/Subjective: Asking for more food Minimal belly pain  Objective: Filed Vitals:   03/01/13 2050 03/01/13 2358 03/02/13 0342 03/02/13 0605  BP:    130/58  Pulse:    90  Temp:    97.8 F (36.6 C)  TempSrc:    Oral  Resp:    18  Height:      Weight:    51.1 kg (112 lb 10.5 oz)  SpO2: 98% 98% 99% 100%    Intake/Output Summary (Last 24 hours) at 03/02/13 0911 Last data filed at 03/02/13 0447  Gross per 24 hour  Intake    720 ml  Output   1850 ml  Net  -1130 ml   Filed Weights   03/01/13 0247 03/02/13 0605  Weight: 49 kg (108 lb 0.4 oz) 51.1 kg (112 lb 10.5 oz)    Exam:  General: Well developed and moderately nourished.   Cardiovascular: S1-S2 heard.  Respiratory: No rhonchi or crepitations, no wheezing Abdomen: dec BS, soft  Skin: No rash.   Data Reviewed: Basic Metabolic Panel:  Recent Labs Lab 02/28/13 2142 03/01/13 0500  NA 133* 133*  K 4.2 4.6  CL 95* 95*  CO2 30 32  GLUCOSE 97 90  BUN 11 13  CREATININE 1.05 1.00  CALCIUM 8.8 8.9   Liver Function Tests:  Recent Labs Lab  02/28/13 2142 03/01/13 0500  AST 25 25  ALT 9 8  ALKPHOS 53 54  BILITOT 0.4 0.4  PROT 7.5 7.6  ALBUMIN 3.6 3.6    Recent Labs Lab 02/28/13 2142 03/01/13 0500  LIPASE 157* 86*   No results found for this basename: AMMONIA,  in the last 168 hours CBC:  Recent Labs Lab 02/28/13 2142 03/01/13 0500 03/02/13 0540  WBC 5.8 6.6 5.0  NEUTROABS 3.7 3.7  --   HGB 8.0* 8.8* 7.5*  HCT 24.0* 26.7* 22.7*  MCV 57.6* 58.4* 58.4*  PLT 246 229 202   Cardiac Enzymes:  Recent Labs Lab 03/01/13 0500  TROPONINI <0.30   BNP (last 3 results) No results found for this basename: PROBNP,  in the last 8760 hours CBG:  Recent Labs Lab 03/01/13 0605 03/01/13 1148 03/01/13 1759 03/01/13 2104 03/02/13 0611  GLUCAP 69* 101* 109* 117* 83    No results found for this or any previous visit (from the past 240 hour(s)).   Studies: Dg Chest 2 View  03/01/2013  *RADIOLOGY REPORT*  Clinical Data: Shortness of breath.  COPD.  Emphysema.  Sickle cell.  CHEST - 2 VIEW  Comparison: 12/28/2012  Findings: Emphysematous changes in the lungs with scattered fibrosis.  Chronic blunting of  the right costophrenic angle suggesting thickened pleura.  This is stable since previous study. Heart size and pulmonary vascularity are normal.  No focal airspace consolidation in the lungs.  No pneumothorax.  Mediastinal contours appear intact.  Calcification of the aorta.  Degenerative changes in the thoracic spine.  IMPRESSION: Stable chronic emphysematous changes and fibrosis in the lungs with chronic blunting of the right costophrenic angle suggesting thickened pleura.  No evidence of active pulmonary disease.   Original Report Authenticated By: Burman Nieves, M.D.    Ct Abdomen Pelvis W Contrast  03/01/2013  *RADIOLOGY REPORT*  Clinical Data: Abdominal pain for 4 days.  Headache and hypertension.  Lower abdominal pain.  History of sickle cell.  CT ABDOMEN AND PELVIS WITH CONTRAST  Technique:  Multidetector CT imaging  of the abdomen and pelvis was performed following the standard protocol during bolus administration of intravenous contrast.  Contrast: OMNIPAQUE IOHEXOL 300 MG/ML  SOLN  Comparison: 08/28/2003  Findings: Emphysematous changes in the lung bases.  Fibrosis or atelectasis in the lung bases.  Focal calcifications in the liver and spleen.  Tiny sub-centimeter low attenuation changes in the liver most likely represent small cysts.  The gallbladder, pancreas, adrenal glands, inferior vena cava, and retroperitoneal lymph nodes are unremarkable. Calcification of the aorta without aneurysm.  Multi focal scarring in the kidneys.  No hydronephrosis.  Nephrograms are somewhat heterogeneous.  This could be due to small vessel disease or infection.  Consider pyelonephritis.  The stomach, small bowel, and colon are unremarkable.  No bowel wall thickening or distension. No free air or free fluid in the abdomen.  Pelvis:  Prostate gland is somewhat enlarged.  Bladder wall is not thickened but the bladder is mildly distended.  The appendix is normal.  No evidence of diverticulitis.  No significant pelvic lymphadenopathy.  No free or loculated pelvic fluid collections. Heterogeneous mineralization of the bones with areas of sclerosis and lucency and multiple vertebrae, in the pelvis, and in the hips. This is likely to represent sickle cell change although metastatic disease is not excluded.  Correlation with PSA is recommended.  IMPRESSION: Prostatic enlargement.  Bladder distension without wall thickening. Multi focal scarring and heterogeneous nephrograms in the kidneys. Changes may represent small vessel disease, reflux, or infection. Heterogeneous sclerosis and lucency in the bones may represent sickle cell changes with bone infarcts.  Bone metastasis not excluded.  Correlation with PSA is recommended.  Atelectasis in the lung bases.   Original Report Authenticated By: Burman Nieves, M.D.     Scheduled Meds: .  albuterol  2.5 mg Nebulization Q4H  . ciprofloxacin  400 mg Intravenous Q12H  . fluticasone  2 spray Each Nare Daily  . ipratropium  0.5 mg Nebulization Q4H  . pantoprazole (PROTONIX) IV  40 mg Intravenous Q24H  . sodium chloride  3 mL Intravenous Q12H  . tamsulosin  0.4 mg Oral QPC supper  . Warfarin - Pharmacist Dosing Inpatient   Does not apply q1800   Continuous Infusions: . heparin 850 Units/hr (03/01/13 0643)    Principal Problem:   Abdominal pain Active Problems:   SICKLE CELL ANEMIA   COPD   PULMONARY EMBOLISM, HX OF    Time spent: 35    [facility], Deryn Massengale  [facility] Pager [phone] If 7PM-7AM, please contact night-coverage at [email or web address], password [facility] 03/02/2013, 9:11 AM  LOS: 2 days

## 2013-03-02 NOTE — Progress Notes (Signed)
ANTICOAGULATION CONSULT NOTE - Follow Up Consult  Pharmacy Consult:  Heparin / Coumadin Indication:  History of PE  No Known Allergies  Patient Measurements: Height: 5\' 10"  (177.8 cm) Weight: 112 lb 10.5 oz (51.1 kg) IBW/kg (Calculated) : 73 Heparin Dosing Weight: 51 kg  Vital Signs: Temp: 97.8 F (36.6 C) (05/02 0605) Temp src: Oral (05/02 0605) BP: 130/58 mmHg (05/02 0605) Pulse Rate: 90 (05/02 0605)  Labs:  Recent Labs  02/28/13 2142 02/28/13 2241 03/01/13 0500 03/01/13 1304 03/02/13 0540  HGB 8.0*  --  8.8*  --  7.5*  HCT 24.0*  --  26.7*  --  22.7*  PLT 246  --  229  --  202  LABPROT  --  13.4 15.0  --  14.7  INR  --  1.03 1.20  --  1.17  HEPARINUNFRC  --   --   --  0.49 0.61  CREATININE 1.05  --  1.00  --   --   TROPONINI  --   --  <0.30  --   --     Estimated Creatinine Clearance: 46.1 ml/min (by C-G formula based on Cr of 1).       Assessment: Anthony [name] with history of sickle cell anemia admitted with abdominal pain.  Pharmacy to manage heparin and Coumadin for history of pulmonary embolism.  Heparin level at goal; INR remains sub-therapeutic as expected; no bleeding reported.  Noted he continues on Cipro, which could increase the effect of Coumadin.   Goal of Therapy:  Heparin level 0.3-0.7 units/ml INR 2 - 3 Monitor platelets by anticoagulation protocol: Yes    Plan:  - Repeat Coumadin 10mg  PO today - Decrease heparin gtt slightly to 800 units/hr - Daily PT / INR / HL / CBC - F/U abx LOT    Dalesha Stanback D. Laney Potash, PharmD, BCPS Pager:  [phone] 03/02/2013, 10:20 AM

## 2013-03-03 LAB — GLUCOSE, CAPILLARY
Glucose-Capillary: 116 mg/dL — ABNORMAL HIGH (ref 70–99)
Glucose-Capillary: 101 mg/dL — ABNORMAL HIGH (ref 70–99)
Glucose-Capillary: 93 mg/dL (ref 70–99)

## 2013-03-03 LAB — BASIC METABOLIC PANEL
BUN: 11 mg/dL (ref 6–23)
Chloride: 97 mEq/L (ref 96–112)
Creatinine, Ser: 1.01 mg/dL (ref 0.50–1.35)
GFR calc Af Amer: 82 mL/min — ABNORMAL LOW (ref >90)
Glucose, Bld: 92 mg/dL (ref 70–99)

## 2013-03-03 LAB — CBC
Hemoglobin: 6.7 g/dL — CL (ref 13.0–17.0)
MCH: 19.5 pg — ABNORMAL LOW (ref 26.0–34.0)
MCHC: 34.2 g/dL (ref 30.0–36.0)

## 2013-03-03 LAB — PROTIME-INR: Prothrombin Time: 15.4 seconds — ABNORMAL HIGH (ref 11.6–15.2)

## 2013-03-03 LAB — PREPARE RBC (CROSSMATCH)

## 2013-03-03 MED ORDER — BISACODYL 10 MG RE SUPP: 10.0000 mg | Freq: Every day | RECTAL | Status: DC | PRN

## 2013-03-03 MED ORDER — SENNOSIDES-DOCUSATE SODIUM 8.6-50 MG PO TABS
1.0000 | ORAL_TABLET | Freq: Two times a day (BID) | ORAL | Status: DC
  Administered 2013-03-03 – 2013-03-08 (×8): 1 via ORAL
  Filled 2013-03-03 (×14): qty 1

## 2013-03-03 MED ORDER — WARFARIN SODIUM 10 MG PO TABS
10.0000 mg | ORAL_TABLET | Freq: Once | ORAL | Status: AC
  Administered 2013-03-03: 10 mg via ORAL
  Filled 2013-03-03: qty 1

## 2013-03-03 NOTE — Progress Notes (Signed)
[facility] PROGRESS NOTE  RICKIE GANGE AVW:[id number] DOB: 1936/06/13 DOA: 02/28/2013 PCP: Dorrene German, MD  Assessment/Plan: Abdominal pain most likely secondary to pancreatitis vis sickle call anemia - clears and advance diet as tolerated. Continue with pain relief medications. Patient states he drinks only alcohol once a week and last drink was more than a week ago. His LFTs are normal at this time. triglyceride levels are ok  COPD - on exam he was not wheezing. Continue with nebulizers.   Anemia- no sign of GI bleeding- ?sickle cell crisis- check Fe panel; occult blood pending- trend- transfuse 2 units  History of PE on Coumadin - coumadin  Sickle cell anemia - follow CBC closely.   Tobacco abuse - advised patient to quit smoking   Code Status: full Family Communication: patient Disposition Plan: home   Consultants:  none  Procedures:    Antibiotics:    HPI/Subjective: Asking for more food Minimal belly pain  Objective: Filed Vitals:   03/03/13 0923 03/03/13 0945 03/03/13 1008 03/03/13 1045  BP: 130/55 131/63  133/50  Pulse: 89 90  95  Temp: 98.2 F (36.8 C) 97.8 F (36.6 C)  98.3 F (36.8 C)  TempSrc: Oral Oral  Oral  Resp: 20 20  20   Height:      Weight:      SpO2:   100%     Intake/Output Summary (Last 24 hours) at 03/03/13 1102 Last data filed at 03/03/13 0930  Gross per 24 hour  Intake  862.5 ml  Output   2275 ml  Net -1412.5 ml   Filed Weights   03/01/13 0247 03/02/13 0605 03/03/13 0425  Weight: 49 kg (108 lb 0.4 oz) 51.1 kg (112 lb 10.5 oz) 50.939 kg (112 lb 4.8 oz)    Exam:  General: Well developed and moderately nourished.   Cardiovascular: S1-S2 heard.  Respiratory: No rhonchi or crepitations, no wheezing Abdomen: dec BS, soft  Skin: No rash.   Data Reviewed: Basic Metabolic Panel:  Recent Labs Lab 02/28/13 2142 03/01/13 0500 03/03/13 0455  NA 133* 133* 133*  K 4.2 4.6 3.9  CL 95* 95* 97  CO2 30 32 32   GLUCOSE 97 90 92  BUN 11 13 11   CREATININE 1.05 1.00 1.01  CALCIUM 8.8 8.9 8.6   Liver Function Tests:  Recent Labs Lab 02/28/13 2142 03/01/13 0500  AST 25 25  ALT 9 8  ALKPHOS 53 54  BILITOT 0.4 0.4  PROT 7.5 7.6  ALBUMIN 3.6 3.6    Recent Labs Lab 02/28/13 2142 03/01/13 0500  LIPASE 157* 86*   No results found for this basename: AMMONIA,  in the last 168 hours CBC:  Recent Labs Lab 02/28/13 2142 03/01/13 0500 03/02/13 0540 03/03/13 0455  WBC 5.8 6.6 5.0 4.3  NEUTROABS 3.7 3.7  --   --   HGB 8.0* 8.8* 7.5* 6.7*  HCT 24.0* 26.7* 22.7* 19.6*  MCV 57.6* 58.4* 58.4* 57.0*  PLT 246 229 202 174   Cardiac Enzymes:  Recent Labs Lab 03/01/13 0500  TROPONINI <0.30   BNP (last 3 results) No results found for this basename: PROBNP,  in the last 8760 hours CBG:  Recent Labs Lab 03/02/13 0611 03/02/13 1137 03/02/13 1621 03/02/13 2047 03/03/13 0602  GLUCAP 83 103* 113* 122* 116*    No results found for this or any previous visit (from the past 240 hour(s)).   Studies: No results found.  Scheduled Meds: . albuterol  2.5 mg Nebulization Q4H  .  ciprofloxacin  400 mg Intravenous Q12H  . fluticasone  2 spray Each Nare Daily  . ipratropium  0.5 mg Nebulization Q4H  . pantoprazole  40 mg Oral Q1200  . senna-docusate  1 tablet Oral BID  . sodium chloride  3 mL Intravenous Q12H  . tamsulosin  0.4 mg Oral QPC supper  . Warfarin - Pharmacist Dosing Inpatient   Does not apply q1800   Continuous Infusions: . heparin 800 Units/hr (03/02/13 1311)    Principal Problem:   Abdominal pain Active Problems:   SICKLE CELL ANEMIA   COPD   PULMONARY EMBOLISM, HX OF    Time spent: 35    [facility], JESSICA  [facility] Pager [phone] If 7PM-7AM, please contact night-coverage at [email or web address], password [facility] 03/03/2013, 11:02 AM  LOS: 3 days

## 2013-03-03 NOTE — ED Provider Notes (Signed)
Medical screening examination/treatment/procedure(s) were conducted as a shared visit with non-physician practitioner(s) and myself.  I personally evaluated the patient during the encounter.  Pt with new abd pain, found to have elevated lipase.  Also with COPD exacerbation, improved after one neb.  Plan for admission given co morbidities and age.  Olivia Mackie, MD 03/03/13 [phone]

## 2013-03-03 NOTE — Progress Notes (Signed)
ANTICOAGULATION CONSULT NOTE - Follow Up Consult  Pharmacy Consult:  Heparin / Coumadin Indication:  History of PE  No Known Allergies  Patient Measurements: Height: 5\' 10"  (177.8 cm) Weight: 112 lb 4.8 oz (50.939 kg) IBW/kg (Calculated) : 73 Heparin Dosing Weight: 51 kg  Vital Signs: Temp: 98.3 F (36.8 C) (05/03 1045) Temp src: Oral (05/03 1045) BP: 133/50 mmHg (05/03 1045) Pulse Rate: 95 (05/03 1045)  Labs:  Recent Labs  02/28/13 2142  03/01/13 0500 03/01/13 1304 03/02/13 0540 03/03/13 0455  HGB 8.0*  --  8.8*  --  7.5* 6.7*  HCT 24.0*  --  26.7*  --  22.7* 19.6*  PLT 246  --  229  --  202 174  LABPROT  --   < > 15.0  --  14.7 15.4*  INR  --   < > 1.20  --  1.17 1.24  HEPARINUNFRC  --   --   --  0.49 0.61 0.42  CREATININE 1.05  --  1.00  --   --  1.01  TROPONINI  --   --  <0.30  --   --   --   < > = values in this interval not displayed.  Estimated Creatinine Clearance: 45.5 ml/min (by C-G formula based on Cr of 1.01).       Assessment: 39 YOM with history of sickle cell anemia admitted with abdominal pain.  Pharmacy to manage heparin and Coumadin for history of pulmonary embolism.  Heparin level = 0.42, at therapeutic goal on IV heparin 800 units/hrl; INR = 1.24 remains sub-therapeutic after 2 days of 10 mg but has bumped up some. Home dose reported as 10 mg daily, last taken on 4/29. None given on 4/30 then resumed on 03/01/13. No bleeding reported.  Noted he continues on Cipro, which could increase the effect of Coumadin.  Anemia- no sign of GI bleeding- ?sickle cell crisis. Dr. Benjamine Mola to check Fe panel; occult blood pending- trend- transfuse 2 units today.    Goal of Therapy:  Heparin level 0.3-0.7 units/ml INR 2 - 3 Monitor platelets by anticoagulation protocol: Yes    Plan:  - Repeat Coumadin 10mg  PO today - Continue heparin gtt slightly to 800 units/hr - Daily PT / INR / HL / CBC - F/U abx LOT  Noah Delaine, RPh Clinical Pharmacist Pager:  [phone] 03/03/2013, 11:37 AM

## 2013-03-04 LAB — TYPE AND SCREEN
Antibody Screen: NEGATIVE
Unit division: 0

## 2013-03-04 LAB — GLUCOSE, CAPILLARY
Glucose-Capillary: 100 mg/dL — ABNORMAL HIGH (ref 70–99)
Glucose-Capillary: 97 mg/dL (ref 70–99)

## 2013-03-04 LAB — CBC
HCT: 27.4 % — ABNORMAL LOW (ref 39.0–52.0)
Hemoglobin: 9.4 g/dL — ABNORMAL LOW (ref 13.0–17.0)
MCV: 62 fL — ABNORMAL LOW (ref 78.0–100.0)
RBC: 4.42 MIL/uL (ref 4.22–5.81)
WBC: 5 10*3/uL (ref 4.0–10.5)

## 2013-03-04 MED ORDER — FERROUS SULFATE 325 (65 FE) MG PO TABS
325.0000 mg | ORAL_TABLET | Freq: Two times a day (BID) | ORAL | Status: DC
  Administered 2013-03-04 – 2013-03-09 (×10): 325 mg via ORAL
  Filled 2013-03-04 (×12): qty 1

## 2013-03-04 MED ORDER — WARFARIN SODIUM 7.5 MG PO TABS
7.5000 mg | ORAL_TABLET | Freq: Once | ORAL | Status: AC
  Administered 2013-03-04: 7.5 mg via ORAL
  Filled 2013-03-04: qty 1

## 2013-03-04 MED ORDER — LACTULOSE 10 GM/15ML PO SOLN
10.0000 g | Freq: Every day | ORAL | Status: DC | PRN
  Filled 2013-03-04: qty 15

## 2013-03-04 NOTE — Progress Notes (Signed)
[facility] PROGRESS NOTE  MCCORMICK MACON ZOX:[id number] DOB: 11-26-1935 DOA: 02/28/2013 PCP: Dorrene German, MD  Assessment/Plan: Abdominal pain most likely secondary to pancreatitis vis sickle call anemia - clears and advance diet as tolerated. Continue with pain relief medications. Patient states he drinks only alcohol once a week and last drink was more than a week ago. His LFTs are normal at this time. triglyceride levels are ok  COPD - on exam he was not wheezing. Continue with nebulizers.   Anemia- no sign of GI bleeding- ?sickle cell crisis- check Fe panel; occult blood pending- trend- transfused 2 units- patient feeling better  History of PE on Coumadin - coumadin- bridging with heparin  Sickle cell anemia - follow CBC closely.   Tobacco abuse - advised patient to quit smoking   Code Status: full Family Communication: patient Disposition Plan: home   Consultants:  none  Procedures:    Antibiotics:    HPI/Subjective: Asking for more food Minimal belly pain  Objective: Filed Vitals:   03/03/13 2304 03/04/13 0321 03/04/13 0424 03/04/13 0735  BP:   134/68   Pulse:   102   Temp:   99.4 F (37.4 C)   TempSrc:   Oral   Resp:   20   Height:      Weight:   50.621 kg (111 lb 9.6 oz)   SpO2: 100% 100% 100% 98%    Intake/Output Summary (Last 24 hours) at 03/04/13 1109 Last data filed at 03/04/13 1038  Gross per 24 hour  Intake  342.5 ml  Output   1425 ml  Net -1082.5 ml   Filed Weights   03/02/13 0605 03/03/13 0425 03/04/13 0424  Weight: 51.1 kg (112 lb 10.5 oz) 50.939 kg (112 lb 4.8 oz) 50.621 kg (111 lb 9.6 oz)    Exam:  General: Well developed and moderately nourished.   Cardiovascular: S1-S2 heard.  Respiratory: No rhonchi or crepitations, no wheezing- tight Abdomen: dec BS, soft  Skin: No rash.   Data Reviewed: Basic Metabolic Panel:  Recent Labs Lab 02/28/13 2142 03/01/13 0500 03/03/13 0455  NA 133* 133* 133*  K 4.2 4.6  3.9  CL 95* 95* 97  CO2 30 32 32  GLUCOSE 97 90 92  BUN 11 13 11   CREATININE 1.05 1.00 1.01  CALCIUM 8.8 8.9 8.6   Liver Function Tests:  Recent Labs Lab 02/28/13 2142 03/01/13 0500  AST 25 25  ALT 9 8  ALKPHOS 53 54  BILITOT 0.4 0.4  PROT 7.5 7.6  ALBUMIN 3.6 3.6    Recent Labs Lab 02/28/13 2142 03/01/13 0500  LIPASE 157* 86*   No results found for this basename: AMMONIA,  in the last 168 hours CBC:  Recent Labs Lab 02/28/13 2142 03/01/13 0500 03/02/13 0540 03/03/13 0455 03/04/13 0559  WBC 5.8 6.6 5.0 4.3 5.0  NEUTROABS 3.7 3.7  --   --   --   HGB 8.0* 8.8* 7.5* 6.7* 9.4*  HCT 24.0* 26.7* 22.7* 19.6* 27.4*  MCV 57.6* 58.4* 58.4* 57.0* 62.0*  PLT 246 229 202 174 173   Cardiac Enzymes:  Recent Labs Lab 03/01/13 0500  TROPONINI <0.30   BNP (last 3 results) No results found for this basename: PROBNP,  in the last 8760 hours CBG:  Recent Labs Lab 03/03/13 0602 03/03/13 1118 03/03/13 1614 03/03/13 2050 03/04/13 0621  GLUCAP 116* 101* 99 93 100*    No results found for this or any previous visit (from the past 240 hour(s)).  Studies: No results found.  Scheduled Meds: . albuterol  2.5 mg Nebulization Q4H  . ciprofloxacin  400 mg Intravenous Q12H  . fluticasone  2 spray Each Nare Daily  . ipratropium  0.5 mg Nebulization Q4H  . pantoprazole  40 mg Oral Q1200  . senna-docusate  1 tablet Oral BID  . sodium chloride  3 mL Intravenous Q12H  . tamsulosin  0.4 mg Oral QPC supper  . warfarin  7.5 mg Oral ONCE-1800  . Warfarin - Pharmacist Dosing Inpatient   Does not apply q1800   Continuous Infusions: . heparin 800 Units/hr (03/03/13 2255)    Principal Problem:   Abdominal pain Active Problems:   SICKLE CELL ANEMIA   COPD   PULMONARY EMBOLISM, HX OF    Time spent: 35    [facility], Vonzella Althaus  [facility] Pager [phone] If 7PM-7AM, please contact night-coverage at [email or web address], password [facility] 03/04/2013, 11:09 AM  LOS: 4 days

## 2013-03-04 NOTE — Progress Notes (Signed)
ANTICOAGULATION CONSULT NOTE - Follow Up Consult  Pharmacy Consult:  Heparin / Coumadin Indication:  History of PE  No Known Allergies  Patient Measurements: Height: 5\' 10"  (177.8 cm) Weight: 111 lb 9.6 oz (50.621 kg) IBW/kg (Calculated) : 73 Heparin Dosing Weight: 51 kg  Vital Signs: Temp: 99.4 F (37.4 C) (05/04 0424) Temp src: Oral (05/04 0424) BP: 134/68 mmHg (05/04 0424) Pulse Rate: 102 (05/04 0424)  Labs:  Recent Labs  03/02/13 0540 03/03/13 0455 03/04/13 0559  HGB 7.5* 6.7* 9.4*  HCT 22.7* 19.6* 27.4*  PLT 202 174 173  LABPROT 14.7 15.4* 17.5*  INR 1.17 1.24 1.48  HEPARINUNFRC 0.61 0.42 0.48  CREATININE  --  1.01  --     Estimated Creatinine Clearance: 45.2 ml/min (by C-G formula based on Cr of 1.01).    Assessment: 49 YOM with history of sickle cell anemia admitted with abdominal pain.  Pharmacy managing heparin and coumadin for history of pulmonary embolism.  Heparin level = 0.48, at therapeutic goal on IV heparin 800 units/hrl; INR = 1.48. INR gradually increasing toward goal of 2-3.  Home dose reported as 10 mg daily, last taken on 4/29. None given on 4/30 then resumed on 03/01/13.  Noted he continues on Cipro, which could increase the effect of Coumadin.   Anemia- no sign of GI bleeding- ?sickle cell crisis. Dr. Benjamine Mola to check Fe panel: Iron =33 low, TIBC wnl, sat ratio =10 low, ferritin low;  occult blood stool pending. Ttransfused 2 units PRBCs yesterday.  Hgb improved 9.4 today, pltc stable. No bleeding noted.   Will give lower dose of 7.5mg  today as INR is increasing (gradually),  now on cipro which can increase coumadin effect and due to anemia.   Goal of Therapy:  Heparin level 0.3-0.7 units/ml INR 2 - 3 Monitor platelets by anticoagulation protocol: Yes    Plan:  - Repeat Coumadin 7.5 mg PO today - Continue heparin gtt slightly to 800 units/hr - Daily PT / INR / HL / CBC - F/U abx LOT  Noah Delaine, RPh Clinical Pharmacist Pager:  [phone] 03/04/2013, 10:50 AM

## 2013-03-05 LAB — BASIC METABOLIC PANEL
Chloride: 98 mEq/L (ref 96–112)
Creatinine, Ser: 1.02 mg/dL (ref 0.50–1.35)
GFR calc Af Amer: 81 mL/min — ABNORMAL LOW (ref >90)
GFR calc non Af Amer: 70 mL/min — ABNORMAL LOW (ref >90)
Potassium: 4.2 mEq/L (ref 3.5–5.1)

## 2013-03-05 LAB — HEPARIN LEVEL (UNFRACTIONATED)
Heparin Unfractionated: 0.2 IU/mL — ABNORMAL LOW (ref 0.30–0.70)
Heparin Unfractionated: 0.12 IU/mL — ABNORMAL LOW (ref 0.30–0.70)

## 2013-03-05 LAB — PROTIME-INR
INR: 1.82 — ABNORMAL HIGH (ref 0.00–1.49)
Prothrombin Time: 20.4 seconds — ABNORMAL HIGH (ref 11.6–15.2)

## 2013-03-05 LAB — CBC
Hemoglobin: 8.9 g/dL — ABNORMAL LOW (ref 13.0–17.0)
MCHC: 34.2 g/dL (ref 30.0–36.0)
Platelets: 157 10*3/uL (ref 150–400)
RBC: 4.17 MIL/uL — ABNORMAL LOW (ref 4.22–5.81)

## 2013-03-05 LAB — GLUCOSE, CAPILLARY

## 2013-03-05 MED ORDER — CIPROFLOXACIN HCL 500 MG PO TABS
500.0000 mg | ORAL_TABLET | Freq: Two times a day (BID) | ORAL | Status: AC
  Administered 2013-03-05 (×2): 500 mg via ORAL
  Filled 2013-03-05 (×4): qty 1

## 2013-03-05 MED ORDER — WARFARIN SODIUM 7.5 MG PO TABS
7.5000 mg | ORAL_TABLET | Freq: Once | ORAL | Status: AC
  Administered 2013-03-05: 7.5 mg via ORAL
  Filled 2013-03-05: qty 1

## 2013-03-05 NOTE — Progress Notes (Signed)
[facility] PROGRESS NOTE  Anthony [name] [id number] DOB: 1936/05/25 DOA: 02/28/2013 PCP: Dorrene German, MD  Assessment/Plan: Abdominal pain most likely secondary to pancreatitis vis sickle call anemia - clears and advance diet as tolerated. Continue with pain relief medications. Patient states he drinks only alcohol once a week and last drink was more than a week ago. His LFTs are normal at this time. triglyceride levels are ok  COPD - on exam he was not wheezing. Continue with nebulizers.   Anemia- no sign of GI bleeding- ?sickle cell crisis- check Fe panel; occult blood pending- trend- transfused 2 units 5/3- patient feeling better  History of PE on Coumadin - coumadin- bridging with heparin  Sickle cell anemia - follow CBC closely.   Tobacco abuse - advised patient to quit smoking   Code Status: full Family Communication: patient Disposition Plan: home   Consultants:  none  Procedures:    Antibiotics:    HPI/Subjective: Asking for more food Minimal belly pain  Objective: Filed Vitals:   03/04/13 2317 03/05/13 0401 03/05/13 0559 03/05/13 1000  BP:   125/76   Pulse:   98   Temp:   98.3 F (36.8 C)   TempSrc:      Resp:   18   Height:      Weight:   52.254 kg (115 lb 3.2 oz)   SpO2: 100% 100% 100% 90%    Intake/Output Summary (Last 24 hours) at 03/05/13 1034 Last data filed at 03/05/13 1015  Gross per 24 hour  Intake    378 ml  Output   1495 ml  Net  -1117 ml   Filed Weights   03/03/13 0425 03/04/13 0424 03/05/13 0559  Weight: 50.939 kg (112 lb 4.8 oz) 50.621 kg (111 lb 9.6 oz) 52.254 kg (115 lb 3.2 oz)    Exam:  General: Well developed and moderately nourished.   Cardiovascular: S1-S2 heard.  Respiratory: No rhonchi or crepitations, no wheezing- tight Abdomen: dec BS, soft  Skin: No rash.   Data Reviewed: Basic Metabolic Panel:  Recent Labs Lab 02/28/13 2142 03/01/13 0500 03/03/13 0455 03/05/13 0454  NA 133* 133* 133*  137  K 4.2 4.6 3.9 4.2  CL 95* 95* 97 98  CO2 30 32 32 34*  GLUCOSE 97 90 92 109*  BUN 11 13 11 11   CREATININE 1.05 1.00 1.01 1.02  CALCIUM 8.8 8.9 8.6 8.8   Liver Function Tests:  Recent Labs Lab 02/28/13 2142 03/01/13 0500  AST 25 25  ALT 9 8  ALKPHOS 53 54  BILITOT 0.4 0.4  PROT 7.5 7.6  ALBUMIN 3.6 3.6    Recent Labs Lab 02/28/13 2142 03/01/13 0500  LIPASE 157* 86*   No results found for this basename: AMMONIA,  in the last 168 hours CBC:  Recent Labs Lab 02/28/13 2142 03/01/13 0500 03/02/13 0540 03/03/13 0455 03/04/13 0559 03/05/13 0454  WBC 5.8 6.6 5.0 4.3 5.0 5.1  NEUTROABS 3.7 3.7  --   --   --   --   HGB 8.0* 8.8* 7.5* 6.7* 9.4* 8.9*  HCT 24.0* 26.7* 22.7* 19.6* 27.4* 26.0*  MCV 57.6* 58.4* 58.4* 57.0* 62.0* 62.4*  PLT 246 229 202 174 173 157   Cardiac Enzymes:  Recent Labs Lab 03/01/13 0500  TROPONINI <0.30   BNP (last 3 results) No results found for this basename: PROBNP,  in the last 8760 hours CBG:  Recent Labs Lab 03/04/13 0621 03/04/13 1113 03/04/13 1606 03/04/13 2111 03/05/13 0606  GLUCAP 100* 86 140* 97 93    No results found for this or any previous visit (from the past 240 hour(s)).   Studies: No results found.  Scheduled Meds: . albuterol  2.5 mg Nebulization Q4H  . ciprofloxacin  500 mg Oral BID  . ferrous sulfate  325 mg Oral BID WC  . fluticasone  2 spray Each Nare Daily  . ipratropium  0.5 mg Nebulization Q4H  . pantoprazole  40 mg Oral Q1200  . senna-docusate  1 tablet Oral BID  . sodium chloride  3 mL Intravenous Q12H  . tamsulosin  0.4 mg Oral QPC supper  . warfarin  7.5 mg Oral ONCE-1800  . Warfarin - Pharmacist Dosing Inpatient   Does not apply q1800   Continuous Infusions: . heparin 900 Units/hr (03/05/13 0603)    Principal Problem:   Abdominal pain Active Problems:   SICKLE CELL ANEMIA   COPD   PULMONARY EMBOLISM, HX OF    Time spent: 35    [facility], Anthony [name]  [facility] Pager  [phone] If 7PM-7AM, please contact night-coverage at [email or web address], password [facility] 03/05/2013, 10:34 AM  LOS: 5 days

## 2013-03-05 NOTE — Progress Notes (Signed)
ANTICOAGULATION CONSULT NOTE  Pharmacy Consult:  Heparin / Coumadin Indication:  History of PE  No Known Allergies  Patient Measurements: Height: 5\' 10"  (177.8 cm) Weight: 111 lb 9.6 oz (50.621 kg) IBW/kg (Calculated) : 73 Heparin Dosing Weight: 51 kg  Vital Signs: Temp: 98.6 F (37 C) (05/04 1922) Temp src: Oral (05/04 1922) BP: 130/65 mmHg (05/04 1922) Pulse Rate: 91 (05/04 1922)  Labs:  Recent Labs  03/03/13 0455 03/04/13 0559 03/05/13 0454  HGB 6.7* 9.4* 8.9*  HCT 19.6* 27.4* 26.0*  PLT 174 173 157  LABPROT 15.4* 17.5* 20.4*  INR 1.24 1.48 1.82*  HEPARINUNFRC 0.42 0.48 0.20*  CREATININE 1.01  --   --     Estimated Creatinine Clearance: 45.2 ml/min (by C-G formula based on Cr of 1.01).    Assessment: 77 yo male with h/o PE for anticoagulation  Goal of Therapy:  Heparin level 0.3-0.7 units/ml INR 2 - 3 Monitor platelets by anticoagulation protocol: Yes    Plan:  Increase Heparin 900 units/hr Check heparin level in 8 hours. Coumadin 7.5 mg today  Geannie Risen, PharmD, BCPS   03/05/2013, 6:00 AM

## 2013-03-05 NOTE — Care Management Note (Unsigned)
    Page 1 of 1   03/08/2013     4:04:17 PM   CARE MANAGEMENT NOTE 03/08/2013  Patient:  Anthony [name], Anthony [name]   Account Number:  [id number]  Date Initiated:  03/05/2013  Documentation initiated by:  Cerina Leary  Subjective/Objective Assessment:   PT ADM ON 02/28/13 WITH ABD PAIN, COPD, AND ANEMIA.  PTA, PT LIVES WITH SPOUSE.  HE HAS HOME OXYGEN THROUGH [facility], AND A HHA THROUGH [facility].  AIDE COMES DAILY 2HRS/DAY.     Action/Plan:   PT NEEDS HH PHYSCIAL THERAPY, AS RECOMMENDED.  REFERRAL TO [facility], PER PT CHOICE.  START OF CARE 24-48H POST DC DATE.  NO DME NEEDED AT HOME, PER PT.   Anticipated DC Date:  03/09/2013   Anticipated DC Plan:  SKILLED NURSING FACILITY      DC Planning Services  CM consult      Choice offered to / List presented to:  C-1 Patient           Status of service:  In process, will continue to follow Medicare Important Message given?   (If response is "NO", the following Medicare IM given date fields will be blank) Date Medicare IM given:   Date Additional Medicare IM given:    Discharge Disposition:  SKILLED NURSING FACILITY  Per UR Regulation:  Reviewed for med. necessity/level of care/duration of stay  If discussed at Long Length of Stay Meetings, dates discussed:    Comments:  03/08/13 Lemya Greenwell,RN,BSN [phone] PT FOR LIKELY DC TO SNF TOMORROW, PER MD.  CSW UPDATED.  03/07/13 Bobby Barton,RN,BSN [phone] PT REFERRED TO CSW FOR SHORT TERM SNF PLACEMENT.

## 2013-03-05 NOTE — Progress Notes (Signed)
Physical Therapy Treatment Patient Details Name: Anthony [name] MRN: [id number] DOB: 12/14/1935 Today's Date: 03/05/2013 Time: 5621-3086 PT Time Calculation (min): 15 min  PT Assessment / Plan / Recommendation Comments on Treatment Session  Pt adm with abd pain and has COPD.  Pt very motivated to incr activity.  Making steady progress.    Follow Up Recommendations  Home health PT     Does the patient have the potential to tolerate intense rehabilitation     Barriers to Discharge        Equipment Recommendations  None recommended by PT    Recommendations for Other Services    Frequency Min 3X/week   Plan Discharge plan remains appropriate;Frequency remains appropriate    Precautions / Restrictions Precautions Precautions: Fall   Pertinent Vitals/Pain See flow sheet.    Mobility  Transfers Sit to Stand: With upper extremity assist;4: Min guard;With armrests;From chair/3-in-1 Stand to Sit: 5: Supervision;With upper extremity assist;With armrests;To chair/3-in-1 Ambulation/Gait Ambulation/Gait Assistance: 4: Min guard Ambulation Distance (Feet): 60 Feet (60' x 2) Assistive device: Rollator Ambulation/Gait Assistance Details: verbal cues to stand more erect and stay closer to rollator. Gait Pattern: Step-through pattern;Decreased stride length;Trunk flexed Gait velocity: decr General Gait Details: Pt took sitting rest break on seat of rollator x 5-6 minutes.      Exercises     PT Diagnosis:    PT Problem List:   PT Treatment Interventions:     PT Goals Acute Rehab PT Goals PT Goal: Sit to Stand - Progress: Progressing toward goal PT Goal: Stand to Sit - Progress: Progressing toward goal PT Goal: Ambulate - Progress: Progressing toward goal  Visit Information  Last PT Received On: 03/05/13 Assistance Needed: +1    Subjective Data  Subjective: "Can I walk again?"   Cognition  Cognition Arousal/Alertness: Awake/alert Behavior During Therapy: WFL for tasks  assessed/performed Overall Cognitive Status: Within Functional Limits for tasks assessed    Balance     End of Session PT - End of Session Equipment Utilized During Treatment: Oxygen Activity Tolerance: Patient limited by fatigue Patient left: in chair;with call bell/phone within reach   GP     [facility] 03/05/2013, 4:18 PM  [employer] PT [phone]

## 2013-03-05 NOTE — Evaluation (Signed)
Physical Therapy Evaluation Patient Details Name: Anthony [name] MRN: [id number] DOB: [birth year] Today's Date: 03/05/2013 Time: 4098-1191 PT Time Calculation (min): 25 min  PT Assessment / Plan / Recommendation Clinical Impression  Pt adm with abd pain and COPD.  Needs skilled PT to maximize I and safety so pt can return home.  Pt lives in independent senior living apt.  No meals or services are provided there.  Pt has an aide 2.5 hrs/day M-F to help with meals and housecleaning.  Pt is in the process of applying for mobile meals to help provide a second meal during the day. Recommend HHPT at dc.    PT Assessment  Patient needs continued PT services    Follow Up Recommendations  Home health PT    Does the patient have the potential to tolerate intense rehabilitation      Barriers to Discharge        Equipment Recommendations  None recommended by PT    Recommendations for Other Services     Frequency Min 3X/week    Precautions / Restrictions Precautions Precautions: Fall   Pertinent Vitals/Pain See flow sheet      Mobility  Bed Mobility Bed Mobility: Supine to Sit;Sitting - Scoot to Edge of Bed Supine to Sit: 6: Modified independent (Device/Increase time);HOB elevated Sitting - Scoot to Edge of Bed: 6: Modified independent (Device/Increase time) Transfers Transfers: Sit to Stand;Stand to Sit Sit to Stand: 4: Min guard;With upper extremity assist;From bed;From chair/3-in-1 Stand to Sit: 5: Supervision;With upper extremity assist;With armrests;To chair/3-in-1 Ambulation/Gait Ambulation/Gait Assistance: 4: Min guard Ambulation Distance (Feet): 50 Feet (50' x 2) Assistive device: Rollator Ambulation/Gait Assistance Details: verbal cues to stand more erect and stay closer to rollator Gait Pattern: Step-through pattern;Decreased stride length;Trunk flexed Gait velocity: decr General Gait Details: Pt took sitting rest break on seat of rollator    Exercises     PT  Diagnosis: Difficulty walking;Generalized weakness  PT Problem List: Decreased strength;Decreased activity tolerance;Decreased mobility;Decreased balance PT Treatment Interventions: DME instruction;Gait training;Patient/family education;Functional mobility training;Therapeutic activities;Therapeutic exercise;Balance training   PT Goals Acute Rehab PT Goals PT Goal Formulation: With patient Time For Goal Achievement: 03/12/13 Potential to Achieve Goals: Good Pt will go Sit to Stand: with modified independence PT Goal: Sit to Stand - Progress: Goal set today Pt will go Stand to Sit: with modified independence PT Goal: Stand to Sit - Progress: Goal set today Pt will Ambulate: 51 - 150 feet;with modified independence PT Goal: Ambulate - Progress: Goal set today  Visit Information  Last PT Received On: 03/05/13 Assistance Needed: +1    Subjective Data  Subjective: "I plan on getting a lot better," pt stated when asked if he thought he could manage at home. Patient Stated Goal: Return home   Prior Functioning  Home Living Lives With: Alone Available Help at Discharge: Personal care attendant (aide 2.5 hrs/day M - F) Type of Home: Apartment Home Access: Level entry Home Layout: One level Home Adaptive Equipment: Walker - four wheeled;Raised toilet seat with rails;Grab bars around toilet;Grab bars in shower;Other (comment) (O2) Prior Function Level of Independence: Needs assistance Needs Assistance: Meal Prep;Light Housekeeping Meal Prep: Moderate Light Housekeeping: Total Communication Communication: No difficulties    Cognition  Cognition Arousal/Alertness: Awake/alert Behavior During Therapy: WFL for tasks assessed/performed Overall Cognitive Status: Within Functional Limits for tasks assessed    Extremity/Trunk Assessment Right Lower Extremity Assessment RLE ROM/Strength/Tone: Deficits RLE ROM/Strength/Tone Deficits: grossly 4/5 Left Lower Extremity Assessment LLE  ROM/Strength/Tone: Deficits LLE  ROM/Strength/Tone Deficits: grossly 4/5   Balance Balance Balance Assessed: Yes Static Sitting Balance Static Sitting - Balance Support: No upper extremity supported Static Sitting - Level of Assistance: 7: Independent  End of Session PT - End of Session Equipment Utilized During Treatment: Gait belt;Oxygen Activity Tolerance: Patient limited by fatigue Patient left: in chair;with call bell/phone within reach Nurse Communication: Mobility status  GP     [facility] 03/05/2013, 10:33 AM  Skip Mayer PT [phone]

## 2013-03-05 NOTE — Progress Notes (Signed)
ANTICOAGULATION CONSULT NOTE  Pharmacy Consult:  Heparin / Coumadin Indication:  History of PE  No Known Allergies  Labs:  Recent Labs  03/03/13 0455 03/04/13 0559 03/05/13 0454 03/05/13 1353  HGB 6.7* 9.4* 8.9*  --   HCT 19.6* 27.4* 26.0*  --   PLT 174 173 157  --   LABPROT 15.4* 17.5* 20.4*  --   INR 1.24 1.48 1.82*  --   HEPARINUNFRC 0.42 0.48 0.20* 0.12*  CREATININE 1.01  --  1.02  --     Estimated Creatinine Clearance: 46.3 ml/min (by C-G formula based on Cr of 1.02).  Assessment: 77 yo male with h/o PE for anticoagulation  Goal of Therapy:  Heparin level 0.3-0.7 units/ml INR 2 - 3 Monitor platelets by anticoagulation protocol: Yes    Plan:  Increase Heparin 1100 units/hr Check heparin level in 8 hours.  Thank you. Okey Regal, PharmD [phone]   03/05/2013, 2:52 PM

## 2013-03-06 ENCOUNTER — Inpatient Hospital Stay (HOSPITAL_COMMUNITY): Payer: PRIVATE HEALTH INSURANCE

## 2013-03-06 LAB — CBC
Hemoglobin: 8.9 g/dL — ABNORMAL LOW (ref 13.0–17.0)
MCH: 21.3 pg — ABNORMAL LOW (ref 26.0–34.0)
MCHC: 33.7 g/dL (ref 30.0–36.0)
Platelets: 168 10*3/uL (ref 150–400)
RBC: 4.17 MIL/uL — ABNORMAL LOW (ref 4.22–5.81)

## 2013-03-06 LAB — HEPARIN LEVEL (UNFRACTIONATED): Heparin Unfractionated: 0.46 IU/mL (ref 0.30–0.70)

## 2013-03-06 LAB — PROTIME-INR
INR: 1.55 — ABNORMAL HIGH (ref 0.00–1.49)
Prothrombin Time: 18.1 seconds — ABNORMAL HIGH (ref 11.6–15.2)

## 2013-03-06 LAB — GLUCOSE, CAPILLARY: Glucose-Capillary: 85 mg/dL (ref 70–99)

## 2013-03-06 MED ORDER — WARFARIN SODIUM 2.5 MG PO TABS
12.5000 mg | ORAL_TABLET | Freq: Once | ORAL | Status: AC
  Administered 2013-03-06: 12.5 mg via ORAL
  Filled 2013-03-06: qty 1

## 2013-03-06 NOTE — Progress Notes (Signed)
ANTICOAGULATION CONSULT NOTE - Follow Up Consult  Pharmacy Consult:  Heparin / Coumadin Indication:  History of PE  No Known Allergies  Patient Measurements: Height: 5\' 10"  (177.8 cm) Weight: 113 lb 8 oz (51.483 kg) IBW/kg (Calculated) : 73 Heparin Dosing Weight: 51 kg  Vital Signs: Temp: 98.3 F (36.8 C) (05/06 0422) Temp src: Oral (05/06 0422) BP: 126/54 mmHg (05/06 0422) Pulse Rate: 99 (05/06 0422)  Labs:  Recent Labs  03/04/13 0559 03/05/13 0454 03/05/13 1353 03/05/13 2322 03/06/13 0601  HGB 9.4* 8.9*  --   --  8.9*  HCT 27.4* 26.0*  --   --  26.4*  PLT 173 157  --   --  168  LABPROT 17.5* 20.4*  --   --  18.1*  INR 1.48 1.82*  --   --  1.55*  HEPARINUNFRC 0.48 0.20* 0.12* 0.29* 0.46  CREATININE  --  1.02  --   --   --     Estimated Creatinine Clearance: 45.6 ml/min (by C-G formula based on Cr of 1.02).    Assessment: 58 YOM with history of sickle cell anemia admitted with abdominal pain.  Pharmacy managing heparin and coumadin for history of pulmonary embolism.  Heparin level = 0.46 at goal on IV heparin 1100 units/hrl; INR = 1.55 (noted 1.82 on 5/5).  Home dose reported as 10 mg daily, last taken on 4/29.  Noted he continues on Cipro, which could increase the effect of Coumadin.    Goal of Therapy:  Heparin level 0.3-0.7 units/ml INR 2 - 3 Monitor platelets by anticoagulation protocol: Yes    Plan:  -Coumadin 12.5mg  today -Daily PT/INR -No heparin changes needed -Daily CBC and heparin level   Harland German, Pharm D 03/06/2013 8:30 AM

## 2013-03-06 NOTE — Progress Notes (Signed)
ANTICOAGULATION CONSULT NOTE  Pharmacy Consult:  Heparin  Indication:  History of PE  No Known Allergies  Patient Measurements: Height: 5\' 10"  (177.8 cm) Weight: 115 lb 3.2 oz (52.254 kg) IBW/kg (Calculated) : 73 Heparin Dosing Weight: 51 kg  Vital Signs: Temp: 98.4 F (36.9 C) (05/05 1940) Temp src: Oral (05/05 1940) BP: 119/54 mmHg (05/05 1940) Pulse Rate: 93 (05/05 1940)  Labs:  Recent Labs  03/03/13 0455 03/04/13 0559 03/05/13 0454 03/05/13 1353 03/05/13 2322  HGB 6.7* 9.4* 8.9*  --   --   HCT 19.6* 27.4* 26.0*  --   --   PLT 174 173 157  --   --   LABPROT 15.4* 17.5* 20.4*  --   --   INR 1.24 1.48 1.82*  --   --   HEPARINUNFRC 0.42 0.48 0.20* 0.12* 0.29*  CREATININE 1.01  --  1.02  --   --     Estimated Creatinine Clearance: 46.3 ml/min (by C-G formula based on Cr of 1.02).  Assessment: 77 yo male with h/o PE for anticoagulation  Goal of Therapy:  Heparin level 0.3-0.7 units/ml Monitor platelets by anticoagulation protocol: Yes    Plan:  Heparin level nearly at goal and may continue to increase.  Will not change at this time, follow up AM labs and adjustnot in range and INR still < 2.   Geannie Risen, PharmD, BCPS   03/06/2013, 12:25 AM

## 2013-03-06 NOTE — Progress Notes (Addendum)
[facility] PROGRESS NOTE  Anthony [name] EAV:[id number] DOB: [birth year] DOA: 02/28/2013 PCP: Dorrene German, MD  Assessment/Plan: Abdominal pain most likely secondary to pancreatitis vis sickle call anemia -  advance diet as tolerated. Continue with pain relief medications. Patient states he drinks only alcohol once a week and last drink was more than a week ago. His LFTs are normal at this time. triglyceride levels are ok  COPD - on exam he was not wheezing. Continue with nebulizers.   Anemia- no sign of GI bleeding- ?sickle cell crisis- check Fe panel- add Fe; occult blood pending- trend- transfused 2 units 5/3- patient feeling better  History of PE on Coumadin - coumadin- bridging with heparin  Sickle cell anemia - follow CBC closely.   Tobacco abuse - advised patient to quit smoking   Code Status: full Family Communication: patient Disposition Plan: home   Consultants:  none  Procedures:    Antibiotics:    HPI/Subjective: Asking for more food Minimal belly pain  Objective: Filed Vitals:   03/05/13 2102 03/06/13 0108 03/06/13 0422 03/06/13 0851  BP:   126/54   Pulse:   99   Temp:   98.3 F (36.8 C)   TempSrc:   Oral   Resp:   18   Height:      Weight:   51.483 kg (113 lb 8 oz)   SpO2: 100% 96% 100% 97%    Intake/Output Summary (Last 24 hours) at 03/06/13 1155 Last data filed at 03/06/13 1147  Gross per 24 hour  Intake    627 ml  Output   2000 ml  Net  -1373 ml   Filed Weights   03/04/13 0424 03/05/13 0559 03/06/13 0422  Weight: 50.621 kg (111 lb 9.6 oz) 52.254 kg (115 lb 3.2 oz) 51.483 kg (113 lb 8 oz)    Exam:  General: Well developed and moderately nourished.   Cardiovascular: S1-S2 heard.  Respiratory: No rhonchi or crepitations, no wheezing- tight L>R Abdomen: dec BS, soft  Skin: No rash.   Data Reviewed: Basic Metabolic Panel:  Recent Labs Lab 02/28/13 2142 03/01/13 0500 03/03/13 0455 03/05/13 0454  NA 133* 133*  133* 137  K 4.2 4.6 3.9 4.2  CL 95* 95* 97 98  CO2 30 32 32 34*  GLUCOSE 97 90 92 109*  BUN 11 13 11 11   CREATININE 1.05 1.00 1.01 1.02  CALCIUM 8.8 8.9 8.6 8.8   Liver Function Tests:  Recent Labs Lab 02/28/13 2142 03/01/13 0500  AST 25 25  ALT 9 8  ALKPHOS 53 54  BILITOT 0.4 0.4  PROT 7.5 7.6  ALBUMIN 3.6 3.6    Recent Labs Lab 02/28/13 2142 03/01/13 0500  LIPASE 157* 86*   No results found for this basename: AMMONIA,  in the last 168 hours CBC:  Recent Labs Lab 02/28/13 2142 03/01/13 0500 03/02/13 0540 03/03/13 0455 03/04/13 0559 03/05/13 0454 03/06/13 0601  WBC 5.8 6.6 5.0 4.3 5.0 5.1 4.9  NEUTROABS 3.7 3.7  --   --   --   --   --   HGB 8.0* 8.8* 7.5* 6.7* 9.4* 8.9* 8.9*  HCT 24.0* 26.7* 22.7* 19.6* 27.4* 26.0* 26.4*  MCV 57.6* 58.4* 58.4* 57.0* 62.0* 62.4* 63.3*  PLT 246 229 202 174 173 157 168   Cardiac Enzymes:  Recent Labs Lab 03/01/13 0500  TROPONINI <0.30   BNP (last 3 results) No results found for this basename: PROBNP,  in the last 8760 hours CBG:  Recent Labs  Lab 03/05/13 0606 03/05/13 1110 03/05/13 1650 03/05/13 2046 03/06/13 0610  GLUCAP 93 137* 103* 100* 85    No results found for this or any previous visit (from the past 240 hour(s)).   Studies: Dg Chest Port 1 View  03/06/2013  *RADIOLOGY REPORT*  Clinical Data: Shortness of breath.  PORTABLE CHEST - 1 VIEW  Comparison: 03/01/2013 and 12/28/2012.  Findings: 1045 hours.  The heart size and mediastinal contours are stable.  There is stable chronic blunting of the costophrenic angles, right greater than left.  There is stable hyperinflation of the lungs with mild basilar scarring.  No superimposed airspace disease, edema or significant pleural effusion is identified.  IMPRESSION: Stable chronic lung disease with costophrenic angle blunting.  No acute findings identified.   Original Report Authenticated By: Carey Bullocks, M.D.     Scheduled Meds: . albuterol  2.5 mg  Nebulization Q4H  . ferrous sulfate  325 mg Oral BID WC  . fluticasone  2 spray Each Nare Daily  . ipratropium  0.5 mg Nebulization Q4H  . pantoprazole  40 mg Oral Q1200  . senna-docusate  1 tablet Oral BID  . sodium chloride  3 mL Intravenous Q12H  . tamsulosin  0.4 mg Oral QPC supper  . warfarin  12.5 mg Oral ONCE-1800  . Warfarin - Pharmacist Dosing Inpatient   Does not apply q1800   Continuous Infusions: . heparin 1,100 Units/hr (03/05/13 1453)    Principal Problem:   Abdominal pain Active Problems:   SICKLE CELL ANEMIA   COPD   PULMONARY EMBOLISM, HX OF    Time spent: 35    [facility], Hines Kloss  [facility] Pager [phone] If 7PM-7AM, please contact night-coverage at [email or web address], password [facility] 03/06/2013, 11:55 AM  LOS: 6 days

## 2013-03-06 NOTE — Progress Notes (Signed)
Physical Therapy Treatment Patient Details Name: Anthony [name] MRN: [id number] DOB: [birth year] Today's Date: 03/06/2013 Time: 4098-1191 PT Time Calculation (min): 30 min  PT Assessment / Plan / Recommendation Comments on Treatment Session  Pt adm with abd pain and has COPD.  Pt very motivated to incr activity.  Making steady progress.    Follow Up Recommendations  Home health PT     Does the patient have the potential to tolerate intense rehabilitation     Barriers to Discharge        Equipment Recommendations  None recommended by PT    Recommendations for Other Services    Frequency Min 3X/week   Plan Discharge plan remains appropriate;Frequency remains appropriate    Precautions / Restrictions Precautions Precautions: Fall   Pertinent Vitals/Pain Dyspnea 3/4 with amb on 3L    Mobility  Bed Mobility Supine to Sit: 6: Modified independent (Device/Increase time);HOB elevated Sitting - Scoot to Edge of Bed: 6: Modified independent (Device/Increase time) Transfers Sit to Stand: 5: Supervision;With upper extremity assist;With armrests;From chair/3-in-1;From bed Stand to Sit: 5: Supervision;With upper extremity assist;With armrests;To chair/3-in-1 Ambulation/Gait Ambulation/Gait Assistance: 5: Supervision Ambulation Distance (Feet): 80 Feet (80' x 3 with sitting rest breaks on rollator for ~ 5 minutes) Assistive device: Rollator Ambulation/Gait Assistance Details: Pt continues to prop hands on rollator while amb in order to facilitate breathing.  Pt slightly more upright today. Gait Pattern: Step-through pattern;Decreased stride length;Trunk flexed Gait velocity: decr    Exercises     PT Diagnosis:    PT Problem List:   PT Treatment Interventions:     PT Goals Acute Rehab PT Goals PT Goal: Sit to Stand - Progress: Progressing toward goal PT Goal: Stand to Sit - Progress: Progressing toward goal PT Goal: Ambulate - Progress: Progressing toward goal  Visit  Information  Last PT Received On: 03/06/13 Assistance Needed: +1    Subjective Data  Subjective: Pt states he is feeling better.   Cognition  Cognition Arousal/Alertness: Awake/alert Behavior During Therapy: WFL for tasks assessed/performed Overall Cognitive Status: Within Functional Limits for tasks assessed    Balance  Static Sitting Balance Static Sitting - Balance Support: No upper extremity supported Static Sitting - Level of Assistance: 7: Independent  End of Session PT - End of Session Equipment Utilized During Treatment: Oxygen Activity Tolerance: Patient limited by fatigue Patient left: in chair;with call bell/phone within reach Nurse Communication: Mobility status   GP     [facility] 03/06/2013, 3:58 PM  [facility] PT [phone]

## 2013-03-07 LAB — CBC
HCT: 25 % — ABNORMAL LOW (ref 39.0–52.0)
Hemoglobin: 8.4 g/dL — ABNORMAL LOW (ref 13.0–17.0)
MCH: 21.5 pg — ABNORMAL LOW (ref 26.0–34.0)
MCHC: 33.6 g/dL (ref 30.0–36.0)
MCV: 64.1 fL — ABNORMAL LOW (ref 78.0–100.0)
Platelets: 153 K/uL (ref 150–400)
RBC: 3.9 MIL/uL — ABNORMAL LOW (ref 4.22–5.81)
RDW: 30.7 % — ABNORMAL HIGH (ref 11.5–15.5)
WBC: 4.5 K/uL (ref 4.0–10.5)

## 2013-03-07 LAB — OCCULT BLOOD X 1 CARD TO LAB, STOOL: Fecal Occult Bld: POSITIVE — AB

## 2013-03-07 LAB — PROTIME-INR
INR: 1.68 — ABNORMAL HIGH (ref 0.00–1.49)
Prothrombin Time: 19.2 s — ABNORMAL HIGH (ref 11.6–15.2)

## 2013-03-07 LAB — HEPARIN LEVEL (UNFRACTIONATED): Heparin Unfractionated: 0.67 IU/mL (ref 0.30–0.70)

## 2013-03-07 LAB — GLUCOSE, CAPILLARY

## 2013-03-07 MED ORDER — ENSURE COMPLETE PO LIQD
237.0000 mL | Freq: Two times a day (BID) | ORAL | Status: DC
  Administered 2013-03-07 – 2013-03-09 (×3): 237 mL via ORAL

## 2013-03-07 MED ORDER — WARFARIN SODIUM 10 MG PO TABS
12.5000 mg | ORAL_TABLET | Freq: Once | ORAL | Status: AC
  Administered 2013-03-07: 12.5 mg via ORAL
  Filled 2013-03-07: qty 1

## 2013-03-07 NOTE — Clinical Social Work Note (Addendum)
Clinical Social Work Department CLINICAL SOCIAL WORK PLACEMENT NOTE 03/07/2013  Patient:  Anthony [name], Anthony [name]  Account Number:  [id number] Admit date:  02/28/2013  Clinical Social Worker:  Macario Golds, LCSW  Date/time:  03/07/2013 03:30 PM  Clinical Social Work is seeking post-discharge placement for this patient at the following level of care:   SKILLED NURSING   (*CSW will update this form in Epic as items are completed)   03/07/2013  Patient/family provided with [facility] Department of Clinical Social Work's list of facilities offering this level of care within the geographic area requested by the patient (or if unable, by the patient's family).  03/07/2013  Patient/family informed of their freedom to choose among providers that offer the needed level of care, that participate in Medicare, Medicaid or managed care program needed by the patient, have an available bed and are willing to accept the patient.  03/07/2013  Patient/family informed of [facility]' ownership interest in [facility], as well as of the fact that they are under no obligation to receive care at this facility.  PASARR submitted to EDS on 03/07/2013 PASARR number received from EDS on 03/07/2013  FL2 transmitted to all facilities in geographic area requested by pt/family on  03/07/2013 FL2 transmitted to all facilities within larger geographic area on   Patient informed that his/her managed care company has contracts with or will negotiate with  certain facilities, including the following:     Patient/family informed of bed offers received: 03/08/2013   Patient chooses bed at  [facility] Physician recommends and patient chooses bed at    Patient to be transferred to [facility] on  03/09/2013 Patient to be transferred to facility by  Ambulance  The following physician request were entered in Epic:   Additional Comments: 05/07 Patient requested placement at [facility]

## 2013-03-07 NOTE — Clinical Social Work Note (Signed)
Clinical Social Work Department BRIEF PSYCHOSOCIAL ASSESSMENT 03/07/2013  Patient:  Anthony [name], Anthony [name]     Account Number:  [id number]     Admit date:  02/28/2013  Clinical Social Worker:  Verl Blalock  Date/Time:  03/07/2013 03:00 PM  Referred by:  Physician  Date Referred:  03/07/2013 Referred for  SNF Placement   Other Referral:   Interview type:  Patient Other interview type:   No family currently present at bedside    PSYCHOSOCIAL DATA Living Status:  ALONE Admitted from facility:   Level of care:   Primary support name:  Anthony [name]  [phone] Primary support relationship to patient:  CHILD, ADULT Degree of support available:   Fair    CURRENT CONCERNS Current Concerns  Post-Acute Placement   Other Concerns:    SOCIAL WORK ASSESSMENT / PLAN Clinical Social Worker met with patient at bedside to offer support and discuss patient needs at discharge.  Patient states that he lives at home alone and has limited supports at discharge.  Patient has been to [facility] in the past and would like to go back if possible.  Patient expressed concerns regarding how he would get his belongings prior to transfer - CSW encouraged patient to communicate with friends and family to pack him a bag.  CSW to complete FL2 and initiate referral to [facility].  CSW to follow up with patient regarding bed offer and facilitate patient discharge needs once medically stable.   Assessment/plan status:  Psychosocial Support/Ongoing Assessment of Needs Other assessment/ plan:   Information/referral to community resources:   Patient familiar with SNF search process from previous placement and did not identify other resources    PATIENT'S/FAMILY'S RESPONSE TO PLAN OF CARE: Patient alert and oriented x3 laying in the bed taking a nap upon CSW arrival.  Patient easily aroused and was able to fully engage in assessment process.  Patient understands that it is not safe for an  immediate return home and agreeable to ST-SNF placement.  Patient verbalized his appreciation for CSW support and concern.   Anthony [name], [place] [phone]

## 2013-03-07 NOTE — Progress Notes (Signed)
[facility] PROGRESS NOTE  LOWELL MAKARA ZOX:[id number] DOB: 1936/04/30 DOA: 02/28/2013 PCP: Dorrene German, MD  Assessment/Plan: Abdominal pain most likely secondary to pancreatitis - patient was kept NPO initially. We did advance diet as tolerated. Patient states he drinks only alcohol once a week and last drink was more than a week ago. His LFTs are normal at this time. triglyceride levels are ok By 03/07/13 patient said that it was getting better . Ct abdomen and pelvis without findings of severe necrotizing pancreatitis.    COPD -advanced with chronic respiratory failure  on exam he was not wheezing. Continue with nebulizers.   Anemia- no sign of GI bleeding- ?sickle cell crisis- check Fe panel- add Fe; occult blood pending- trend- transfused 2 units 5/3- patient feeling better.  No further Hb drop  History of PE on Coumadin - coumadin- resumed. Was on heparin bridge until 5/7  Sickle cell anemia - follow CBC closely.   Tobacco abuse - advised patient to quit smoking   Code Status: full Family Communication: patient Disposition Plan: snf       HPI/Subjective: Very weak when trying to move around the room.   Objective: Filed Vitals:   03/06/13 2339 03/07/13 0349 03/07/13 0433 03/07/13 0807  BP:   135/71   Pulse:   98   Temp:   98.4 F (36.9 C)   TempSrc:   Oral   Resp:   18   Height:      Weight:   52.164 kg (115 lb)   SpO2: 97% 97% 98% 97%   Patient Vitals for the past 24 hrs:  BP Temp Temp src Pulse Resp SpO2 Weight  03/07/13 0807 - - - - - 97 % -  03/07/13 0433 135/71 mmHg 98.4 F (36.9 C) Oral 98 18 98 % 52.164 kg (115 lb)  03/07/13 0349 - - - - - 97 % -  03/06/13 2339 - - - - - 97 % -  03/06/13 2012 117/51 mmHg 97.8 F (36.6 C) Oral 92 18 97 % -  03/06/13 2000 - - - - - 100 % -  03/06/13 1350 113/62 mmHg 98.5 F (36.9 C) Oral 97 19 99 % -  03/06/13 1300 - - - - - 98 % -     Intake/Output Summary (Last 24 hours) at 03/07/13 1104 Last data  filed at 03/07/13 0900  Gross per 24 hour  Intake  81191 ml  Output   1750 ml  Net  11247 ml   Filed Weights   03/05/13 0559 03/06/13 0422 03/07/13 0433  Weight: 52.254 kg (115 lb 3.2 oz) 51.483 kg (113 lb 8 oz) 52.164 kg (115 lb)    Exam:  General: cachectic   Cardiovascular: S1-S2 heard. , tachy  Respiratory: No rhonchi or crepitations, no wheezing Abdomen: , soft , non tender    Data Reviewed: Basic Metabolic Panel:  Recent Labs Lab 02/28/13 2142 03/01/13 0500 03/03/13 0455 03/05/13 0454  NA 133* 133* 133* 137  K 4.2 4.6 3.9 4.2  CL 95* 95* 97 98  CO2 30 32 32 34*  GLUCOSE 97 90 92 109*  BUN 11 13 11 11   CREATININE 1.05 1.00 1.01 1.02  CALCIUM 8.8 8.9 8.6 8.8   Liver Function Tests:  Recent Labs Lab 02/28/13 2142 03/01/13 0500  AST 25 25  ALT 9 8  ALKPHOS 53 54  BILITOT 0.4 0.4  PROT 7.5 7.6  ALBUMIN 3.6 3.6    Recent Labs Lab 02/28/13 2142 03/01/13  0500  LIPASE 157* 86*   No results found for this basename: AMMONIA,  in the last 168 hours CBC:  Recent Labs Lab 02/28/13 2142 03/01/13 0500  03/03/13 0455 03/04/13 0559 03/05/13 0454 03/06/13 0601 03/07/13 0500  WBC 5.8 6.6  < > 4.3 5.0 5.1 4.9 4.5  NEUTROABS 3.7 3.7  --   --   --   --   --   --   HGB 8.0* 8.8*  < > 6.7* 9.4* 8.9* 8.9* 8.4*  HCT 24.0* 26.7*  < > 19.6* 27.4* 26.0* 26.4* 25.0*  MCV 57.6* 58.4*  < > 57.0* 62.0* 62.4* 63.3* 64.1*  PLT 246 229  < > 174 173 157 168 153  < > = values in this interval not displayed. Cardiac Enzymes:  Recent Labs Lab 03/01/13 0500  TROPONINI <0.30   BNP (last 3 results) No results found for this basename: PROBNP,  in the last 8760 hours CBG:  Recent Labs Lab 03/05/13 0606 03/05/13 1110 03/05/13 1650 03/05/13 2046 03/06/13 0610  GLUCAP 93 137* 103* 100* 85    No results found for this or any previous visit (from the past 240 hour(s)).   Studies: Dg Chest Port 1 View  03/06/2013  *RADIOLOGY REPORT*  Clinical Data: Shortness of  breath.  PORTABLE CHEST - 1 VIEW  Comparison: 03/01/2013 and 12/28/2012.  Findings: 1045 hours.  The heart size and mediastinal contours are stable.  There is stable chronic blunting of the costophrenic angles, right greater than left.  There is stable hyperinflation of the lungs with mild basilar scarring.  No superimposed airspace disease, edema or significant pleural effusion is identified.  IMPRESSION: Stable chronic lung disease with costophrenic angle blunting.  No acute findings identified.   Original Report Authenticated By: Carey Bullocks, M.D.     Scheduled Meds: . albuterol  2.5 mg Nebulization Q4H  . ferrous sulfate  325 mg Oral BID WC  . fluticasone  2 spray Each Nare Daily  . ipratropium  0.5 mg Nebulization Q4H  . pantoprazole  40 mg Oral Q1200  . senna-docusate  1 tablet Oral BID  . sodium chloride  3 mL Intravenous Q12H  . tamsulosin  0.4 mg Oral QPC supper  . warfarin  12.5 mg Oral ONCE-1800  . Warfarin - Pharmacist Dosing Inpatient   Does not apply q1800   Continuous Infusions: . heparin 1,100 Units/hr (03/06/13 1805)    Principal Problem:   Abdominal pain Active Problems:   SICKLE CELL ANEMIA   COPD   PULMONARY EMBOLISM, HX OF     Latressa Harries  [facility] Pager [phone] If 7PM-7AM, please contact night-coverage at [email or web address], password [facility] 03/07/2013, 11:04 AM  LOS: 7 days

## 2013-03-07 NOTE — Progress Notes (Signed)
NUTRITION FOLLOW UP  DOCUMENTATION CODES Per approved criteria  -Severe malnutrition in the context of chronic illness -Underweight   Intervention:    Ensure Complete twice daily (350 kcals, 13 gm protein per 8 fl oz bottle) RD to follow for nutrition care plan  Nutrition Dx:   Increased nutrient needs related to COPD, malnutrition as evidenced by estimated nutrition needs, ongoing  Goal:   Oral intake with meals & supplements to meet >/= 90% of estimated nutrition needs, progressing  Monitor:   PO & supplemental intake, weight, labs, I/O's  Assessment:   Patient reports his appetite is pretty good.  Abdominal pain better.  PO intake variable at 25-100% per flowsheet records.  He states he is receiving and drinking Ensure supplements; order not in place ---> RD to add in Order Management.  Height: Ht Readings from Last 1 Encounters:  03/01/13 5\' 10"  (1.778 m)    Weight Status:   Wt Readings from Last 1 Encounters:  03/07/13 115 lb (52.164 kg)    Re-estimated needs:  Kcal: 1700-1900 Protein: 80-90 gm Fluid: 1.7-1.9 L  Skin: Intact  Diet Order: Cardiac   Intake/Output Summary (Last 24 hours) at 03/07/13 1155 Last data filed at 03/07/13 0900  Gross per 24 hour  Intake  14782 ml  Output   1450 ml  Net  11547 ml    Last BM: 5/5  Labs:   Recent Labs Lab 03/01/13 0500 03/03/13 0455 03/05/13 0454  NA 133* 133* 137  K 4.6 3.9 4.2  CL 95* 97 98  CO2 32 32 34*  BUN 13 11 11   CREATININE 1.00 1.01 1.02  CALCIUM 8.9 8.6 8.8  GLUCOSE 90 92 109*    CBG (last 3)   Recent Labs  03/05/13 1650 03/05/13 2046 03/06/13 0610  GLUCAP 103* 100* 85    Scheduled Meds: . albuterol  2.5 mg Nebulization Q4H  . ferrous sulfate  325 mg Oral BID WC  . fluticasone  2 spray Each Nare Daily  . ipratropium  0.5 mg Nebulization Q4H  . pantoprazole  40 mg Oral Q1200  . senna-docusate  1 tablet Oral BID  . sodium chloride  3 mL Intravenous Q12H  . tamsulosin  0.4 mg  Oral QPC supper  . warfarin  12.5 mg Oral ONCE-1800  . Warfarin - Pharmacist Dosing Inpatient   Does not apply q1800    Continuous Infusions:   Maureen Chatters, RD, LDN Pager #: [phone] After-Hours Pager #: [phone]

## 2013-03-07 NOTE — Evaluation (Signed)
Occupational Therapy Evaluation Patient Details Name: Anthony [name] MRN: [id number] DOB: 1936/03/31 Today's Date: 03/07/2013 Time: 6295-2841 OT Time Calculation (min): 20 min  OT Assessment / Plan / Recommendation Clinical Impression  Pt admitted with abdominal pain and hx of COPD.  Will benefit from continued OT services to address below problem list.  Recommending ST SNF to further progress rehab before eventual return home.    OT Assessment  Patient needs continued OT Services    Follow Up Recommendations  SNF    Barriers to Discharge Decreased caregiver support lives alone. does not have 24/7 sup/assist.  Equipment Recommendations  None recommended by OT    Recommendations for Other Services    Frequency  Min 2X/week    Precautions / Restrictions Precautions Precautions: Fall (Simultaneous filing. User may not have seen previous data.) Restrictions Weight Bearing Restrictions: No   Pertinent Vitals/Pain Pt on 3L O2 nasal canula throughout session 96-98% O2 sats. 4/4 dyspnea during functional mobility and required several seated rest breaks.    ADL  Eating/Feeding: Performed;Independent Where Assessed - Eating/Feeding: Edge of bed Grooming: Performed;Wash/dry hands;Wash/dry face;Supervision/safety Where Assessed - Grooming: Supported standing Upper Body Bathing: Simulated;Set up Where Assessed - Upper Body Bathing: Unsupported sitting Lower Body Bathing: Simulated;Supervision/safety Where Assessed - Lower Body Bathing: Unsupported sit to stand Upper Body Dressing: Simulated;Set up Where Assessed - Upper Body Dressing: Unsupported sitting Lower Body Dressing: Simulated;Supervision/safety Where Assessed - Lower Body Dressing: Unsupported sit to stand Toilet Transfer: Simulated;Supervision/safety Toilet Transfer Method: Sit to Barista:  (bed-ambulating in hall- chair) Equipment Used: Rolling walker;Gait belt Transfers/Ambulation Related to  ADLs: Supervision with RW and multiple seated rest breaks.  ADL Comments: Pt becomes easily fatigued and SOB with basic functional mobility (4/4 dyspnea).   Pt required several seated rest breaks during ambulation. Reports he uses a rollator at home so he can sit on rollator seat when he gets tired.  Pt reports he fatigued easily at home "but not like it is now.".  Pt requesting breathing treatment at end of session, notified RN.    OT Diagnosis: Generalized weakness  OT Problem List: Decreased strength;Decreased activity tolerance;Cardiopulmonary status limiting activity OT Treatment Interventions: Self-care/ADL training;DME and/or AE instruction;Therapeutic activities;Patient/family education;Energy conservation   OT Goals Acute Rehab OT Goals OT Goal Formulation: With patient Time For Goal Achievement: 03/14/13 Potential to Achieve Goals: Good ADL Goals Pt Will Transfer to Toilet: with modified independence;Ambulation;with DME;Comfort height toilet ADL Goal: Toilet Transfer - Progress: Goal set today Miscellaneous OT Goals Miscellaneous OT Goal #1: Pt will retrieve ADL items at mod I level. OT Goal: Miscellaneous Goal #1 - Progress: Goal set today Miscellaneous OT Goal #2: Pt will independently implement  energy conservation strategies as needed during ADLs. OT Goal: Miscellaneous Goal #2 - Progress: Goal set today  Visit Information  Last OT Received On: 03/07/13 Assistance Needed: +1 (requires chair follow during amblulation for rest break Simultaneous filing. User may not have seen previous data.)    Subjective Data      Prior Functioning     Home Living Lives With: Alone Available Help at Discharge: Personal care attendant Type of Home: Apartment Home Access: Level entry Home Layout: One level Bathroom Shower/Tub: Engineer, manufacturing systems: Standard Home Adaptive Equipment: Walker - four wheeled;Raised toilet seat with rails;Grab bars around toilet;Grab bars in  shower;Other (comment) (O2) Additional Comments: Personal attendant comes 2.5 hrs M-F Prior Function Level of Independence: Needs assistance Needs Assistance: Meal Prep;Light Housekeeping Meal Prep: Moderate Light  Housekeeping: Total Communication Communication: No difficulties         Vision/Perception     Cognition  Cognition Arousal/Alertness: Awake/alert  Behavior During Therapy: WFL for tasks assessed/performed Overall Cognitive Status: Within Functional Limits for tasks assessed   Extremity/Trunk Assessment Right Upper Extremity Assessment RUE ROM/Strength/Tone: [facility] for tasks assessed Left Upper Extremity Assessment LUE ROM/Strength/Tone: WFL for tasks assessed     Mobility Bed Mobility Bed Mobility: Supine to Sit;Sitting - Scoot to Edge of Bed  Supine to Sit: 6: Modified independent (Device/Increase time)  Sitting - Scoot to Edge of Bed: 6: Modified independent (Device/Increase time) Details for Bed Mobility Assistance: pt required time to sit EOB and recover from efforts to get up Transfers Transfers: Sit to Stand;Stand to Sit Sit to Stand: 5: Supervision;From bed;From chair/3-in-1;With upper extremity assist Stand to Sit: 5: Supervision;To chair/3-in-1 Details for Transfer Assistance: Pt performed multiple sit<>stands (due to seated rest breaks). Requires increased time as he becomes fatigued.     Exercise     Balance     End of Session OT - End of Session Equipment Utilized During Treatment: Gait belt Activity Tolerance: Patient limited by fatigue Patient left: in chair;with call bell/phone within reach Nurse Communication: Mobility status (pt requesting breathing treatment)  GO   03/07/2013 Cipriano Mile OTR/L Pager [phone] Office [phone]   Cipriano Mile 03/07/2013, 11:40 AM

## 2013-03-07 NOTE — Progress Notes (Signed)
Physical Therapy Treatment Patient Details Name: Anthony [name] MRN: [id number] DOB: June 20, 1936 Today's Date: 03/07/2013 Time: 0920-0940 PT Time Calculation (min): 20 min  PT Assessment / Plan / Recommendation Comments on Treatment Session  Pt adm with abd pain and has COPD.  Pt very motivated to incr activity.  Making modest progress however is very limited at this point. .Patient unable to complete ambulation over 50 ft without extreme fatigue and increased HR and work of breathing. Spoke with patient and OT at this point updating discharge plan with recommendation for patient consider short term SNF prior to discharge home to progress activity and ensure independence and safety.    Follow Up Recommendations  SNF     Does the patient have the potential to tolerate intense rehabilitation     Barriers to Discharge        Equipment Recommendations  None recommended by PT    Recommendations for Other Services    Frequency Min 3X/week   Plan Discharge plan needs to be updated;Frequency remains appropriate    Precautions / Restrictions Precautions Precautions: Fall Restrictions Weight Bearing Restrictions: No   Pertinent Vitals/Pain No pain at this time; SpO2 >96% on 3 liters, HR 130s with minimal activity, RPE 16    Mobility  Bed Mobility Bed Mobility: Supine to Sit;Sitting - Scoot to Edge of Bed Supine to Sit: 6: Modified independent (Device/Increase time);HOB elevated Sitting - Scoot to Edge of Bed: 6: Modified independent (Device/Increase time) Details for Bed Mobility Assistance: pt required time to sit EOB and recover from efforts to get up Transfers Transfers: Sit to Stand;Stand to Sit Sit to Stand: 5: Supervision;With upper extremity assist;With armrests;From chair/3-in-1;From bed Stand to Sit: 5: Supervision;With upper extremity assist;With armrests;To chair/3-in-1 Details for Transfer Assistance: performed multiple times, pt able to perform but became more unsafe as  patient became fatigued Ambulation/Gait Ambulation/Gait Assistance: 5: Supervision Ambulation Distance (Feet): 60 Feet (3 sitting rest breaks, increased work of breathing,spo2 >96%) Assistive device: Rollator Ambulation/Gait Assistance Details: Pt requires multiple cues for use of rolling walker and upright posture.  Gait Pattern: Step-through pattern;Decreased stride length;Trunk flexed Gait velocity: decr General Gait Details: Patient with difficulty ambulating seoncdary to increased work of breathing despite good saturation levels. Patient HR climbed in 130s with very minimal activity. Stairs: No     PT Goals Acute Rehab PT Goals PT Goal Formulation: With patient Time For Goal Achievement: 03/12/13 Potential to Achieve Goals: Good Pt will go Sit to Stand: with modified independence PT Goal: Sit to Stand - Progress: Progressing toward goal Pt will go Stand to Sit: with modified independence PT Goal: Stand to Sit - Progress: Progressing toward goal Pt will Ambulate: 51 - 150 feet;with modified independence PT Goal: Ambulate - Progress: Progressing toward goal (modestly)  Visit Information  Last PT Received On: 03/07/13 Assistance Needed: +1 (+2 for chair follow)    Subjective Data  Subjective: Pt is feeling better but still having difficulty with breathing   Cognition  Cognition Arousal/Alertness: Awake/alert Behavior During Therapy: WFL for tasks assessed/performed Overall Cognitive Status: Within Functional Limits for tasks assessed       End of Session PT - End of Session Equipment Utilized During Treatment: Oxygen Activity Tolerance: Patient limited by fatigue Patient left: in chair;with call bell/phone within reach Nurse Communication: Mobility status   GP     Fabio Asa 03/07/2013, 11:29 AM Charlotte Crumb, PT DPT  [phone]

## 2013-03-07 NOTE — Progress Notes (Signed)
ANTICOAGULATION CONSULT NOTE - Follow Up Consult  Pharmacy Consult:  Heparin / Coumadin Indication:  History of PE  No Known Allergies  Patient Measurements: Height: 5\' 10"  (177.8 cm) Weight: 115 lb (52.164 kg) IBW/kg (Calculated) : 73 Heparin Dosing Weight: 51 kg  Vital Signs: Temp: 98.4 F (36.9 C) (05/07 0433) Temp src: Oral (05/07 0433) BP: 135/71 mmHg (05/07 0433) Pulse Rate: 98 (05/07 0433)  Labs:  Recent Labs  03/05/13 0454  03/05/13 2322 03/06/13 0601 03/07/13 0500  HGB 8.9*  --   --  8.9* 8.4*  HCT 26.0*  --   --  26.4* 25.0*  PLT 157  --   --  168 153  LABPROT 20.4*  --   --  18.1* 19.2*  INR 1.82*  --   --  1.55* 1.68*  HEPARINUNFRC 0.20*  < > 0.29* 0.46 0.67  CREATININE 1.02  --   --   --   --   < > = values in this interval not displayed.  Estimated Creatinine Clearance: 46.2 ml/min (by C-G formula based on Cr of 1.02).    Assessment: 78 YOM with history of sickle cell anemia admitted with abdominal pain.  Pharmacy managing heparin and coumadin for history of pulmonary embolism.  Heparin level = 0.67 at goal on IV heparin 1100 units/hrl; INR = 1.68 and hg/hct are stable.  Goal of Therapy:  Heparin level 0.3-0.7 units/ml INR 2 - 3 Monitor platelets by anticoagulation protocol: Yes    Plan:  -Continue Coumadin 12.5mg  today -Daily PT/INR -No heparin changes needed -Daily CBC and heparin level   Harland German, Pharm D 03/07/2013 8:56 AM

## 2013-03-08 LAB — PROTIME-INR
INR: 1.88 — ABNORMAL HIGH (ref 0.00–1.49)
Prothrombin Time: 20.9 s — ABNORMAL HIGH (ref 11.6–15.2)

## 2013-03-08 LAB — CBC
HCT: 24.1 % — ABNORMAL LOW (ref 39.0–52.0)
Hemoglobin: 8 g/dL — ABNORMAL LOW (ref 13.0–17.0)
MCV: 64.1 fL — ABNORMAL LOW (ref 78.0–100.0)
WBC: 3.9 10*3/uL — ABNORMAL LOW (ref 4.0–10.5)

## 2013-03-08 MED ORDER — SODIUM CHLORIDE 0.9 % IV SOLN
INTRAVENOUS | Status: DC
  Administered 2013-03-08: 17:00:00 via INTRAVENOUS
  Administered 2013-03-09: 500 mL via INTRAVENOUS

## 2013-03-08 MED ORDER — PEG 3350-KCL-NA BICARB-NACL 420 G PO SOLR
4000.0000 mL | Freq: Once | ORAL | Status: AC
  Administered 2013-03-08: 4000 mL via ORAL
  Filled 2013-03-08: qty 4000

## 2013-03-08 MED ORDER — WARFARIN SODIUM 10 MG PO TABS
10.0000 mg | ORAL_TABLET | Freq: Once | ORAL | Status: DC
  Filled 2013-03-08: qty 1

## 2013-03-08 NOTE — Progress Notes (Signed)
ANTICOAGULATION CONSULT NOTE - Follow Up Consult  Pharmacy Consult:  Coumadin Indication:  History of PE  No Known Allergies  Patient Measurements: Height: 5\' 10"  (177.8 cm) Weight: 115 lb 8.3 oz (52.4 kg) IBW/kg (Calculated) : 73 Heparin Dosing Weight: 51 kg  Vital Signs: Temp: 98.7 F (37.1 C) (05/08 0503) Temp src: Oral (05/08 0503) BP: 123/67 mmHg (05/08 0503) Pulse Rate: 95 (05/08 0503)  Labs:  Recent Labs  03/05/13 2322  03/06/13 0601 03/07/13 0500 03/08/13 0430  HGB  --   < > 8.9* 8.4* 8.0*  HCT  --   --  26.4* 25.0* 24.1*  PLT  --   --  168 153 170  LABPROT  --   --  18.1* 19.2* 20.9*  INR  --   --  1.55* 1.68* 1.88*  HEPARINUNFRC 0.29*  --  0.46 0.67  --   < > = values in this interval not displayed.  Estimated Creatinine Clearance: 46.4 ml/min (by C-G formula based on Cr of 1.02).    Assessment: 18 YOM with history of sickle cell anemia admitted with abdominal pain.  Pharmacy managing  coumadin for history of pulmonary embolism.  NR = 1.88 with trend up and hg/hct are stable.  Goal of Therapy:  Heparin level 0.3-0.7 units/ml INR 2 - 3 Monitor platelets by anticoagulation protocol: Yes    Plan:  - Coumadin 10mg  today -Daily PT/INR  Harland German, Pharm D 03/08/2013 8:37 AM

## 2013-03-08 NOTE — Progress Notes (Signed)
Pt humidification bottle was low; RT changed

## 2013-03-08 NOTE — Progress Notes (Signed)
[facility] PROGRESS NOTE  Anthony [name] MRN:[id number] DOB: 11/20/1935 DOA: 02/28/2013 PCP: AVBUERE,EDWIN A, MD  Assessment/Plan: Abdominal pain most likely secondary to pancreatitis - patient was kept NPO initially. We did advance diet as tolerated. Patient states he drinks only alcohol once a week and last drink was more than a week ago. His LFTs are normal at this time. triglyceride levels are ok By 03/07/13 patient said that pain was getting better . Ct abdomen and pelvis without findings of severe necrotizing pancreatitis.  Patient with epigastric pain and hemoccult positive stool. He has a personal history of PUD. Dw/ Dr. Hung - plan for EGD and colonoscopy. 5/9.    COPD -advanced with chronic respiratory failure  on exam he was not wheezing. Continue with nebulizers. And oxygen   Anemia- no sign of GI bleeding- ?sickle cell crisis vs chronic Gi losses -  transfused 2 units 5/3- patient feeling better.    History of PE on Coumadin - coumadin- resumed. Was on heparin bridge until 5/7. Plan to hold coumadin fro GI procedure 03/08/13   Sickle cell anemia - follow CBC closely.   Tobacco abuse - advised patient to quit smoking   Code Status: full Family Communication: patient Disposition Plan: snf       HPI/Subjective: Abdominal pain is slightly better   Objective: Filed Vitals:   03/08/13 0435 03/08/13 0503 03/08/13 0924 03/08/13 1239  BP:  123/67    Pulse:  95 97 101  Temp:  98.7 F (37.1 C)    TempSrc:  Oral    Resp:  19 20 18  Height:      Weight:  52.4 kg (115 lb 8.3 oz)    SpO2: 100% 100% 98% 100%   Patient Vitals for the past 24 hrs:  BP Temp Temp src Pulse Resp SpO2 Weight  03/08/13 1239 - - - 101 18 100 % -  03/08/13 0924 - - - 97 20 98 % -  03/08/13 0503 123/67 mmHg 98.7 F (37.1 C) Oral 95 19 100 % 52.4 kg (115 lb 8.3 oz)  03/08/13 0435 - - - - - 100 % -  03/08/13 0029 - - - - - 96 % -  03/07/13 2115 125/53 mmHg 98.9 F (37.2 C) Oral 103 20  100 % -  03/07/13 1724 - - - - - 99 % -     Intake/Output Summary (Last 24 hours) at 03/08/13 1412 Last data filed at 03/08/13 1359  Gross per 24 hour  Intake    360 ml  Output   1450 ml  Net  -1090 ml   Filed Weights   03/06/13 0422 03/07/13 0433 03/08/13 0503  Weight: 51.483 kg (113 lb 8 oz) 52.164 kg (115 lb) 52.4 kg (115 lb 8.3 oz)    Exam:  General: cachectic   Cardiovascular: S1-S2 heard. , tachy  Respiratory: No rhonchi or crepitations, no wheezing Abdomen: , soft , non tender    Data Reviewed: Basic Metabolic Panel:  Recent Labs Lab 03/03/13 0455 03/05/13 0454  NA 133* 137  K 3.9 4.2  CL 97 98  CO2 32 34*  GLUCOSE 92 109*  BUN 11 11  CREATININE 1.01 1.02  CALCIUM 8.6 8.8   Liver Function Tests: No results found for this basename: AST, ALT, ALKPHOS, BILITOT, PROT, ALBUMIN,  in the last 168 hours No results found for this basename: LIPASE, AMYLASE,  in the last 168 hours No results found for this basename: AMMONIA,  in the last   168 hours CBC:  Recent Labs Lab 03/04/13 0559 03/05/13 0454 03/06/13 0601 03/07/13 0500 03/08/13 0430  WBC 5.0 5.1 4.9 4.5 3.9*  HGB 9.4* 8.9* 8.9* 8.4* 8.0*  HCT 27.4* 26.0* 26.4* 25.0* 24.1*  MCV 62.0* 62.4* 63.3* 64.1* 64.1*  PLT 173 157 168 153 170   Cardiac Enzymes: No results found for this basename: CKTOTAL, CKMB, CKMBINDEX, TROPONINI,  in the last 168 hours BNP (last 3 results) No results found for this basename: PROBNP,  in the last 8760 hours CBG:  Recent Labs Lab 03/05/13 1110 03/05/13 1650 03/05/13 2046 03/06/13 0610 03/07/13 1637  GLUCAP 137* 103* 100* 85 132*    No results found for this or any previous visit (from the past 240 hour(s)).   Studies: No results found.  Scheduled Meds: . albuterol  2.5 mg Nebulization Q4H  . feeding supplement  237 mL Oral BID BM  . ferrous sulfate  325 mg Oral BID WC  . fluticasone  2 spray Each Nare Daily  . ipratropium  0.5 mg Nebulization Q4H  .  pantoprazole  40 mg Oral Q1200  . senna-docusate  1 tablet Oral BID  . sodium chloride  3 mL Intravenous Q12H  . Warfarin - Pharmacist Dosing Inpatient   Does not apply q1800   Continuous Infusions:    Principal Problem:   Abdominal pain Active Problems:   SICKLE CELL ANEMIA   COPD   PULMONARY EMBOLISM, HX OF     Anthony [name]  [facility] Pager [phone] If 7PM-7AM, please contact night-coverage at [email or web address], password [email or web address] 03/08/2013, 2:12 PM  LOS: 8 days             

## 2013-03-08 NOTE — Clinical Social Work Note (Signed)
Clinical Social Worker continuing to follow for support and discharge planning needs.  Patient remains agreeable to placement at [facility] and facility has extended the bed offer.  Patient is prepared to discharge tomorrow if medically appropriate.  Patient plans to have family/friends bring him his clothes and belongings for placement.  CSW to facilitate patient discharge needs once medically ready.  CSW available for support as needed.  Anthony [name], [place] [phone]

## 2013-03-08 NOTE — Consult Note (Signed)
Reason for Consult: Anemia and Heme positive stool Referring Physician: [facility]  Dillard Cannon HPI: This is a 77 year old male with a PMH of COPD, Sickle Cell anemia, and PEs who is admitted with abdominal pain.  Upon initial presentation it was uncertain if his pain was secondary to a Sickle Cell crisis versus acute pancreatitis.  His lipase was mildly elevated, but his CT scan was negative for any evidence of pancreatitis.  Over the course of his hospitalization his symptoms have improved, however, he was noted to have a worsening anemia.  Further evaluation revealed that he was heme positive.  In 2009 he underwent an EGD/Colonoscopy by Dr. Ewing Schlein with findings of a polyp, but no other abnormalities.  At that time he underwent the procedures for heme positive stool and an anemia.  Past Medical History  Diagnosis Date  . COPD (chronic obstructive pulmonary disease)   . Sickle cell anemia   . Pulmonary embolism     History reviewed. No pertinent past surgical history.  Family History  Problem Relation Age of Onset  . Sickle cell anemia Mother     Social History:  reports that he has been smoking.  He does not have any smokeless tobacco history on file. He reports that he does not drink alcohol. His drug history is not on file.  Allergies: No Known Allergies  Medications:  Scheduled: . albuterol  2.5 mg Nebulization Q4H  . feeding supplement  237 mL Oral BID BM  . ferrous sulfate  325 mg Oral BID WC  . fluticasone  2 spray Each Nare Daily  . ipratropium  0.5 mg Nebulization Q4H  . pantoprazole  40 mg Oral Q1200  . senna-docusate  1 tablet Oral BID  . sodium chloride  3 mL Intravenous Q12H  . tamsulosin  0.4 mg Oral QPC supper  . warfarin  10 mg Oral ONCE-1800  . Warfarin - Pharmacist Dosing Inpatient   Does not apply q1800   Continuous:   Results for orders placed during the hospital encounter of 02/28/13 (from the past 24 hour(s))  GLUCOSE, CAPILLARY     Status:  Abnormal   Collection Time    03/07/13  4:37 PM      Result Value Range   Glucose-Capillary 132 (*) 70 - 99 mg/dL   Comment 1 Documented in Chart     Comment 2 Notify RN    OCCULT BLOOD X 1 CARD TO LAB, STOOL     Status: Abnormal   Collection Time    03/07/13 11:15 PM      Result Value Range   Fecal Occult Bld POSITIVE (*) NEGATIVE  CBC     Status: Abnormal   Collection Time    03/08/13  4:30 AM      Result Value Range   WBC 3.9 (*) 4.0 - 10.5 K/uL   RBC 3.76 (*) 4.22 - 5.81 MIL/uL   Hemoglobin 8.0 (*) 13.0 - 17.0 g/dL   HCT 16.1 (*) 09.6 - 04.5 %   MCV 64.1 (*) 78.0 - 100.0 fL   MCH 21.3 (*) 26.0 - 34.0 pg   MCHC 33.2  30.0 - 36.0 g/dL   RDW 40.9 (*) 81.1 - 91.4 %   Platelets 170  150 - 400 K/uL  PROTIME-INR     Status: Abnormal   Collection Time    03/08/13  4:30 AM      Result Value Range   Prothrombin Time 20.9 (*) 11.6 - 15.2 seconds  INR 1.88 (*) 0.00 - 1.49     No results found.  ROS:  As stated above in the HPI otherwise negative.  Blood pressure 123/67, pulse 101, temperature 98.7 F (37.1 C), temperature source Oral, resp. rate 18, height 5\' 10"  (1.778 m), weight 115 lb 8.3 oz (52.4 kg), SpO2 100.00%.    PE: Gen: NAD, Alert and Oriented HEENT:  [place]/AT, EOMI Neck: Supple, no LAD Lungs: CTA Bilaterally CV: RRR without M/G/R ABM: Soft, NTND, +BS Ext: No C/C/E  Assessment/Plan: 1) Anemia. 2) Heme positive stool. 3) Sickle Cell Anemia. 4) History of PEs requiring chronic coumadin.   With the current findings I think it is prudent to reevaluate the patient with an EGD/Colonoscopy.  His INR is at 1.8, which is acceptable for the procedure.  Plan: 1) EGD/Colonoscopy tomorrow.  Miku Udall D 03/08/2013, 2:03 PM

## 2013-03-09 ENCOUNTER — Encounter (HOSPITAL_COMMUNITY): Payer: Self-pay | Admitting: Gastroenterology

## 2013-03-09 ENCOUNTER — Encounter (HOSPITAL_COMMUNITY)
Admission: EM | Disposition: A | Discharge status: Skilled Nursing Facility | Payer: Self-pay | Source: Home / Self Care | Attending: Internal Medicine

## 2013-03-09 HISTORY — PX: COLONOSCOPY: SHX5424

## 2013-03-09 HISTORY — PX: ESOPHAGOGASTRODUODENOSCOPY: SHX5428

## 2013-03-09 LAB — PROTIME-INR
INR: 1.76 — ABNORMAL HIGH (ref 0.00–1.49)
Prothrombin Time: 19.9 seconds — ABNORMAL HIGH (ref 11.6–15.2)

## 2013-03-09 SURGERY — COLONOSCOPY
Anesthesia: Moderate Sedation

## 2013-03-09 MED ORDER — FENTANYL CITRATE 0.05 MG/ML IJ SOLN
INTRAMUSCULAR | Status: AC
  Filled 2013-03-09: qty 2

## 2013-03-09 MED ORDER — MIDAZOLAM HCL 5 MG/ML IJ SOLN
INTRAMUSCULAR | Status: AC
  Filled 2013-03-09: qty 1

## 2013-03-09 MED ORDER — FERROUS SULFATE 325 (65 FE) MG PO TABS: 325.0000 mg | ORAL_TABLET | Freq: Two times a day (BID) | ORAL | Status: DC

## 2013-03-09 MED ORDER — PANTOPRAZOLE SODIUM 40 MG PO TBEC: 40.0000 mg | DELAYED_RELEASE_TABLET | Freq: Every day | ORAL | Status: AC

## 2013-03-09 MED ORDER — MIDAZOLAM HCL 5 MG/5ML IJ SOLN
INTRAMUSCULAR | Status: DC | PRN
  Administered 2013-03-09: 2 mg via INTRAVENOUS

## 2013-03-09 MED ORDER — HYDROCODONE-ACETAMINOPHEN 5-500 MG PO TABS: 1.0000 | ORAL_TABLET | Freq: Four times a day (QID) | ORAL | Status: DC | PRN

## 2013-03-09 MED ORDER — FENTANYL CITRATE 0.05 MG/ML IJ SOLN
INTRAMUSCULAR | Status: DC | PRN
  Administered 2013-03-09 (×2): 25 ug via INTRAVENOUS

## 2013-03-09 MED ORDER — MIDAZOLAM HCL 10 MG/2ML IJ SOLN
INTRAMUSCULAR | Status: DC | PRN
  Administered 2013-03-09 (×2): 1 mg via INTRAVENOUS

## 2013-03-09 MED ORDER — SODIUM CHLORIDE 0.9 % IJ SOLN
INTRAMUSCULAR | Status: DC | PRN
  Administered 2013-03-09: 2 mL via INTRAVENOUS

## 2013-03-09 MED ORDER — ACETAMINOPHEN 325 MG PO TABS: 650.0000 mg | ORAL_TABLET | Freq: Four times a day (QID) | ORAL | Status: DC | PRN

## 2013-03-09 NOTE — Progress Notes (Signed)
Attempted to see pt. Pt eating lunch and getting ready for D/C. States he has phone calls to make And things to do before he leaves and does not want therapy at this time. Tory Emerald, [place] [phone]

## 2013-03-09 NOTE — Progress Notes (Signed)
ANTICOAGULATION CONSULT NOTE - Follow Up Consult  Pharmacy Consult:  Coumadin Indication:  History of PE  No Known Allergies  Patient Measurements: Height: 5\' 10"  (177.8 cm) Weight: 115 lb 15.4 oz (52.6 kg) IBW/kg (Calculated) : 73 Heparin Dosing Weight: 51 kg  Vital Signs: Temp: 97.2 F (36.2 C) (05/09 0956) Temp src: Oral (05/09 0733) BP: 159/61 mmHg (05/09 0956) Pulse Rate: 93 (05/09 0733)  Labs:  Recent Labs  03/07/13 0500 03/08/13 0430 03/09/13 0515  HGB 8.4* 8.0*  --   HCT 25.0* 24.1*  --   PLT 153 170  --   LABPROT 19.2* 20.9* 19.9*  INR 1.68* 1.88* 1.76*  HEPARINUNFRC 0.67  --   --     Estimated Creatinine Clearance: 46.6 ml/min (by C-G formula based on Cr of 1.02).  Assessment: 85 YOM with history of sickle cell anemia admitted with abdominal pain- pancreatitis ruled out, suspected SSD crisis. Patient has had worsening anemia this admit, s/p 2 units PRBC 5/3. Noted FOBT positive 5/4 and 5/7. Patient was on Coumadin 10mg  daily PTA for hx of PE. Pharmacy has been managing anticoagulation while admitted. Noted Coumadin last PM cancelled per MD d/t plans for EGD/colonoscopy today (with INR<1.8). INR is sub-therapeutic (1.76) today, H/H stable as of 5/8, plts ok.  Noted EGD report shows two slowly bleeding AVMs, and GI MD recommends Coumadin avoidance.  Goal of Therapy:  INR 2 - 3 Monitor platelets by anticoagulation protocol: Yes   Plan:  - Will hold off on Coumadin orders for now, will await attending MD orders  Thanks, Jamisha Hoeschen K. Allena Katz, PharmD, BCPS.  Clinical Pharmacist Pager [phone]. 03/09/2013 10:04 AM

## 2013-03-09 NOTE — Op Note (Addendum)
[name] [address] [place], [address]   OPERATIVE PROCEDURE REPORT  PATIENT: Anthony [name], Anthony [name]  MR#: [id number] BIRTHDATE: [birth year]  GENDER: Male ENDOSCOPIST: Jeani Hawking, MD ASSISTANT: PROCEDURE DATE: 03/09/2013 PROCEDURE:   EGD w/ control of bleeding ASA CLASS:   Class III INDICATIONS:Heme positive stool and anemia. MEDICATIONS: Fentanyl 50 mcg IV and Versed 5 mg IV TOPICAL ANESTHETIC:   none  DESCRIPTION OF PROCEDURE:   After the risks benefits and alternatives of the procedure were thoroughly explained, informed consent was obtained.  The Pentax Gastroscope [id number]  endoscope was introduced through the mouth  and advanced to the second portion of the duodenum Without limitations.      The instrument was slowly withdrawn as the mucosa was fully examined.      FINDINGS: intubation of the esophagus was difficult with the adult EGD scope.  The patient was unwilling to swallow the scope. Changing tot he pediatric EGD scope resulted in easy intubation of the esophagus, which was normal.  In the distal gastric body a couple of slowly oozing AVMs were identified.  These two sites were ablated, however, with his mildly elevated INR at 1.7 he still had some residual bleeding.  A gastritis was also noted and cold biopsies were obtained.  No duodenal abnormalities..   Retroflexed views revealed no abnormalities.     The scope was then withdrawn from the patient and the procedure terminated.  COMPLICATIONS: There were no complications. IMPRESSION: 1) Two slowly bleedingdistal gastric AVMs. 2) Gastritis.  RECOMMENDATIONS: 1) Avoid coumadin. 2) Follow up biopsies.   _______________________________ eSignedJeani Hawking, MD 03/09/2013 9:28 AM

## 2013-03-09 NOTE — Discharge Summary (Signed)
Physician Discharge Summary  Anthony [name] ZOX:[id number] DOB: 09-Oct-1936 DOA: 02/28/2013  PCP: Dorrene German, MD  Admit date: 02/28/2013 Discharge date: 03/09/2013  Time spent: 50 minutes  Recommendations for Outpatient Follow-up:  1. CBC - 03/12/13  2. No coumadin until hemoglobin stable for 2 consecutive blood draws.   Discharge Diagnoses:   Abdominal pain - multifactorial - acute pancreatitis , sickle cell ds - resolved  Acute blood loss anemia from gastric AVMs s/p laser treatment    SICKLE CELL ANEMIA   COPD   PULMONARY EMBOLISM, HX OF   Discharge Condition: fair  Diet recommendation: heart heathy   Filed Weights   03/07/13 0433 03/08/13 0503 03/09/13 0441  Weight: 52.164 kg (115 lb) 52.4 kg (115 lb 8.3 oz) 52.6 kg (115 lb 15.4 oz)    History of present illness:  Anthony [name] is a 77 y.o. male with history of COPD, pulmonary embolism, sickle cell anemia presented to the ER because of abdominal pain. Patient has been having abdominal pain for last 3-4 days. The pain is mostly on the periumbilical area constant. There was no associated nausea vomiting or diarrhea. Denies any fever chills. In the ER patient also was found to be mildly short of breath with wheezing and was placed on nebulizer. Patient's labs show increased lipase concerning for pancreatitis. Chest x-ray does not show the acute and CT abdomen and pelvis is pending. Patient has been admitted for further management.   Hospital Course:  Abdominal pain most likely secondary to pancreatitis - patient was kept NPO initially. We did advance diet as tolerated. Patient states he drinks only alcohol once a week and last drink was more than a week ago. His LFTs are normal at this time. triglyceride levels are ok  By 03/07/13 patient said that pain was getting better . Ct abdomen and pelvis without findings of severe necrotizing pancreatitis.  Patient with epigastric pain and hemoccult positive stool. He has a personal  history of PUD. EGD and colonoscopy. 5/9 negative for ulcer - positive for AVMs - treated with APC - plan to stop coumadin for now until CBC reveals stable HB for 2 consecutive checks.   COPD -advanced with chronic respiratory failure on exam he was not wheezing. Continued with bronchodilators and oxygen  Anemia-multifactorial from subacute GI bleed, sickle cell crisis - transfused 2 units 5/3- patient feeling better. Monitor as outpt   History of PE on Coumadin - coumadin- resumed. Was on heparin bridge until 5/7. Plan to hold coumadin fro GI  procedure 03/08/13 we did have to hold coumadin since he has bleeding AVMs -  May resume coumadin as outpt once CBCs stable   Sickle cell anemia - follow CBC closely.  Tobacco abuse - advised patient to quit smoking      Procedures: EGD Colonoscopy   Consultations:  Elnoria Howard - GI  Discharge Exam: Filed Vitals:   03/09/13 0910 03/09/13 0920 03/09/13 0930 03/09/13 0956  BP: 130/66 134/65 135/64 159/61  Pulse:      Temp:    97.2 F (36.2 C)  TempSrc:      Resp: 19 21 20 20   Height:      Weight:      SpO2: 99% 93% 100% 100%    General: axox3 Cardiovascular: rrr Respiratory: ctab  Discharge Instructions  Discharge Orders   Future Orders Complete By Expires     Diet - low sodium heart healthy  As directed     Increase activity slowly  As directed  Medication List    STOP taking these medications       albuterol (2.5 MG/3ML) 0.083% nebulizer solution  Commonly known as:  PROVENTIL     warfarin 5 MG tablet  Commonly known as:  COUMADIN      TAKE these medications       acetaminophen 325 MG tablet  Commonly known as:  TYLENOL  Take 2 tablets (650 mg total) by mouth every 6 (six) hours as needed.     albuterol-ipratropium 18-103 MCG/ACT inhaler  Commonly known as:  COMBIVENT  Inhale 2 puffs into the lungs 4 (four) times daily.     ENSURE  Take 237 mLs by mouth 3 (three) times daily between meals.     ferrous  sulfate 325 (65 FE) MG tablet  Take 1 tablet (325 mg total) by mouth 2 (two) times daily with a meal.     fluticasone 50 MCG/ACT nasal spray  Commonly known as:  FLONASE  Place 2 sprays into the nose daily.     HYDROcodone-acetaminophen 5-500 MG per tablet  Commonly known as:  VICODIN  Take 1 tablet by mouth every 6 (six) hours as needed for pain.     pantoprazole 40 MG tablet  Commonly known as:  PROTONIX  Take 1 tablet (40 mg total) by mouth daily at 12 noon.     tiotropium 18 MCG inhalation capsule  Commonly known as:  SPIRIVA  Place 18 mcg into inhaler and inhale daily.     Vitamin D (Ergocalciferol) 50000 UNITS Caps  Commonly known as:  DRISDOL  Take 50,000 Units by mouth every 7 (seven) days. On mondays       No Known Allergies    The results of significant diagnostics from this hospitalization (including imaging, microbiology, ancillary and laboratory) are listed below for reference.    Significant Diagnostic Studies: Dg Chest 2 View  03/01/2013  *RADIOLOGY REPORT*  Clinical Data: Shortness of breath.  COPD.  Emphysema.  Sickle cell.  CHEST - 2 VIEW  Comparison: 12/28/2012  Findings: Emphysematous changes in the lungs with scattered fibrosis.  Chronic blunting of the right costophrenic angle suggesting thickened pleura.  This is stable since previous study. Heart size and pulmonary vascularity are normal.  No focal airspace consolidation in the lungs.  No pneumothorax.  Mediastinal contours appear intact.  Calcification of the aorta.  Degenerative changes in the thoracic spine.  IMPRESSION: Stable chronic emphysematous changes and fibrosis in the lungs with chronic blunting of the right costophrenic angle suggesting thickened pleura.  No evidence of active pulmonary disease.   Original Report Authenticated By: Burman Nieves, M.D.    Ct Abdomen Pelvis W Contrast  03/01/2013  *RADIOLOGY REPORT*  Clinical Data: Abdominal pain for 4 days.  Headache and hypertension.  Lower  abdominal pain.  History of sickle cell.  CT ABDOMEN AND PELVIS WITH CONTRAST  Technique:  Multidetector CT imaging of the abdomen and pelvis was performed following the standard protocol during bolus administration of intravenous contrast.  Contrast: OMNIPAQUE IOHEXOL 300 MG/ML  SOLN  Comparison: 08/28/2003  Findings: Emphysematous changes in the lung bases.  Fibrosis or atelectasis in the lung bases.  Focal calcifications in the liver and spleen.  Tiny sub-centimeter low attenuation changes in the liver most likely represent small cysts.  The gallbladder, pancreas, adrenal glands, inferior vena cava, and retroperitoneal lymph nodes are unremarkable. Calcification of the aorta without aneurysm.  Multi focal scarring in the kidneys.  No hydronephrosis.  Nephrograms are somewhat heterogeneous.  This could be due to small vessel disease or infection.  Consider pyelonephritis.  The stomach, small bowel, and colon are unremarkable.  No bowel wall thickening or distension. No free air or free fluid in the abdomen.  Pelvis:  Prostate gland is somewhat enlarged.  Bladder wall is not thickened but the bladder is mildly distended.  The appendix is normal.  No evidence of diverticulitis.  No significant pelvic lymphadenopathy.  No free or loculated pelvic fluid collections. Heterogeneous mineralization of the bones with areas of sclerosis and lucency and multiple vertebrae, in the pelvis, and in the hips. This is likely to represent sickle cell change although metastatic disease is not excluded.  Correlation with PSA is recommended.  IMPRESSION: Prostatic enlargement.  Bladder distension without wall thickening. Multi focal scarring and heterogeneous nephrograms in the kidneys. Changes may represent small vessel disease, reflux, or infection. Heterogeneous sclerosis and lucency in the bones may represent sickle cell changes with bone infarcts.  Bone metastasis not excluded.  Correlation with PSA is recommended.   Atelectasis in the lung bases.   Original Report Authenticated By: Burman Nieves, M.D.    Dg Chest Port 1 View  03/06/2013  *RADIOLOGY REPORT*  Clinical Data: Shortness of breath.  PORTABLE CHEST - 1 VIEW  Comparison: 03/01/2013 and 12/28/2012.  Findings: 1045 hours.  The heart size and mediastinal contours are stable.  There is stable chronic blunting of the costophrenic angles, right greater than left.  There is stable hyperinflation of the lungs with mild basilar scarring.  No superimposed airspace disease, edema or significant pleural effusion is identified.  IMPRESSION: Stable chronic lung disease with costophrenic angle blunting.  No acute findings identified.   Original Report Authenticated By: Carey Bullocks, M.D.     Microbiology: No results found for this or any previous visit (from the past 240 hour(s)).   Labs: Basic Metabolic Panel:  Recent Labs Lab 03/03/13 0455 03/05/13 0454  NA 133* 137  K 3.9 4.2  CL 97 98  CO2 32 34*  GLUCOSE 92 109*  BUN 11 11  CREATININE 1.01 1.02  CALCIUM 8.6 8.8   Liver Function Tests: No results found for this basename: AST, ALT, ALKPHOS, BILITOT, PROT, ALBUMIN,  in the last 168 hours No results found for this basename: LIPASE, AMYLASE,  in the last 168 hours No results found for this basename: AMMONIA,  in the last 168 hours CBC:  Recent Labs Lab 03/04/13 0559 03/05/13 0454 03/06/13 0601 03/07/13 0500 03/08/13 0430  WBC 5.0 5.1 4.9 4.5 3.9*  HGB 9.4* 8.9* 8.9* 8.4* 8.0*  HCT 27.4* 26.0* 26.4* 25.0* 24.1*  MCV 62.0* 62.4* 63.3* 64.1* 64.1*  PLT 173 157 168 153 170   Cardiac Enzymes: No results found for this basename: CKTOTAL, CKMB, CKMBINDEX, TROPONINI,  in the last 168 hours BNP: BNP (last 3 results) No results found for this basename: PROBNP,  in the last 8760 hours CBG:  Recent Labs Lab 03/05/13 1110 03/05/13 1650 03/05/13 2046 03/06/13 0610 03/07/13 1637  GLUCAP 137* 103* 100* 85 132*        Signed:  Hisham Provence  [facility] 03/09/2013, 11:21 AM

## 2013-03-09 NOTE — H&P (View-Only) (Signed)
[facility] PROGRESS NOTE  Anthony [name] ZOX:[id number] DOB: 1936-01-04 DOA: 02/28/2013 PCP: Anthony German, MD  Assessment/Plan: Abdominal pain most likely secondary to pancreatitis - patient was kept NPO initially. We did advance diet as tolerated. Patient states he drinks only alcohol once a week and last drink was more than a week ago. His LFTs are normal at this time. triglyceride levels are ok By 03/07/13 patient said that pain was getting better . Ct abdomen and pelvis without findings of severe necrotizing pancreatitis.  Patient with epigastric pain and hemoccult positive stool. He has a personal history of PUD. Dw/ Anthony [name] - plan for EGD and colonoscopy. 5/9.    COPD -advanced with chronic respiratory failure  on exam he was not wheezing. Continue with nebulizers. And oxygen   Anemia- no sign of GI bleeding- ?sickle cell crisis vs chronic Gi losses -  transfused 2 units 5/3- patient feeling better.    History of PE on Coumadin - coumadin- resumed. Was on heparin bridge until 5/7. Plan to hold coumadin fro GI procedure 03/08/13   Sickle cell anemia - follow CBC closely.   Tobacco abuse - advised patient to quit smoking   Code Status: full Family Communication: patient Disposition Plan: snf       HPI/Subjective: Abdominal pain is slightly better   Objective: Filed Vitals:   03/08/13 0435 03/08/13 0503 03/08/13 0924 03/08/13 1239  BP:  123/67    Pulse:  95 97 101  Temp:  98.7 F (37.1 C)    TempSrc:  Oral    Resp:  19 20 18   Height:      Weight:  52.4 kg (115 lb 8.3 oz)    SpO2: 100% 100% 98% 100%   Patient Vitals for the past 24 hrs:  BP Temp Temp src Pulse Resp SpO2 Weight  03/08/13 1239 - - - 101 18 100 % -  03/08/13 0924 - - - 97 20 98 % -  03/08/13 0503 123/67 mmHg 98.7 F (37.1 C) Oral 95 19 100 % 52.4 kg (115 lb 8.3 oz)  03/08/13 0435 - - - - - 100 % -  03/08/13 0029 - - - - - 96 % -  03/07/13 2115 125/53 mmHg 98.9 F (37.2 C) Oral 103 20  100 % -  03/07/13 1724 - - - - - 99 % -     Intake/Output Summary (Last 24 hours) at 03/08/13 1412 Last data filed at 03/08/13 1359  Gross per 24 hour  Intake    360 ml  Output   1450 ml  Net  -1090 ml   Filed Weights   03/06/13 0422 03/07/13 0433 03/08/13 0503  Weight: 51.483 kg (113 lb 8 oz) 52.164 kg (115 lb) 52.4 kg (115 lb 8.3 oz)    Exam:  General: cachectic   Cardiovascular: S1-S2 heard. , tachy  Respiratory: No rhonchi or crepitations, no wheezing Abdomen: , soft , non tender    Data Reviewed: Basic Metabolic Panel:  Recent Labs Lab 03/03/13 0455 03/05/13 0454  NA 133* 137  K 3.9 4.2  CL 97 98  CO2 32 34*  GLUCOSE 92 109*  BUN 11 11  CREATININE 1.01 1.02  CALCIUM 8.6 8.8   Liver Function Tests: No results found for this basename: AST, ALT, ALKPHOS, BILITOT, PROT, ALBUMIN,  in the last 168 hours No results found for this basename: LIPASE, AMYLASE,  in the last 168 hours No results found for this basename: AMMONIA,  in the last  168 hours CBC:  Recent Labs Lab 03/04/13 0559 03/05/13 0454 03/06/13 0601 03/07/13 0500 03/08/13 0430  WBC 5.0 5.1 4.9 4.5 3.9*  HGB 9.4* 8.9* 8.9* 8.4* 8.0*  HCT 27.4* 26.0* 26.4* 25.0* 24.1*  MCV 62.0* 62.4* 63.3* 64.1* 64.1*  PLT 173 157 168 153 170   Cardiac Enzymes: No results found for this basename: CKTOTAL, CKMB, CKMBINDEX, TROPONINI,  in the last 168 hours BNP (last 3 results) No results found for this basename: PROBNP,  in the last 8760 hours CBG:  Recent Labs Lab 03/05/13 1110 03/05/13 1650 03/05/13 2046 03/06/13 0610 03/07/13 1637  GLUCAP 137* 103* 100* 85 132*    No results found for this or any previous visit (from the past 240 hour(s)).   Studies: No results found.  Scheduled Meds: . albuterol  2.5 mg Nebulization Q4H  . feeding supplement  237 mL Oral BID BM  . ferrous sulfate  325 mg Oral BID WC  . fluticasone  2 spray Each Nare Daily  . ipratropium  0.5 mg Nebulization Q4H  .  pantoprazole  40 mg Oral Q1200  . senna-docusate  1 tablet Oral BID  . sodium chloride  3 mL Intravenous Q12H  . Warfarin - Pharmacist Dosing Inpatient   Does not apply q1800   Continuous Infusions:    Principal Problem:   Abdominal pain Active Problems:   SICKLE CELL ANEMIA   COPD   PULMONARY EMBOLISM, HX OF     Anthony [name]  [facility] Pager [phone] If 7PM-7AM, please contact night-coverage at [email or web address], password [facility] 03/08/2013, 2:12 PM  LOS: 8 days

## 2013-03-09 NOTE — Op Note (Signed)
[name] [address] [place], [address]   OPERATIVE PROCEDURE REPORT  PATIENT: Anthony [name], Anthony [name]  MR#: [id number] BIRTHDATE: 05-27-1936  GENDER: Male ENDOSCOPIST: Jeani Hawking, MD ASSISTANT:   Kandice Robinsons, technician Jimmey Ralph, RN, CGRN PROCEDURE DATE: 03/09/2013 PROCEDURE:   Colonoscopy with snare polypectomy ASA CLASS:   Class III INDICATIONS:Anemia and Heme positive stool. MEDICATIONS: See the EGD report  DESCRIPTION OF PROCEDURE:   After the risks benefits and alternatives of the procedure were thoroughly explained, informed consent was obtained.  A digital rectal exam revealed no abnormalities of the rectum.    The Pentax Adult Colon [id number] endoscope was introduced through the anus  and advanced to the cecum, which was identified by both the appendix and ileocecal valve , No adverse events experienced.    The quality of the prep was excellent. .  The instrument was then slowly withdrawn as the colon was fully examined.     FINDINGS: The EndoPro system shut down.  I was not able to obtain images of the polyp.  In the Ascending colon a laerge 1.5-2 cm sessile polyp was identified.  The lesion was lifted with 5 ml of normal saline submucosally and then removed with a hot snare with Endocut III 150/15.  Since the patient requires coumading three hemoclips were successfully deployed and closed the polypectomy site.  A 3 mm sessile ascending colon polyp was removed with a cold snare.  Scattered diverticula were identified throughout the colon. No other abnormalities noted.   Retroflexed views revealed internal/external hemorrhoids.     The scope was then withdrawn from the patient and the procedure terminated.  COMPLICATIONS: There were no complications.  IMPRESSION: 1) Polyps. 2) Diverticula. 3) Int/Ext hemorrhoids.  RECOMMENDATIONS: 1.  Await biopsy results 2.  Repeat the colonoscopy in 3-5 years. 3.  Avoid coumadin if  possible. 4.  Follow HGB and transfuse if necessary.   _______________________________ eSignedJeani Hawking, MD 03/09/2013 10:46 AM

## 2013-03-09 NOTE — Progress Notes (Signed)
EMS arrived to pick up pt. IV and tele removed. D/C instructions placed in packet for SNF. Report called to [facility]. Pt packet and belongings sent with EMS.

## 2013-03-09 NOTE — Interval H&P Note (Signed)
History and Physical Interval Note:  03/09/2013 8:03 AM  Anthony [name]  has presented today for surgery, with the diagnosis of Anemia and Heme positive stool  The various methods of treatment have been discussed with the patient and family. After consideration of risks, benefits and other options for treatment, the patient has consented to  Procedure(s): COLONOSCOPY (N/A) ESOPHAGOGASTRODUODENOSCOPY (EGD) (N/A) as a surgical intervention .  The patient's history has been reviewed, patient examined, no change in status, stable for surgery.  I have reviewed the patient's chart and labs.  Questions were answered to the patient's satisfaction.     [name]

## 2013-03-09 NOTE — Clinical Social Work Note (Signed)
Clinical Social Worker facilitated patient discharge including confirming patient discharge plans with patient and facility.  Patient has made arrangements with a friend to get his belongings from home and bring them to the facility.  CSW arranged ambulance transport via [facility] to [facility].  RN to call report prior to discharge.    Clinical Social Worker will sign off for now as social work intervention is no longer needed. Please consult [place] again if new need arises.  Macario Golds, [place] [phone]

## 2013-03-12 ENCOUNTER — Encounter (HOSPITAL_COMMUNITY): Payer: Self-pay | Admitting: Gastroenterology

## 2014-05-02 IMAGING — CR DG CHEST 1V PORT
1 series · 1 of 1 positions shown · non-contrast
Comparison: 03/01/2013 and 12/28/2012.

CLINICAL DATA: Shortness of breath.

PORTABLE CHEST - 1 VIEW

[view not recorded]
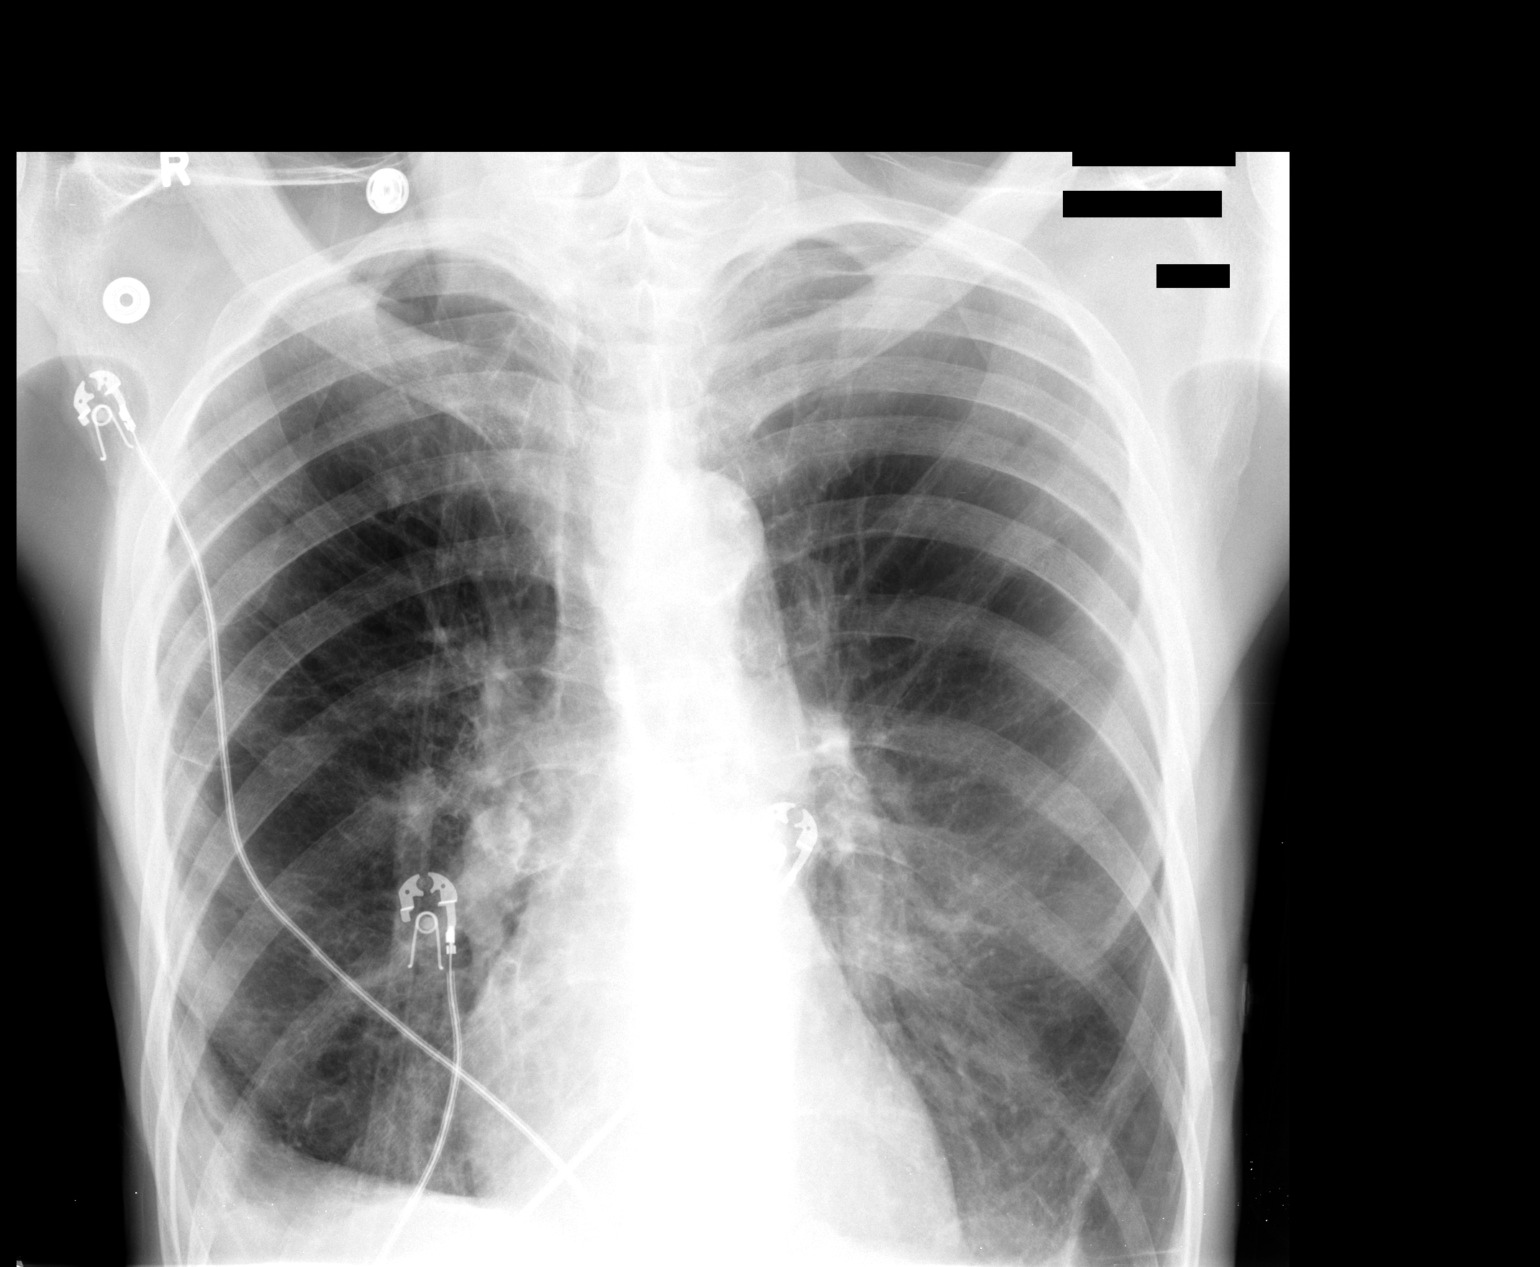

[1 of 1 positions shown; findings below may reference images not displayed]

FINDINGS: 4335 hours.  The heart size and mediastinal contours are
stable.  There is stable chronic blunting of the costophrenic
angles, right greater than left.  There is stable hyperinflation of
the lungs with mild basilar scarring.  No superimposed airspace
disease, edema or significant pleural effusion is identified.
IMPRESSION: Stable chronic lung disease with costophrenic angle blunting.  No
acute findings identified.

## 2014-06-04 ENCOUNTER — Emergency Department (HOSPITAL_COMMUNITY): Payer: PRIVATE HEALTH INSURANCE

## 2014-06-04 ENCOUNTER — Encounter (HOSPITAL_COMMUNITY): Payer: Self-pay | Admitting: Emergency Medicine

## 2014-06-04 ENCOUNTER — Inpatient Hospital Stay (HOSPITAL_COMMUNITY)
Admission: EM | Admit: 2014-06-04 | Discharge: 2014-06-10 | DRG: 640 | Disposition: A | Discharge status: Skilled Nursing Facility | Payer: PRIVATE HEALTH INSURANCE | Attending: Family Medicine | Admitting: Family Medicine

## 2014-06-04 DIAGNOSIS — I2789 Other specified pulmonary heart diseases: Secondary | ICD-10-CM

## 2014-06-04 DIAGNOSIS — Z86718 Personal history of other venous thrombosis and embolism: Secondary | ICD-10-CM

## 2014-06-04 DIAGNOSIS — Z79899 Other long term (current) drug therapy: Secondary | ICD-10-CM

## 2014-06-04 DIAGNOSIS — N182 Chronic kidney disease, stage 2 (mild): Secondary | ICD-10-CM | POA: Diagnosis present

## 2014-06-04 DIAGNOSIS — Z681 Body mass index (BMI) 19 or less, adult: Secondary | ICD-10-CM

## 2014-06-04 DIAGNOSIS — E162 Hypoglycemia, unspecified: Secondary | ICD-10-CM | POA: Diagnosis present

## 2014-06-04 DIAGNOSIS — R5383 Other fatigue: Secondary | ICD-10-CM | POA: Diagnosis not present

## 2014-06-04 DIAGNOSIS — E876 Hypokalemia: Secondary | ICD-10-CM

## 2014-06-04 DIAGNOSIS — R5381 Other malaise: Secondary | ICD-10-CM | POA: Diagnosis not present

## 2014-06-04 DIAGNOSIS — J441 Chronic obstructive pulmonary disease with (acute) exacerbation: Secondary | ICD-10-CM | POA: Diagnosis present

## 2014-06-04 DIAGNOSIS — D649 Anemia, unspecified: Secondary | ICD-10-CM | POA: Diagnosis present

## 2014-06-04 DIAGNOSIS — I959 Hypotension, unspecified: Secondary | ICD-10-CM

## 2014-06-04 DIAGNOSIS — J449 Chronic obstructive pulmonary disease, unspecified: Secondary | ICD-10-CM | POA: Diagnosis present

## 2014-06-04 DIAGNOSIS — E871 Hypo-osmolality and hyponatremia: Principal | ICD-10-CM | POA: Diagnosis present

## 2014-06-04 DIAGNOSIS — F17200 Nicotine dependence, unspecified, uncomplicated: Secondary | ICD-10-CM | POA: Diagnosis present

## 2014-06-04 DIAGNOSIS — D62 Acute posthemorrhagic anemia: Secondary | ICD-10-CM | POA: Diagnosis present

## 2014-06-04 DIAGNOSIS — J438 Other emphysema: Secondary | ICD-10-CM

## 2014-06-04 DIAGNOSIS — Z86711 Personal history of pulmonary embolism: Secondary | ICD-10-CM

## 2014-06-04 DIAGNOSIS — J309 Allergic rhinitis, unspecified: Secondary | ICD-10-CM

## 2014-06-04 DIAGNOSIS — J4489 Other specified chronic obstructive pulmonary disease: Secondary | ICD-10-CM | POA: Diagnosis present

## 2014-06-04 DIAGNOSIS — N179 Acute kidney failure, unspecified: Secondary | ICD-10-CM | POA: Diagnosis present

## 2014-06-04 DIAGNOSIS — D571 Sickle-cell disease without crisis: Secondary | ICD-10-CM | POA: Diagnosis present

## 2014-06-04 DIAGNOSIS — E43 Unspecified severe protein-calorie malnutrition: Secondary | ICD-10-CM | POA: Diagnosis present

## 2014-06-04 DIAGNOSIS — D500 Iron deficiency anemia secondary to blood loss (chronic): Secondary | ICD-10-CM | POA: Diagnosis present

## 2014-06-04 LAB — COMPREHENSIVE METABOLIC PANEL
ALBUMIN: 3.2 g/dL — AB (ref 3.5–5.2)
ALT: 11 U/L (ref 0–53)
AST: 59 U/L — ABNORMAL HIGH (ref 0–37)
Alkaline Phosphatase: 69 U/L (ref 39–117)
Anion gap: 15 (ref 5–15)
BUN: 11 mg/dL (ref 6–23)
CO2: 26 mEq/L (ref 19–32)
CREATININE: 1.91 mg/dL — AB (ref 0.50–1.35)
Calcium: 9.2 mg/dL (ref 8.4–10.5)
Chloride: 86 mEq/L — ABNORMAL LOW (ref 96–112)
GFR calc Af Amer: 38 mL/min — ABNORMAL LOW (ref >90)
GFR, EST NON AFRICAN AMERICAN: 32 mL/min — AB (ref >90)
Glucose, Bld: 47 mg/dL — ABNORMAL LOW (ref 70–99)
Potassium: 4.4 mEq/L (ref 3.7–5.3)
Sodium: 127 mEq/L — ABNORMAL LOW (ref 137–147)
TOTAL PROTEIN: 7.1 g/dL (ref 6.0–8.3)
Total Bilirubin: 1 mg/dL (ref 0.3–1.2)

## 2014-06-04 LAB — CBG MONITORING, ED
GLUCOSE-CAPILLARY: 414 mg/dL — AB (ref 70–99)
Glucose-Capillary: 13 mg/dL — CL (ref 70–99)
Glucose-Capillary: 26 mg/dL — CL (ref 70–99)
Glucose-Capillary: 43 mg/dL — CL (ref 70–99)
Glucose-Capillary: 308 mg/dL — ABNORMAL HIGH (ref 70–99)

## 2014-06-04 LAB — I-STAT CHEM 8, ED
BUN: 11 mg/dL (ref 6–23)
Calcium, Ion: 1.18 mmol/L (ref 1.13–1.30)
Chloride: 85 mEq/L — ABNORMAL LOW (ref 96–112)
Creatinine, Ser: 1.7 mg/dL — ABNORMAL HIGH (ref 0.50–1.35)
GLUCOSE: 344 mg/dL — AB (ref 70–99)
HEMATOCRIT: 26 % — AB (ref 39.0–52.0)
HEMOGLOBIN: 8.8 g/dL — AB (ref 13.0–17.0)
POTASSIUM: 4.5 meq/L (ref 3.7–5.3)
Sodium: 121 mEq/L — CL (ref 137–147)
TCO2: 24 mmol/L (ref 0–100)

## 2014-06-04 LAB — URINALYSIS, ROUTINE W REFLEX MICROSCOPIC
GLUCOSE, UA: 250 mg/dL — AB
Ketones, ur: 15 mg/dL — AB
Nitrite: NEGATIVE
PH: 5.5 (ref 5.0–8.0)
PROTEIN: 30 mg/dL — AB
SPECIFIC GRAVITY, URINE: 1.013 (ref 1.005–1.030)
Urobilinogen, UA: 1 mg/dL (ref 0.0–1.0)

## 2014-06-04 LAB — URINE MICROSCOPIC-ADD ON

## 2014-06-04 LAB — BASIC METABOLIC PANEL
Anion gap: 16 — ABNORMAL HIGH (ref 5–15)
BUN: 12 mg/dL (ref 6–23)
CALCIUM: 7.9 mg/dL — AB (ref 8.4–10.5)
CO2: 19 mEq/L (ref 19–32)
Chloride: 82 mEq/L — ABNORMAL LOW (ref 96–112)
Creatinine, Ser: 1.63 mg/dL — ABNORMAL HIGH (ref 0.50–1.35)
GFR calc Af Amer: 46 mL/min — ABNORMAL LOW (ref >90)
GFR calc non Af Amer: 39 mL/min — ABNORMAL LOW (ref >90)
Glucose, Bld: 374 mg/dL — ABNORMAL HIGH (ref 70–99)
POTASSIUM: 4.7 meq/L (ref 3.7–5.3)
Sodium: 117 mEq/L — CL (ref 137–147)

## 2014-06-04 LAB — CBC WITH DIFFERENTIAL/PLATELET
BASOS PCT: 1 % (ref 0–1)
Basophils Absolute: 0 10*3/uL (ref 0.0–0.1)
EOS ABS: 0.2 10*3/uL (ref 0.0–0.7)
EOS PCT: 5 % (ref 0–5)
HEMATOCRIT: 24.8 % — AB (ref 39.0–52.0)
Hemoglobin: 8.4 g/dL — ABNORMAL LOW (ref 13.0–17.0)
LYMPHS PCT: 28 % (ref 12–46)
Lymphs Abs: 1.2 10*3/uL (ref 0.7–4.0)
MCH: 23.8 pg — ABNORMAL LOW (ref 26.0–34.0)
MCHC: 33.9 g/dL (ref 30.0–36.0)
MCV: 70.3 fL — AB (ref 78.0–100.0)
MONO ABS: 0.4 10*3/uL (ref 0.1–1.0)
Monocytes Relative: 9 % (ref 3–12)
Neutro Abs: 2.5 10*3/uL (ref 1.7–7.7)
Neutrophils Relative %: 57 % (ref 43–77)
Platelets: 132 10*3/uL — ABNORMAL LOW (ref 150–400)
RBC: 3.53 MIL/uL — ABNORMAL LOW (ref 4.22–5.81)
RDW: 19.6 % — AB (ref 11.5–15.5)
WBC: 4.2 10*3/uL (ref 4.0–10.5)

## 2014-06-04 LAB — RETICULOCYTES
RBC.: 3.53 MIL/uL — AB (ref 4.22–5.81)
RETIC CT PCT: 1 % (ref 0.4–3.1)
Retic Count, Absolute: 35.3 10*3/uL (ref 19.0–186.0)

## 2014-06-04 LAB — I-STAT TROPONIN, ED: Troponin i, poc: 0 ng/mL (ref 0.00–0.08)

## 2014-06-04 LAB — POC OCCULT BLOOD, ED: FECAL OCCULT BLD: NEGATIVE

## 2014-06-04 MED ORDER — ALBUTEROL SULFATE (2.5 MG/3ML) 0.083% IN NEBU
5.0000 mg | INHALATION_SOLUTION | Freq: Once | RESPIRATORY_TRACT | Status: AC
  Administered 2014-06-04: 5 mg via RESPIRATORY_TRACT
  Filled 2014-06-04: qty 6

## 2014-06-04 MED ORDER — DEXTROSE 50 % IV SOLN
INTRAVENOUS | Status: AC
  Filled 2014-06-04: qty 50

## 2014-06-04 MED ORDER — DEXTROSE 5 % IV SOLN
Freq: Once | INTRAVENOUS | Status: AC
  Administered 2014-06-04: 22:00:00 via INTRAVENOUS

## 2014-06-04 MED ORDER — DEXTROSE 10 % IV SOLN
INTRAVENOUS | Status: DC
  Filled 2014-06-04: qty 1000

## 2014-06-04 MED ORDER — DEXTROSE 5 % IV BOLUS
500.0000 mL | Freq: Once | INTRAVENOUS | Status: AC
  Administered 2014-06-04: 500 mL via INTRAVENOUS

## 2014-06-04 MED ORDER — DEXTROSE 50 % IV SOLN
1.0000 | Freq: Once | INTRAVENOUS | Status: AC
  Administered 2014-06-04: 50 mL via INTRAVENOUS

## 2014-06-04 MED ORDER — SODIUM CHLORIDE 0.9 % IV SOLN
1000.0000 mL | Freq: Once | INTRAVENOUS | Status: AC
  Administered 2014-06-04: 1000 mL via INTRAVENOUS

## 2014-06-04 MED ORDER — DEXTROSE 50 % IV SOLN
1.0000 | Freq: Once | INTRAVENOUS | Status: AC
  Administered 2014-06-04: 50 mL via INTRAVENOUS
  Filled 2014-06-04: qty 50

## 2014-06-04 NOTE — ED Notes (Signed)
Pt. Is not able to use the restroom at this time, but is aware that we need a urine specimen.

## 2014-06-04 NOTE — ED Notes (Signed)
Bed: WA01 Expected date:  Expected time:  Means of arrival:  Comments: EMS-not able to eat

## 2014-06-04 NOTE — ED Notes (Signed)
Per EMS, pt has not had an appetite for the past 3 days. Pt states he has only taken a few bites of food and had ~1 cup over the past 3 days. Pt complains of increased weakness, he states he cannot walk as much as he used to. Pt A&Ox4 in NAD.

## 2014-06-04 NOTE — ED Provider Notes (Signed)
Pt seen in conjunction with Doyce Loose, PA-C who is orienting with me.    Anthony [name] is a 78 y.o. male  with a hx of COPD, sickle cell disease presents to the Emergency Department complaining of gradual, persistent, progressively worsening early CAD, decreased appetite and generalized weakness onset several weeks ago. He reports he is eating almost nothing in the last 5 days. He reports that he feels hungry but eats no more than several bites.  He denies nausea, vomiting, diarrhea, syncope, dysuria, hematuria, fevers, cough, congestion.  He denies history of diabetes and reports he's not taken any of his medications in the last 5 days.  Patient reports that his weakness caused a fall several days ago but denies loss of consciousness. Patient is taking Coumadin.  Physical Exam  BP 116/68  Pulse 91  Temp(Src) 97.5 F (36.4 C) (Rectal)  Resp 18  SpO2 100%  Physical Exam  Nursing note and vitals reviewed. Constitutional: He appears well-developed.  Awake, alert, cachectic, ill appearing  HENT:  Head: Normocephalic and atraumatic.  Nose: Nose normal.  Mouth/Throat: Oropharynx is clear and moist. No oropharyngeal exudate.  Eyes: Conjunctivae are normal. No scleral icterus.  Neck: Normal range of motion. Neck supple.  Cardiovascular: Normal rate, regular rhythm, normal heart sounds and intact distal pulses.   No murmur heard. Pulmonary/Chest: Accessory muscle usage present. Tachypnea noted. No respiratory distress. He has decreased breath sounds. He has no wheezes. He exhibits no tenderness.  Decreased breath sounds throughout with tachypnea and accessory muscle usage; no focal wheezes, rhonchi or rales  Abdominal: Soft. Bowel sounds are normal. He exhibits no mass. There is no tenderness. There is no rebound and no guarding.  Abdomen soft with tenderness in the right lower quadrant and voluntary guarding  Musculoskeletal: Normal range of motion. He exhibits no edema.  Neurological: He  is alert.  Speech is clear and goal oriented Moves extremities without ataxia  Skin: Skin is warm and dry. He is not diaphoretic. No pallor.  No pallor Warm and dry  Psychiatric: He has a normal mood and affect.    EKG Interpretation  Date/Time:  Tuesday June 04 2014 17:28:37 EDT Ventricular Rate:  86 PR Interval:  155 QRS Duration: 106 QT Interval:  374 QTC Calculation: 447 R Axis:   136 Text Interpretation:  Sinus rhythm Marked pt movement artifact. Confirmed by Jeneen Rinks  MD, [place] ([id number]) on 06/04/2014 11:42:47 PM        CRITICAL CARE Performed by: Abigail Butts Total critical care time: 40min Critical care time was exclusive of separately billable procedures and treating other patients. Critical care was necessary to treat or prevent imminent or life-threatening deterioration. Critical care was time spent personally by me on the following activities: development of treatment plan with patient and/or surrogate as well as nursing, discussions with consultants, evaluation of patient's response to treatment, examination of patient, obtaining history from patient or surrogate, ordering and performing treatments and interventions, ordering and review of laboratory studies, ordering and review of radiographic studies, pulse oximetry and re-evaluation of patient's condition.  ED Course  Procedures  MDM  ODDIS WESTLING sense of weakness, shortness of breath, early satiety and right lower quadrant abdominal pain.  Question possible COPD exacerbation, will treat with albuterol. Patient does wear oxygen at home and has normal oxygen saturations on oxygen here in the emergency department.  Concern for possible abdominal etiology with early satiety and right lower part abdominal pain. Will obtain CT scan.  Patient also  with generalized weakness, recent fall hitting his head and Coumadin usage. Will obtain CT head and neck in addition to EKG, troponin etc.  6:00PM Reticulocyte count  1 - no evidence of sickle cell crisis.  6:25PM CMP with hypoglycemia at 47. Repeat shows hypoglycemia at 13.  Patient given one amp of D50.  Recheck shows increased only to 43. Will give second amp of D50 and again D10 drip.  Patient with decreased as her distress after a Bureau treatment and improved lung sounds.  8:55PM Recheck BMP with progressively decreasing sodium to 117 likely secondary to patient's now hyperglycemia.  CT scan abdomen pending. CT scan head without acute abnormality.  Chest x-ray without evidence of pneumonia.  9:45PM CT scan abdomen with thickening of the gastric antrum perhaps due to gastritis however with patient's early satiety, decreased appetite, hypoglycmeia and recent weight loss I am concerned about possible gastric cancer.  11:20 PM Discussed with Mart Piggs who will admit to [facility].    BP 116/68  Pulse 91  Temp(Src) 97.5 F (36.4 C) (Rectal)  Resp 18  SpO2 100%  The patient was discussed with and seen by Dr. Jeneen Rinks who agrees with the treatment plan.    Jarrett Soho Izick Gasbarro, PA-C 06/05/14 [phone]

## 2014-06-04 NOTE — ED Notes (Signed)
Attempted in and out cath, unable to obtain specimen. Will try again

## 2014-06-04 NOTE — ED Provider Notes (Signed)
CSN: [id number]     Arrival date & time 06/04/14  1652 History   First MD Initiated Contact with Patient 06/04/14 1709     Chief Complaint  Patient presents with  . Weakness  . lack of appetite    HPI Patient is a 78 year old with PMH of COPD, sickle cell disease who presents with weakness and decreased appetite. He has not eaten anything including his ensure since Friday. He states that he is hungry but after a few bites he doesn't want any more. He denies N/V but has diarrhea. He noted some blood in his stool a couple of days ago but it resolved. Reports dark brown stools. No CP, palpitations, peripheral edema. He has dyspnea and cough but it is his baseline. No fevers, chills, or recent illnesses. numbness, tingling. Loss of bladder or bowel. He denies taking any of his medications since Friday.  He also presents with weakness. He fell a couple of days ago and hit his head but denies loss of consciousness. He takes He said he felt lightheaded and could not get up after he fell due to weakness.  Past Medical History  Diagnosis Date  . COPD (chronic obstructive pulmonary disease)   . Sickle cell anemia   . Pulmonary embolism    Past Surgical History  Procedure Laterality Date  . No past surgeries    . Colonoscopy N/A 03/09/2013    Procedure: COLONOSCOPY;  Surgeon: Beryle Beams, MD;  Location: [place];  Service: Endoscopy;  Laterality: N/A;  . Esophagogastroduodenoscopy N/A 03/09/2013    Procedure: ESOPHAGOGASTRODUODENOSCOPY (EGD);  Surgeon: Beryle Beams, MD;  Location: [facility] ENDOSCOPY;  Service: Endoscopy;  Laterality: N/A;   Family History  Problem Relation Age of Onset  . Sickle cell anemia Mother    History  Substance Use Topics  . Smoking status: Light Tobacco Smoker  . Smokeless tobacco: Not on file  . Alcohol Use: No    Review of Systems  10 Systems reviewed and are negative for acute change except as noted in the HPI.     Allergies  Review of patient's allergies  indicates no known allergies.  Home Medications   Prior to Admission medications   Medication Sig Start Date End Date Taking? Authorizing Provider  albuterol-ipratropium (COMBIVENT) 18-103 MCG/ACT inhaler Inhale 2 puffs into the lungs 4 (four) times daily.   Yes Historical Provider, MD  ENSURE (ENSURE) Take 237 mLs by mouth 3 (three) times daily between meals.   Yes Historical Provider, MD  ferrous sulfate 325 (65 FE) MG tablet Take 325 mg by mouth 2 (two) times daily with a meal. 03/09/13  Yes Sorin C Laza, MD  fluticasone (FLONASE) 50 MCG/ACT nasal spray Place 2 sprays into the nose daily. 12/30/12  Yes Thurnell Lose, MD  pantoprazole (PROTONIX) 40 MG tablet Take 1 tablet (40 mg total) by mouth daily at 12 noon. 03/09/13  Yes Sorin June Leap, MD  Vitamin D, Ergocalciferol, (DRISDOL) 50000 UNITS CAPS Take 50,000 Units by mouth every 7 (seven) days. On mondays   Yes Historical Provider, MD   BP 116/68  Pulse 91  Temp(Src) 97.5 F (36.4 C) (Rectal)  Resp 18  SpO2 100% Physical Exam  Vitals reviewed. Constitutional: He appears cachectic. He is cooperative. No distress.  HENT:  Head: Normocephalic and atraumatic.  Mouth/Throat: Oropharynx is clear and moist. Abnormal dentition. Dental caries present.  Eyes: Conjunctivae and EOM are normal. Pupils are equal, round, and reactive to light.  Neck: Normal  range of motion. Neck supple. No JVD present.  Cardiovascular: Normal rate, regular rhythm, normal heart sounds and intact distal pulses.   No murmur heard. Pulmonary/Chest: Effort normal and breath sounds normal. He has no wheezes.  Patient is in moderate respiratory distress with accessory muscle use.  Abdominal: Soft. Bowel sounds are normal. There is no hepatosplenomegaly. There is tenderness in the right lower quadrant. There is no rigidity, no rebound and no CVA tenderness.  Voluntary guarding.  Musculoskeletal: He exhibits no edema and no tenderness.  Lymphadenopathy:    He has no  cervical adenopathy.  Neurological: He is alert. He has normal reflexes. No cranial nerve deficit. He exhibits normal muscle tone.  Skin: Skin is warm and dry.  Psychiatric: He has a normal mood and affect. His behavior is normal. Thought content normal.    ED Course  Procedures (including critical care time) Labs Review Labs Reviewed  CBC WITH DIFFERENTIAL - Abnormal; Notable for the following:    RBC 3.53 (*)    Hemoglobin 8.4 (*)    HCT 24.8 (*)    MCV 70.3 (*)    MCH 23.8 (*)    RDW 19.6 (*)    Platelets 132 (*)    All other components within normal limits  COMPREHENSIVE METABOLIC PANEL - Abnormal; Notable for the following:    Sodium 127 (*)    Chloride 86 (*)    Glucose, Bld 47 (*)    Creatinine, Ser 1.91 (*)    Albumin 3.2 (*)    AST 59 (*)    GFR calc non Af Amer 32 (*)    GFR calc Af Amer 38 (*)    All other components within normal limits  URINALYSIS, ROUTINE W REFLEX MICROSCOPIC - Abnormal; Notable for the following:    Color, Urine RED (*)    APPearance CLOUDY (*)    Glucose, UA 250 (*)    Hgb urine dipstick LARGE (*)    Bilirubin Urine MODERATE (*)    Ketones, ur 15 (*)    Protein, ur 30 (*)    Leukocytes, UA TRACE (*)    All other components within normal limits  RETICULOCYTES - Abnormal; Notable for the following:    RBC. 3.53 (*)    All other components within normal limits  BASIC METABOLIC PANEL - Abnormal; Notable for the following:    Sodium 117 (*)    Chloride 82 (*)    Glucose, Bld 374 (*)    Creatinine, Ser 1.63 (*)    Calcium 7.9 (*)    GFR calc non Af Amer 39 (*)    GFR calc Af Amer 46 (*)    Anion gap 16 (*)    All other components within normal limits  CBG MONITORING, ED - Abnormal; Notable for the following:    Glucose-Capillary 13 (*)    All other components within normal limits  CBG MONITORING, ED - Abnormal; Notable for the following:    Glucose-Capillary 26 (*)    All other components within normal limits  CBG MONITORING, ED -  Abnormal; Notable for the following:    Glucose-Capillary 43 (*)    All other components within normal limits  CBG MONITORING, ED - Abnormal; Notable for the following:    Glucose-Capillary 414 (*)    All other components within normal limits  I-STAT CHEM 8, ED - Abnormal; Notable for the following:    Sodium 121 (*)    Chloride 85 (*)    Creatinine, Ser 1.70 (*)  Glucose, Bld 344 (*)    Hemoglobin 8.8 (*)    HCT 26.0 (*)    All other components within normal limits  URINE MICROSCOPIC-ADD ON  POC OCCULT BLOOD, ED  I-STAT TROPOININ, ED    Imaging Review Ct Abdomen Pelvis Wo Contrast  06/04/2014   CLINICAL DATA:  Loss of appetite.  Weakness  EXAM: CT ABDOMEN AND PELVIS WITHOUT CONTRAST  TECHNIQUE: Multidetector CT imaging of the abdomen and pelvis was performed following the standard protocol without IV contrast.  COMPARISON:  CT abdomen pelvis 03/01/2013  FINDINGS: Bibasilar scarring as noted previously.  Unenhanced images of the liver are normal. Calcified granulomata in the liver and spleen. Kidneys reveal no mass or obstruction. Pancreas is negative.  Thickening of the gastric antrum may be due to incomplete distention versus gastritis. No focal mass. No bowel obstruction. No free fluid or adenopathy. Mild sigmoid diverticulosis.  The patient has lost considerable weight since the prior CT.  IMPRESSION: Thickening of the gastric antrum which may be due to gastritis or incomplete distention. Otherwise no acute abnormality  Sigmoid diverticulosis  Pulmonary fibrosis.   Electronically Signed   By: Franchot Gallo M.D.   On: 06/04/2014 21:32   Ct Head Wo Contrast  06/04/2014   CLINICAL DATA:  Head and neck pain. Weakness. Fell several days ago  EXAM: CT HEAD WITHOUT CONTRAST  CT CERVICAL SPINE WITHOUT CONTRAST  TECHNIQUE: Multidetector CT imaging of the head and cervical spine was performed following the standard protocol without intravenous contrast. Multiplanar CT image reconstructions of the  cervical spine were also generated.  COMPARISON:  CT head 12/29/2012  FINDINGS: CT HEAD FINDINGS  Generalized atrophy. Chronic microvascular ischemic changes in the white matter are stable.  Negative for acute infarct.  Negative for hemorrhage or mass.  Negative for fracture. Atherosclerotic disease. Prior eye surgery on the right.  CT CERVICAL SPINE FINDINGS  Disc degeneration and spondylosis throughout the cervical spine most severe at C3-4 and C4-5. Diffuse facet degeneration. There is multilevel spinal stenosis most prominent at C3-4 and C4-5.  Negative for fracture or mass.  IMPRESSION: No acute intracranial abnormality  Cervical spondylosis.  Negative for cervical fracture.   Electronically Signed   By: Franchot Gallo M.D.   On: 06/04/2014 20:07   Ct Cervical Spine Wo Contrast  06/04/2014   CLINICAL DATA:  Head and neck pain. Weakness. Fell several days ago  EXAM: CT HEAD WITHOUT CONTRAST  CT CERVICAL SPINE WITHOUT CONTRAST  TECHNIQUE: Multidetector CT imaging of the head and cervical spine was performed following the standard protocol without intravenous contrast. Multiplanar CT image reconstructions of the cervical spine were also generated.  COMPARISON:  CT head 12/29/2012  FINDINGS: CT HEAD FINDINGS  Generalized atrophy. Chronic microvascular ischemic changes in the white matter are stable.  Negative for acute infarct.  Negative for hemorrhage or mass.  Negative for fracture. Atherosclerotic disease. Prior eye surgery on the right.  CT CERVICAL SPINE FINDINGS  Disc degeneration and spondylosis throughout the cervical spine most severe at C3-4 and C4-5. Diffuse facet degeneration. There is multilevel spinal stenosis most prominent at C3-4 and C4-5.  Negative for fracture or mass.  IMPRESSION: No acute intracranial abnormality  Cervical spondylosis.  Negative for cervical fracture.   Electronically Signed   By: Franchot Gallo M.D.   On: 06/04/2014 20:07   Dg Chest Portable 1 View  06/04/2014   CLINICAL  DATA:  Weakness  EXAM: PORTABLE CHEST - 1 VIEW  COMPARISON:  03/06/2013  FINDINGS: COPD with pulmonary hyperinflation. Chronic scarring in the bases bilaterally is unchanged from prior studies. Negative for superimposed pneumonia or heart failure.  IMPRESSION: Chronic lung disease. No acute abnormality and no change from prior studies.   Electronically Signed   By: Franchot Gallo M.D.   On: 06/04/2014 18:26     EKG Interpretation None     CRITICAL CARE Performed by: Pura Spice   Total critical care time: 35  Critical care time was exclusive of separately billable procedures and treating other patients.  Critical care was necessary to treat or prevent imminent or life-threatening deterioration.  Critical care was time spent personally by me on the following activities: development of treatment plan with patient and/or surrogate as well as nursing, discussions with consultants, evaluation of patient's response to treatment, examination of patient, obtaining history from patient or surrogate, ordering and performing treatments and interventions, ordering and review of laboratory studies, ordering and review of radiographic studies, pulse oximetry and re-evaluation of patient's condition.   MDM   Final diagnoses:  Anemia, unspecified anemia type  Hypokalemia  SICKLE CELL ANEMIA  Hypoglycemia  Hypotension, unspecified hypotension type  AKI (acute kidney injury)   Patient with PMH of sickle cell and COPD presents with weakness and SOB. Patient without AMS vitals stable except for hypotension. In ED glucose 47 and patient was given 1 L NS and 518ml bolus of D5. Patient also found to have hypokalemia and hypochloremia.  Repeat glucose testing was 13 and 26 so D50 was administered. Repeat glucose was 47. Additional D50 bolus and D10 drip. Baseline creatine 1.0 and is 1.91 today, BUN unremarkable. Patient with anuria and unable to obtain urine with in and out cath.   1946 Blood  pressure improved.   21:03 Glucose 414. D10 drip discontinued.  SOB and cough are at his baseline. No chest pain, fever or acute pulmonary infiltrate . CXR negative. EKG and troponin unremarkable. Doubt acute chest syndrome or cardiopulmonary etiology.   CT of head and C-spine due to recent fall and neck pain without signs of acute hemorrhage or infarction. C-spine with cervical spondylosis without acute fracture.  Abdominal pain: Patient with sickle cell with RLQ pain. Negative fecal occult with low HGB and HCT. CT abdomen w/o contrast reveals a thickened gastric antrum may be due to gastritis or incomplete distention. Due to patients weight loss, subjective chills, and early satiety, patient may have gastric cancer.  Patient discussed with triad hospitalist who agree to admit due to hypoglycemia, hyponatremia and further evaluation for gastric Ca.  This is a shared patient. This patient was seen and evaluated but the physician.      Pura Spice, PA-C 06/04/14 2349

## 2014-06-04 NOTE — ED Provider Notes (Signed)
Pt seen and examined.  D/W PA Creed.  Initially glycemic at 15. Given one D50. Blood sugar only 43. Given a second amp and started on D. 10 infusion. Repeat blood sugars increased and most recently at 410.   D10 has been decreased to D5.  Pt describes early satiety and poor appetite.  No AP.  No melena or dark stools.  No sweats or other B symptoms.No pronounced sudden weight loss. Na 117 2/2 hyperglycemia, however initial Na 127.  Plan is admission for further hydration, CBG monitoring.  CT shows thickened antrum of stomach vs non-distention.  No additional intra abdominal abnormalities.  Tanna Furry, MD 06/04/14 2249

## 2014-06-05 DIAGNOSIS — E162 Hypoglycemia, unspecified: Secondary | ICD-10-CM | POA: Diagnosis present

## 2014-06-05 DIAGNOSIS — D649 Anemia, unspecified: Secondary | ICD-10-CM

## 2014-06-05 DIAGNOSIS — D62 Acute posthemorrhagic anemia: Secondary | ICD-10-CM | POA: Diagnosis present

## 2014-06-05 DIAGNOSIS — Z86711 Personal history of pulmonary embolism: Secondary | ICD-10-CM | POA: Diagnosis not present

## 2014-06-05 DIAGNOSIS — Z681 Body mass index (BMI) 19 or less, adult: Secondary | ICD-10-CM | POA: Diagnosis not present

## 2014-06-05 DIAGNOSIS — E43 Unspecified severe protein-calorie malnutrition: Secondary | ICD-10-CM | POA: Diagnosis present

## 2014-06-05 DIAGNOSIS — F17200 Nicotine dependence, unspecified, uncomplicated: Secondary | ICD-10-CM | POA: Diagnosis present

## 2014-06-05 DIAGNOSIS — Z79899 Other long term (current) drug therapy: Secondary | ICD-10-CM | POA: Diagnosis not present

## 2014-06-05 DIAGNOSIS — D500 Iron deficiency anemia secondary to blood loss (chronic): Secondary | ICD-10-CM | POA: Diagnosis present

## 2014-06-05 DIAGNOSIS — D571 Sickle-cell disease without crisis: Secondary | ICD-10-CM | POA: Diagnosis present

## 2014-06-05 DIAGNOSIS — R5381 Other malaise: Secondary | ICD-10-CM | POA: Diagnosis present

## 2014-06-05 DIAGNOSIS — J449 Chronic obstructive pulmonary disease, unspecified: Secondary | ICD-10-CM | POA: Diagnosis present

## 2014-06-05 DIAGNOSIS — E871 Hypo-osmolality and hyponatremia: Secondary | ICD-10-CM | POA: Diagnosis present

## 2014-06-05 DIAGNOSIS — N179 Acute kidney failure, unspecified: Secondary | ICD-10-CM | POA: Diagnosis present

## 2014-06-05 DIAGNOSIS — N182 Chronic kidney disease, stage 2 (mild): Secondary | ICD-10-CM | POA: Diagnosis present

## 2014-06-05 LAB — BASIC METABOLIC PANEL
Anion gap: 8 (ref 5–15)
BUN: 12 mg/dL (ref 6–23)
CO2: 30 meq/L (ref 19–32)
Calcium: 8.2 mg/dL — ABNORMAL LOW (ref 8.4–10.5)
Chloride: 84 mEq/L — ABNORMAL LOW (ref 96–112)
Creatinine, Ser: 1.44 mg/dL — ABNORMAL HIGH (ref 0.50–1.35)
GFR calc non Af Amer: 46 mL/min — ABNORMAL LOW (ref >90)
GFR, EST AFRICAN AMERICAN: 53 mL/min — AB (ref >90)
Glucose, Bld: 130 mg/dL — ABNORMAL HIGH (ref 70–99)
Potassium: 3.6 mEq/L — ABNORMAL LOW (ref 3.7–5.3)
Sodium: 122 mEq/L — ABNORMAL LOW (ref 137–147)

## 2014-06-05 LAB — CBC
HEMATOCRIT: 22.3 % — AB (ref 39.0–52.0)
HEMOGLOBIN: 7.9 g/dL — AB (ref 13.0–17.0)
MCH: 24.2 pg — AB (ref 26.0–34.0)
MCHC: 35.4 g/dL (ref 30.0–36.0)
MCV: 68.4 fL — ABNORMAL LOW (ref 78.0–100.0)
Platelets: 142 10*3/uL — ABNORMAL LOW (ref 150–400)
RBC: 3.26 MIL/uL — ABNORMAL LOW (ref 4.22–5.81)
RDW: 19.3 % — ABNORMAL HIGH (ref 11.5–15.5)
WBC: 4.3 10*3/uL (ref 4.0–10.5)

## 2014-06-05 LAB — GLUCOSE, CAPILLARY
GLUCOSE-CAPILLARY: 96 mg/dL (ref 70–99)
GLUCOSE-CAPILLARY: 83 mg/dL (ref 70–99)
Glucose-Capillary: 13 mg/dL — CL (ref 70–99)
Glucose-Capillary: 109 mg/dL — ABNORMAL HIGH (ref 70–99)
Glucose-Capillary: 61 mg/dL — ABNORMAL LOW (ref 70–99)
Glucose-Capillary: 76 mg/dL (ref 70–99)

## 2014-06-05 LAB — HEMOGLOBIN A1C
Hgb A1c MFr Bld: 5.2 % (ref <5.7)
Mean Plasma Glucose: 103 mg/dL (ref <117)

## 2014-06-05 MED ORDER — FLUTICASONE PROPIONATE 50 MCG/ACT NA SUSP
2.0000 | Freq: Every day | NASAL | Status: DC
Start: 2014-06-05 — End: 2014-06-10
  Administered 2014-06-05 – 2014-06-10 (×6): 2 via NASAL
  Filled 2014-06-05: qty 16

## 2014-06-05 MED ORDER — FERROUS SULFATE 325 (65 FE) MG PO TABS
325.0000 mg | ORAL_TABLET | Freq: Two times a day (BID) | ORAL | Status: DC
  Administered 2014-06-05 – 2014-06-10 (×11): 325 mg via ORAL
  Filled 2014-06-05 (×13): qty 1

## 2014-06-05 MED ORDER — VITAMIN D (ERGOCALCIFEROL) 1.25 MG (50000 UNIT) PO CAPS
50000.0000 [IU] | ORAL_CAPSULE | ORAL | Status: DC
  Administered 2014-06-10: 50000 [IU] via ORAL
  Filled 2014-06-05: qty 1

## 2014-06-05 MED ORDER — ONDANSETRON HCL 4 MG PO TABS
4.0000 mg | ORAL_TABLET | Freq: Four times a day (QID) | ORAL | Status: DC | PRN
  Administered 2014-06-09: 4 mg via ORAL
  Filled 2014-06-05: qty 1

## 2014-06-05 MED ORDER — OXYCODONE HCL 5 MG PO TABS
5.0000 mg | ORAL_TABLET | ORAL | Status: DC | PRN
  Administered 2014-06-05 – 2014-06-10 (×11): 5 mg via ORAL
  Filled 2014-06-05 (×11): qty 1

## 2014-06-05 MED ORDER — CETYLPYRIDINIUM CHLORIDE 0.05 % MT LIQD
7.0000 mL | Freq: Two times a day (BID) | OROMUCOSAL | Status: DC
  Administered 2014-06-05 – 2014-06-10 (×9): 7 mL via OROMUCOSAL

## 2014-06-05 MED ORDER — PANTOPRAZOLE SODIUM 40 MG PO TBEC
40.0000 mg | DELAYED_RELEASE_TABLET | Freq: Every day | ORAL | Status: DC
  Administered 2014-06-05 – 2014-06-09 (×5): 40 mg via ORAL
  Filled 2014-06-05 (×7): qty 1

## 2014-06-05 MED ORDER — SODIUM CHLORIDE 0.9 % IV SOLN
INTRAVENOUS | Status: DC
  Administered 2014-06-05 – 2014-06-08 (×6): via INTRAVENOUS

## 2014-06-05 MED ORDER — SODIUM CHLORIDE 0.9 % IJ SOLN
3.0000 mL | Freq: Two times a day (BID) | INTRAMUSCULAR | Status: DC
  Administered 2014-06-06 – 2014-06-10 (×3): 3 mL via INTRAVENOUS

## 2014-06-05 MED ORDER — ONDANSETRON HCL 4 MG/2ML IJ SOLN: 4.0000 mg | Freq: Four times a day (QID) | INTRAMUSCULAR | Status: DC | PRN

## 2014-06-05 MED ORDER — IPRATROPIUM-ALBUTEROL 18-103 MCG/ACT IN AERO: 2.0000 | INHALATION_SPRAY | Freq: Four times a day (QID) | RESPIRATORY_TRACT | Status: DC

## 2014-06-05 MED ORDER — HYDROMORPHONE HCL PF 1 MG/ML IJ SOLN
1.0000 mg | INTRAMUSCULAR | Status: DC | PRN
  Administered 2014-06-08: 1 mg via INTRAVENOUS
  Filled 2014-06-05: qty 1

## 2014-06-05 MED ORDER — FERROUS SULFATE 325 (65 FE) MG PO TABS: 325.0000 mg | ORAL_TABLET | Freq: Two times a day (BID) | ORAL | Status: DC

## 2014-06-05 MED ORDER — CHLORHEXIDINE GLUCONATE 0.12 % MT SOLN
15.0000 mL | Freq: Two times a day (BID) | OROMUCOSAL | Status: DC
Start: 2014-06-05 — End: 2014-06-10
  Administered 2014-06-05 – 2014-06-10 (×11): 15 mL via OROMUCOSAL
  Filled 2014-06-05 (×13): qty 15

## 2014-06-05 MED ORDER — IPRATROPIUM-ALBUTEROL 0.5-2.5 (3) MG/3ML IN SOLN
3.0000 mL | Freq: Four times a day (QID) | RESPIRATORY_TRACT | Status: DC
  Administered 2014-06-05 – 2014-06-08 (×13): 3 mL via RESPIRATORY_TRACT
  Filled 2014-06-05 (×11): qty 3

## 2014-06-05 MED ORDER — ENSURE COMPLETE PO LIQD
237.0000 mL | Freq: Three times a day (TID) | ORAL | Status: DC
  Administered 2014-06-05 – 2014-06-10 (×14): 237 mL via ORAL
  Filled 2014-06-05 (×10): qty 237

## 2014-06-05 NOTE — Progress Notes (Signed)
INITIAL NUTRITION ASSESSMENT  DOCUMENTATION CODES Per approved criteria  -Severe malnutrition in the context of chronic illness -Underweight  Pt meets criteria for severe MALNUTRITION in the context of chronic illness as evidenced by PO intake < 75% for > one month, 11% body weight loss in one month, severe muscle wasting and subcutaneous fat loss.   INTERVENTION: -Recommend Ensure Complete TID -Encouraged pt to consume 8PM nourishment -Will continue to monitor  NUTRITION DIAGNOSIS: Inadequate oral intake related to early satiety as evidenced by PO intake < 75%, 11% body weight loss in one month.   Goal: Pt to meet >/= 90% of their estimated nutrition needs    Monitor:  Total protein/energy intake, labs, weights, GI profile  Reason for Assessment: MST/Underweight BMI  78 y.o. male  Admitting Dx: <principal problem not specified>  ASSESSMENT: Pt is 78 yo male who presents to [facility] ED with main concern of several weeks duration of progressive weakness, poor oral intake, weight loss. Pt explains he is having difficulty with ambulation due to the weakness.  -Pt reported an progressive weight loss of 65 lbs since 2009. Has experience 12 lbs weight loss in one month (11% body weight, severe for time frame) -Endorses feelings of weakness and early satiety that contribute to decreased appetite -Diet recall indicates pt consuming three small meals/day. Breakfast consists of cereal or eggs, lunch typically sandwich with cold cuts, and dinner is supplied by [employer] Wheels program. Drinks one Ensure Complete supplement daily -Has had minimal PO intake during admit. Was willing to try to consume Ensure TID and promoted intake of 8PM snack of crackers w/peanut butter and milk (to be provided by nourishments on unit) -MD expressed concern for possible gastritis vs gastric cancer. -Pt with evident severe muscle wasting and subcutaneous fat loss  Height: Ht Readings from Last 1 Encounters:   06/05/14 5\' 10"  (1.778 m)    Weight: Wt Readings from Last 1 Encounters:  06/05/14 98 lb 8.7 oz (44.7 kg)    Ideal Body Weight: 166 lbs  % Ideal Body Weight: 59%  Wt Readings from Last 10 Encounters:  06/05/14 98 lb 8.7 oz (44.7 kg)  03/09/13 115 lb 15.4 oz (52.6 kg)  03/09/13 115 lb 15.4 oz (52.6 kg)  12/29/12 101 lb 13.6 oz (46.2 kg)  04/10/09 136 lb 8 oz (61.916 kg)    Usual Body Weight: 163 lbs (2009), 110 lb (05/2014)  % Usual Body Weight: 60%, 89%  BMI:  Body mass index is 14.14 kg/(m^2). Underweight  Estimated Nutritional Needs: Kcal: 1550-1750 Protein: 80-95 gram Fluid: >/=1600 ml/daily  Skin: WDL  Diet Order: General  EDUCATION NEEDS: -Education needs addressed   Intake/Output Summary (Last 24 hours) at 06/05/14 1511 Last data filed at 06/05/14 1300  Gross per 24 hour  Intake  777.5 ml  Output    400 ml  Net  377.5 ml    Last BM: 8/03   Labs:   Recent Labs Lab 06/04/14 1745 06/04/14 2039 06/04/14 2046 06/05/14 0405  NA 127* 121* 117* 122*  K 4.4 4.5 4.7 3.6*  CL 86* 85* 82* 84*  CO2 26  --  19 30  BUN 11 11 12 12   CREATININE 1.91* 1.70* 1.63* 1.44*  CALCIUM 9.2  --  7.9* 8.2*  GLUCOSE 47* 344* 374* 130*    CBG (last 3)   Recent Labs  06/05/14 0741 06/05/14 1154 06/05/14 1214  GLUCAP 109* 61* 96    Scheduled Meds: . antiseptic oral rinse  7  mL Mouth Rinse BID  . chlorhexidine  15 mL Mouth Rinse BID  . feeding supplement (ENSURE COMPLETE)  237 mL Oral TID BM  . ferrous sulfate  325 mg Oral BID WC  . fluticasone  2 spray Each Nare Daily  . ipratropium-albuterol  3 mL Nebulization QID  . pantoprazole  40 mg Oral Q1200  . sodium chloride  3 mL Intravenous Q12H  . [START ON 06/10/2014] Vitamin D (Ergocalciferol)  50,000 Units Oral Q7 days    Continuous Infusions: . sodium chloride 75 mL/hr at 06/05/14 1447    Past Medical History  Diagnosis Date  . COPD (chronic obstructive pulmonary disease)   . Sickle cell anemia    . Pulmonary embolism     Past Surgical History  Procedure Laterality Date  . No past surgeries    . Colonoscopy N/A 03/09/2013    Procedure: COLONOSCOPY;  Surgeon: Beryle Beams, MD;  Location: [place];  Service: Endoscopy;  Laterality: N/A;  . Esophagogastroduodenoscopy N/A 03/09/2013    Procedure: ESOPHAGOGASTRODUODENOSCOPY (EGD);  Surgeon: Beryle Beams, MD;  Location: [facility] ENDOSCOPY;  Service: Endoscopy;  Laterality: N/A;    Atlee Abide MS RD LDN Clinical Dietitian [phone]

## 2014-06-05 NOTE — H&P (Signed)
[facility] History and Physical  Anthony [name] MOQ:[id number] DOB: 1936/05/31 DOA: 06/04/2014  Referring physician: ED physician PCP: Philis Fendt, MD   Chief Complaint: weakness   HPI:  Pt is 78 yo male who presents to [facility] ED with main concern of several weeks duration of progressive weakness, poor oral intake, weight loss. Pt explains he is having difficulty with ambulation due to the weakness. No chest pain or shortness of breath, no abdominal or urinary concerns.   In ED, pt noted to be hemodynamically stable, one episode of hypoglycemia recorded and pt given D50. Sodium 117, TRH asked to admit for further evaluation.   Assessment and Plan: Active Problems:   Hyponatremia - partially from pre renal etiology from poor oral intake - admit to telemetry bed - provide IVF and repeat BMP in AM   Acute renal failure, chronic renal disease stage II  - from pre renal etiology as well - IVF as noted above and repeat BMP in AM   COPD - clinically compensated - provide BD scheduled and as needed   Hypoglycemia - advance diet as pt able to tolerate    Acute on chronic blood loss anemia - no signs of active bleeding - repeat CBC in AM  DVT: SCD's     Radiological Exams on Admission: Ct Abdomen Pelvis Wo Contrast  06/04/2014  Thickening of the gastric antrum which may be due to gastritis or incomplete distention. Otherwise no acute abnormality  Sigmoid diverticulosis  Pulmonary fibrosis.     Ct Head Wo Contrast 06/04/2014  No acute intracranial abnormality  Cervical spondylosis.  Negative for cervical fracture.     Ct Cervical Spine Wo Contrast  06/04/2014  No acute intracranial abnormality  Cervical spondylosis.  Negative for cervical fracture.   Electronically Signed   By: Franchot Gallo M.D.   On: 06/04/2014 20:07   Dg Chest Portable 1 View  06/04/2014   CLINICAL DATA:  Weakness  EXAM: PORTABLE CHEST - 1 VIEW  COMPARISON:  03/06/2013  FINDINGS: COPD with pulmonary  hyperinflation. Chronic scarring in the bases bilaterally is unchanged from prior studies. Negative for superimposed pneumonia or heart failure.  IMPRESSION: Chronic lung disease. No acute abnormality and no change from prior studies.   Electronically Signed   By: Franchot Gallo M.D.   On: 06/04/2014 18:26     Code Status: Full Family Communication: Pt at bedside Disposition Plan: Admit for further evaluation     Review of Systems:  Constitutional:Negative for diaphoresis.  HENT: Negative for hearing loss, ear pain, nosebleeds, congestion, sore throat, neck pain, tinnitus and ear discharge.   Eyes: Negative for blurred vision, double vision, photophobia, pain, discharge and redness.  Respiratory: Negative for cough, hemoptysis, sputum production, shortness of breath, wheezing and stridor.   Cardiovascular: Negative for chest pain, palpitations, orthopnea, claudication and leg swelling.  Gastrointestinal: Negative for nausea, vomiting and abdominal pain.  Genitourinary: Negative for dysuria, urgency, frequency, hematuria and flank pain.  Musculoskeletal: Negative for myalgias, and falls.  Skin: Negative for itching and rash.  Neurological: Negative for dizziness Endo/Heme/Allergies: Negative for environmental allergies and polydipsia. Does not bruise/bleed easily.  Psychiatric/Behavioral: Negative for suicidal ideas. The patient is not nervous/anxious.      Past Medical History  Diagnosis Date  . COPD (chronic obstructive pulmonary disease)   . Sickle cell anemia   . Pulmonary embolism     Past Surgical History  Procedure Laterality Date  . No past surgeries    . Colonoscopy N/A  03/09/2013    Procedure: COLONOSCOPY;  Surgeon: Beryle Beams, MD;  Location: [place];  Service: Endoscopy;  Laterality: N/A;  . Esophagogastroduodenoscopy N/A 03/09/2013    Procedure: ESOPHAGOGASTRODUODENOSCOPY (EGD);  Surgeon: Beryle Beams, MD;  Location[facility] ENDOSCOPY;  Service: Endoscopy;   Laterality: N/A;    Social History:  reports that he has been smoking.  He does not have any smokeless tobacco history on file. He reports that he does not drink alcohol. His drug history is not on file.  No Known Allergies  Family History  Problem Relation Age of Onset  . Sickle cell anemia Mother     Prior to Admission medications   Medication Sig Start Date End Date Taking? Authorizing Provider  albuterol-ipratropium (COMBIVENT) 18-103 MCG/ACT inhaler Inhale 2 puffs into the lungs 4 (four) times daily.   Yes Historical Provider, MD  ENSURE (ENSURE) Take 237 mLs by mouth 3 (three) times daily between meals.   Yes Historical Provider, MD  ferrous sulfate 325 (65 FE) MG tablet Take 325 mg by mouth 2 (two) times daily with a meal. 03/09/13  Yes Sorin C Laza, MD  fluticasone (FLONASE) 50 MCG/ACT nasal spray Place 2 sprays into the nose daily. 12/30/12  Yes Thurnell Lose, MD  pantoprazole (PROTONIX) 40 MG tablet Take 1 tablet (40 mg total) by mouth daily at 12 noon. 03/09/13  Yes Sorin June Leap, MD  Vitamin D, Ergocalciferol, (DRISDOL) 50000 UNITS CAPS Take 50,000 Units by mouth every 7 (seven) days. On mondays   Yes Historical Provider, MD    Physical Exam: Filed Vitals:   06/04/14 1821 06/04/14 1946 06/04/14 2040 06/04/14 2344  BP:  125/58 116/68 112/63  Pulse:  91  84  Temp: 97.5 F (36.4 C)   97.6 F (36.4 C)  TempSrc: Rectal   Oral  Resp:  16 18 16   SpO2:  99% 100% 97%    Physical Exam  Constitutional: Appears cachectic.  HENT: Normocephalic. External right and left ear normal. Dry MM Eyes: Conjunctivae and EOM are normal. PERRLA, no scleral icterus.  Neck: Normal ROM. Neck supple. No JVD. No tracheal deviation. No thyromegaly.  CVS: RRR, S1/S2 +, no murmurs, no gallops, no carotid bruit.  Pulmonary: Effort and breath sounds normal, no stridor, rhonchi, diminished breath sounds at bases  Abdominal: Soft. BS +,  no distension, tenderness, rebound or guarding.  Musculoskeletal:  Normal range of motion. No edema and no tenderness.  Lymphadenopathy: No lymphadenopathy noted, cervical, inguinal. Neuro: Alert. Normal reflexes, muscle tone coordination. No cranial nerve deficit. Skin: Skin is warm and dry. No rash noted. Not diaphoretic. No erythema. No pallor.  Psychiatric: Normal mood and affect. Behavior, judgment, thought content normal.   Labs on Admission:  Basic Metabolic Panel:  Recent Labs Lab 06/04/14 1745 06/04/14 2039 06/04/14 2046  NA 127* 121* 117*  K 4.4 4.5 4.7  CL 86* 85* 82*  CO2 26  --  19  GLUCOSE 47* 344* 374*  BUN 11 11 12   CREATININE 1.91* 1.70* 1.63*  CALCIUM 9.2  --  7.9*   Liver Function Tests:  Recent Labs Lab 06/04/14 1745  AST 59*  ALT 11  ALKPHOS 69  BILITOT 1.0  PROT 7.1  ALBUMIN 3.2*   CBC:  Recent Labs Lab 06/04/14 1745 06/04/14 2039  WBC 4.2  --   NEUTROABS 2.5  --   HGB 8.4* 8.8*  HCT 24.8* 26.0*  MCV 70.3*  --   PLT 132*  --  Cardiac Enzymes: No results found for this basename: CKTOTAL, CKMB, CKMBINDEX, TROPONINI,  in the last 168 hours BNP: No components found with this basename: POCBNP,  CBG:  Recent Labs Lab 06/04/14 1942 06/04/14 1943 06/04/14 2000 06/04/14 2103 06/04/14 2209  GLUCAP 13* 26* 43* 414* 308*    EKG: Normal sinus rhythm, no ST/T wave changes  Faye Ramsay, MD  [facility] Pager [phone]  If 7PM-7AM, please contact night-coverage [email or web address] Password [email or web address] 06/05/2014, 12:01 AM

## 2014-06-05 NOTE — Progress Notes (Signed)
Patient seen and evaluated earlier this AM. Please refer to H and P for details regarding assessment and plan.  Will reassess next am.  Velvet Bathe

## 2014-06-05 NOTE — Care Management Note (Addendum)
    Page 1 of 1   06/10/2014     3:46:49 PM CARE MANAGEMENT NOTE 06/10/2014  Patient:  Anthony [name], Anthony [name]   Account Number:  [id number]  Date Initiated:  06/05/2014  Documentation initiated by:  [facility]  Subjective/Objective Assessment:   56 [place].ZO:XWRU,EA.     Action/Plan:   FROM HOME.HOME 02-[facility].   Anticipated DC Date:  06/10/2014   Anticipated DC Plan:  [place]  CM consult      Choice offered to / List presented to:  C-1 Patient           Status of service:  Completed, signed off Medicare Important Message given?  YES (If response is "NO", the following Medicare IM given date fields will be blank) Date Medicare IM given:  06/07/2014 Medicare IM given by:  [facility] Date Additional Medicare IM given:  06/10/2014 Additional Medicare IM given by:  [facility]  Discharge Disposition:  [place]  Per UR Regulation:  Reviewed for med. necessity/level of care/duration of stay  If discussed at [place] of Stay Meetings, dates discussed:    Comments:  06/10/14 Yanis Larin RN,BSN NCM [phone] D/C SNF.  06/07/14 Jaylun Fleener RN,BSN NCM [phone] PT-SNF.SW FOLLOWING.  06/05/14 Amran Malter RN,BSN NCM [phone] CONFIRMED PATIENT USES [facility] HOME 02,SHE IS AWARE PATIENT MAY NEED A TRAVEL [place] FOR HOME.DOES NOT HAVE TRAVEL HERE IN HOSPITAL, MAY NEED TRAVEL [place] @ D/C.PATIENT STATES HE WILL TRY TO HAVE FAMILY TO BRING IN HIS 02,BUT NOT SURE.MAY NEED TO CONTACT [facility] DME IF FAMILY UNABLE TO BRING IN.

## 2014-06-06 DIAGNOSIS — E43 Unspecified severe protein-calorie malnutrition: Secondary | ICD-10-CM | POA: Diagnosis present

## 2014-06-06 LAB — BASIC METABOLIC PANEL
Anion gap: 5 (ref 5–15)
Anion gap: 5 (ref 5–15)
BUN: 6 mg/dL (ref 6–23)
BUN: 5 mg/dL — ABNORMAL LOW (ref 6–23)
CALCIUM: 8.3 mg/dL — AB (ref 8.4–10.5)
CO2: 31 meq/L (ref 19–32)
CO2: 32 mEq/L (ref 19–32)
CREATININE: 0.81 mg/dL (ref 0.50–1.35)
Calcium: 8.2 mg/dL — ABNORMAL LOW (ref 8.4–10.5)
Chloride: 93 mEq/L — ABNORMAL LOW (ref 96–112)
Chloride: 94 mEq/L — ABNORMAL LOW (ref 96–112)
Creatinine, Ser: 0.81 mg/dL (ref 0.50–1.35)
GFR calc Af Amer: >90 mL/min (ref >90)
GFR calc Af Amer: >90 mL/min (ref >90)
GFR calc non Af Amer: 84 mL/min — ABNORMAL LOW (ref >90)
GFR calc non Af Amer: 84 mL/min — ABNORMAL LOW (ref >90)
GLUCOSE: 87 mg/dL (ref 70–99)
Glucose, Bld: 122 mg/dL — ABNORMAL HIGH (ref 70–99)
Potassium: 4.3 mEq/L (ref 3.7–5.3)
Potassium: 4.3 mEq/L (ref 3.7–5.3)
Sodium: 129 mEq/L — ABNORMAL LOW (ref 137–147)
Sodium: 131 mEq/L — ABNORMAL LOW (ref 137–147)

## 2014-06-06 LAB — GLUCOSE, CAPILLARY
GLUCOSE-CAPILLARY: 69 mg/dL — AB (ref 70–99)
GLUCOSE-CAPILLARY: 100 mg/dL — AB (ref 70–99)
Glucose-Capillary: 113 mg/dL — ABNORMAL HIGH (ref 70–99)
Glucose-Capillary: 101 mg/dL — ABNORMAL HIGH (ref 70–99)
Glucose-Capillary: 86 mg/dL (ref 70–99)
Glucose-Capillary: 125 mg/dL — ABNORMAL HIGH (ref 70–99)

## 2014-06-06 NOTE — ED Provider Notes (Signed)
Medical screening examination/treatment/procedure(s) were conducted as a shared visit with non-physician practitioner(s) and myself.  I personally evaluated the patient during the encounter.   EKG Interpretation   Date/Time:  Tuesday June 04 2014 17:37:10 EDT Ventricular Rate:  85 PR Interval:  149 QRS Duration: 95 QT Interval:  438 QTC Calculation: 521 R Axis:   174 Text Interpretation:  Sinus rhythm Biatrial enlargement Abnormal lateral Q  waves Probable anterior infarct, age indeterminate Prolonged QT interval  ED PHYSICIAN INTERPRETATION AVAILABLE IN [facility] Confirmed by  TEST, Record ([id number]) on 06/06/2014 7:42:31 AM      Please see my additional dictation  Tanna Furry, MD 06/06/14 [phone]

## 2014-06-06 NOTE — Progress Notes (Signed)
[facility] PROGRESS NOTE  RAYHAN GROLEAU ACZ:[id number] DOB: [birth year] DOA: 06/04/2014 PCP: Philis Fendt, MD  Assessment/Plan: Active Problems:   Hyponatremia - most likely due to poor oral solute intake given improvement with normal saline and history of poor oral intake. - BMP q 6 hours, will avoid overcorrecting sodium levels more than 12 meq in 24 hours    Protein-calorie malnutrition, severe - RD on board - Pt on nutritional supplement    COPD - compensated, continue home regimen    Anemia - no active bleeding - obtain anemia panel next am - monitor cbc next am.    Hypoglycemia - suspect poor oral intake as cause - monitor cbg's  Code Status: full Family Communication: No family at bedside Disposition Plan: Pending improvement in condition, obtain PT evaluation, Speech therapy evaluation   Consultants:  Speech therapy  PT  Procedures:  None  Antibiotics:  None  HPI/Subjective: Pt states that he is having trouble swallowing food. No acute issues reported overnight.  Objective: Filed Vitals:   06/06/14 1418  BP: 107/49  Pulse: 93  Temp: 98.4 F (36.9 C)  Resp: 16    Intake/Output Summary (Last 24 hours) at 06/06/14 1510 Last data filed at 06/06/14 1300  Gross per 24 hour  Intake 2102.06 ml  Output    950 ml  Net 1152.06 ml   Filed Weights   06/05/14 0024 06/06/14 0104  Weight: 44.7 kg (98 lb 8.7 oz) 46.1 kg (101 lb 10.1 oz)    Exam:   General:  Pt in nad, alert and awake  Cardiovascular: rrr, no mrg  Respiratory: cta bl, no wheezes, prolonged exp. phase  Abdomen: soft, Nd, nt  Musculoskeletal: no cyanosis or clubbing   Data Reviewed: Basic Metabolic Panel:  Recent Labs Lab 06/04/14 1745 06/04/14 2039 06/04/14 2046 06/05/14 0405  NA 127* 121* 117* 122*  K 4.4 4.5 4.7 3.6*  CL 86* 85* 82* 84*  CO2 26  --  19 30  GLUCOSE 47* 344* 374* 130*  BUN 11 11 12 12   CREATININE 1.91* 1.70* 1.63* 1.44*  CALCIUM 9.2   --  7.9* 8.2*   Liver Function Tests:  Recent Labs Lab 06/04/14 1745  AST 59*  ALT 11  ALKPHOS 69  BILITOT 1.0  PROT 7.1  ALBUMIN 3.2*   No results found for this basename: LIPASE, AMYLASE,  in the last 168 hours No results found for this basename: AMMONIA,  in the last 168 hours CBC:  Recent Labs Lab 06/04/14 1745 06/04/14 2039 06/05/14 0405  WBC 4.2  --  4.3  NEUTROABS 2.5  --   --   HGB 8.4* 8.8* 7.9*  HCT 24.8* 26.0* 22.3*  MCV 70.3*  --  68.4*  PLT 132*  --  142*   Cardiac Enzymes: No results found for this basename: CKTOTAL, CKMB, CKMBINDEX, TROPONINI,  in the last 168 hours BNP (last 3 results) No results found for this basename: PROBNP,  in the last 8760 hours CBG:  Recent Labs Lab 06/05/14 1701 06/05/14 2037 06/06/14 0349 06/06/14 0745 06/06/14 1148  GLUCAP 83 76 113* 101* 86    No results found for this or any previous visit (from the past 240 hour(s)).   Studies: Ct Abdomen Pelvis Wo Contrast  06/04/2014   CLINICAL DATA:  Loss of appetite.  Weakness  EXAM: CT ABDOMEN AND PELVIS WITHOUT CONTRAST  TECHNIQUE: Multidetector CT imaging of the abdomen and pelvis was performed following the standard protocol without IV contrast.  COMPARISON:  CT abdomen pelvis 03/01/2013  FINDINGS: Bibasilar scarring as noted previously.  Unenhanced images of the liver are normal. Calcified granulomata in the liver and spleen. Kidneys reveal no mass or obstruction. Pancreas is negative.  Thickening of the gastric antrum may be due to incomplete distention versus gastritis. No focal mass. No bowel obstruction. No free fluid or adenopathy. Mild sigmoid diverticulosis.  The patient has lost considerable weight since the prior CT.  IMPRESSION: Thickening of the gastric antrum which may be due to gastritis or incomplete distention. Otherwise no acute abnormality  Sigmoid diverticulosis  Pulmonary fibrosis.   Electronically Signed   By: Franchot Gallo M.D.   On: 06/04/2014 21:32   Ct  Head Wo Contrast  06/04/2014   CLINICAL DATA:  Head and neck pain. Weakness. Fell several days ago  EXAM: CT HEAD WITHOUT CONTRAST  CT CERVICAL SPINE WITHOUT CONTRAST  TECHNIQUE: Multidetector CT imaging of the head and cervical spine was performed following the standard protocol without intravenous contrast. Multiplanar CT image reconstructions of the cervical spine were also generated.  COMPARISON:  CT head 12/29/2012  FINDINGS: CT HEAD FINDINGS  Generalized atrophy. Chronic microvascular ischemic changes in the white matter are stable.  Negative for acute infarct.  Negative for hemorrhage or mass.  Negative for fracture. Atherosclerotic disease. Prior eye surgery on the right.  CT CERVICAL SPINE FINDINGS  Disc degeneration and spondylosis throughout the cervical spine most severe at C3-4 and C4-5. Diffuse facet degeneration. There is multilevel spinal stenosis most prominent at C3-4 and C4-5.  Negative for fracture or mass.  IMPRESSION: No acute intracranial abnormality  Cervical spondylosis.  Negative for cervical fracture.   Electronically Signed   By: Franchot Gallo M.D.   On: 06/04/2014 20:07   Ct Cervical Spine Wo Contrast  06/04/2014   CLINICAL DATA:  Head and neck pain. Weakness. Fell several days ago  EXAM: CT HEAD WITHOUT CONTRAST  CT CERVICAL SPINE WITHOUT CONTRAST  TECHNIQUE: Multidetector CT imaging of the head and cervical spine was performed following the standard protocol without intravenous contrast. Multiplanar CT image reconstructions of the cervical spine were also generated.  COMPARISON:  CT head 12/29/2012  FINDINGS: CT HEAD FINDINGS  Generalized atrophy. Chronic microvascular ischemic changes in the white matter are stable.  Negative for acute infarct.  Negative for hemorrhage or mass.  Negative for fracture. Atherosclerotic disease. Prior eye surgery on the right.  CT CERVICAL SPINE FINDINGS  Disc degeneration and spondylosis throughout the cervical spine most severe at C3-4 and C4-5.  Diffuse facet degeneration. There is multilevel spinal stenosis most prominent at C3-4 and C4-5.  Negative for fracture or mass.  IMPRESSION: No acute intracranial abnormality  Cervical spondylosis.  Negative for cervical fracture.   Electronically Signed   By: Franchot Gallo M.D.   On: 06/04/2014 20:07   Dg Chest Portable 1 View  06/04/2014   CLINICAL DATA:  Weakness  EXAM: PORTABLE CHEST - 1 VIEW  COMPARISON:  03/06/2013  FINDINGS: COPD with pulmonary hyperinflation. Chronic scarring in the bases bilaterally is unchanged from prior studies. Negative for superimposed pneumonia or heart failure.  IMPRESSION: Chronic lung disease. No acute abnormality and no change from prior studies.   Electronically Signed   By: Franchot Gallo M.D.   On: 06/04/2014 18:26    Scheduled Meds: . antiseptic oral rinse  7 mL Mouth Rinse BID  . chlorhexidine  15 mL Mouth Rinse BID  . feeding supplement (ENSURE COMPLETE)  237 mL Oral TID  BM  . ferrous sulfate  325 mg Oral BID WC  . fluticasone  2 spray Each Nare Daily  . ipratropium-albuterol  3 mL Nebulization QID  . pantoprazole  40 mg Oral Q1200  . sodium chloride  3 mL Intravenous Q12H  . [START ON 06/10/2014] Vitamin D (Ergocalciferol)  50,000 Units Oral Q7 days   Continuous Infusions: . sodium chloride 75 mL/hr at 06/05/14 1447    Time spent: > 35 minutes    Velvet Bathe  [facility] Pager [phone] If 7PM-7AM, please contact night-coverage at [email or web address], password [email or web address] 06/06/2014, 3:10 PM  LOS: 2 days

## 2014-06-06 NOTE — ED Provider Notes (Signed)
Medical screening examination/treatment/procedure(s) were conducted as a shared visit with non-physician practitioner(s) and myself.  I personally evaluated the patient during the encounter.   EKG Interpretation   Date/Time:  Tuesday June 04 2014 17:37:10 EDT Ventricular Rate:  85 PR Interval:  149 QRS Duration: 95 QT Interval:  438 QTC Calculation: 521 R Axis:   174 Text Interpretation:  Sinus rhythm Biatrial enlargement Abnormal lateral Q  waves Probable anterior infarct, age indeterminate Prolonged QT interval  ED PHYSICIAN INTERPRETATION AVAILABLE IN [facility] Confirmed by  TEST, Record ([id number]) on 06/06/2014 7:42:31 AM        Tanna Furry, MD 06/06/14 [phone]

## 2014-06-07 LAB — BASIC METABOLIC PANEL
ANION GAP: 6 (ref 5–15)
Anion gap: 6 (ref 5–15)
BUN: 5 mg/dL — ABNORMAL LOW (ref 6–23)
BUN: 5 mg/dL — ABNORMAL LOW (ref 6–23)
CALCIUM: 8.3 mg/dL — AB (ref 8.4–10.5)
CHLORIDE: 94 meq/L — AB (ref 96–112)
CO2: 30 meq/L (ref 19–32)
CO2: 31 meq/L (ref 19–32)
Calcium: 8.5 mg/dL (ref 8.4–10.5)
Chloride: 95 mEq/L — ABNORMAL LOW (ref 96–112)
Creatinine, Ser: 0.76 mg/dL (ref 0.50–1.35)
Creatinine, Ser: 0.73 mg/dL (ref 0.50–1.35)
GFR calc Af Amer: >90 mL/min (ref >90)
GFR calc Af Amer: >90 mL/min (ref >90)
GFR calc non Af Amer: 86 mL/min — ABNORMAL LOW (ref >90)
GFR calc non Af Amer: 88 mL/min — ABNORMAL LOW (ref >90)
GLUCOSE: 84 mg/dL (ref 70–99)
Glucose, Bld: 93 mg/dL (ref 70–99)
POTASSIUM: 4.8 meq/L (ref 3.7–5.3)
Potassium: 4.9 mEq/L (ref 3.7–5.3)
Sodium: 131 mEq/L — ABNORMAL LOW (ref 137–147)
Sodium: 131 mEq/L — ABNORMAL LOW (ref 137–147)

## 2014-06-07 LAB — CBC
HEMATOCRIT: 20.8 % — AB (ref 39.0–52.0)
Hemoglobin: 7.2 g/dL — ABNORMAL LOW (ref 13.0–17.0)
MCH: 24.2 pg — ABNORMAL LOW (ref 26.0–34.0)
MCHC: 34.6 g/dL (ref 30.0–36.0)
MCV: 70 fL — AB (ref 78.0–100.0)
Platelets: 122 10*3/uL — ABNORMAL LOW (ref 150–400)
RBC: 2.97 MIL/uL — ABNORMAL LOW (ref 4.22–5.81)
RDW: 19.8 % — ABNORMAL HIGH (ref 11.5–15.5)
WBC: 4.5 10*3/uL (ref 4.0–10.5)

## 2014-06-07 LAB — GLUCOSE, CAPILLARY
GLUCOSE-CAPILLARY: 103 mg/dL — AB (ref 70–99)
GLUCOSE-CAPILLARY: 96 mg/dL (ref 70–99)
GLUCOSE-CAPILLARY: 91 mg/dL (ref 70–99)
GLUCOSE-CAPILLARY: 118 mg/dL — AB (ref 70–99)

## 2014-06-07 MED ORDER — IPRATROPIUM-ALBUTEROL 0.5-2.5 (3) MG/3ML IN SOLN
3.0000 mL | RESPIRATORY_TRACT | Status: DC | PRN
  Administered 2014-06-07: 3 mL via RESPIRATORY_TRACT
  Filled 2014-06-07: qty 3

## 2014-06-07 NOTE — Progress Notes (Addendum)
[facility] PROGRESS NOTE  Anthony [name] TIR:[id number] DOB: [birth year] DOA: 06/04/2014 PCP: Philis Fendt, MD  Assessment/Plan: Active Problems:   Hyponatremia - most likely due to poor oral solute intake given improvement with normal saline and history of poor oral intake. - Sodium steady around 131    Protein-calorie malnutrition, severe - RD on board - Pt on nutritional supplement    COPD - compensated, continue home regimen    Anemia - no active bleeding - obtain anemia panel next am - monitor cbc next am.    Hypoglycemia - suspect poor oral intake as cause - monitor cbg's  Code Status: full Family Communication: No family at bedside Disposition Plan: addendum: PT recommending SNF and patient agreeable   Consultants:  Speech therapy  PT  Procedures:  None  Antibiotics:  None  HPI/Subjective: Pt states that he is having trouble swallowing food. No acute issues reported overnight.  Objective: Filed Vitals:   06/07/14 1405  BP: 146/67  Pulse: 87  Temp: 98 F (36.7 C)  Resp: 18    Intake/Output Summary (Last 24 hours) at 06/07/14 1721 Last data filed at 06/07/14 1300  Gross per 24 hour  Intake   1308 ml  Output   1100 ml  Net    208 ml   Filed Weights   06/05/14 0024 06/06/14 0104 06/07/14 0452  Weight: 44.7 kg (98 lb 8.7 oz) 46.1 kg (101 lb 10.1 oz) 48.3 kg (106 lb 7.7 oz)    Exam:   General:  Pt in nad, alert and awake  Cardiovascular: rrr, no mrg  Respiratory: cta bl, no wheezes, prolonged exp. phase  Abdomen: soft, Nd, nt  Musculoskeletal: no cyanosis or clubbing   Data Reviewed: Basic Metabolic Panel:  Recent Labs Lab 06/05/14 0405 06/06/14 1409 06/06/14 2003 06/07/14 0202 06/07/14 0742  NA 122* 129* 131* 131* 131*  K 3.6* 4.3 4.3 4.9 4.8  CL 84* 93* 94* 95* 94*  CO2 30 31 32 30 31  GLUCOSE 130* 122* 87 84 93  BUN 12 6 5* 5* 5*  CREATININE 1.44* 0.81 0.81 0.76 0.73  CALCIUM 8.2* 8.2* 8.3* 8.3* 8.5    Liver Function Tests:  Recent Labs Lab 06/04/14 1745  AST 59*  ALT 11  ALKPHOS 69  BILITOT 1.0  PROT 7.1  ALBUMIN 3.2*   No results found for this basename: LIPASE, AMYLASE,  in the last 168 hours No results found for this basename: AMMONIA,  in the last 168 hours CBC:  Recent Labs Lab 06/04/14 1745 06/04/14 2039 06/05/14 0405 06/07/14 0202  WBC 4.2  --  4.3 4.5  NEUTROABS 2.5  --   --   --   HGB 8.4* 8.8* 7.9* 7.2*  HCT 24.8* 26.0* 22.3* 20.8*  MCV 70.3*  --  68.4* 70.0*  PLT 132*  --  142* 122*   Cardiac Enzymes: No results found for this basename: CKTOTAL, CKMB, CKMBINDEX, TROPONINI,  in the last 168 hours BNP (last 3 results) No results found for this basename: PROBNP,  in the last 8760 hours CBG:  Recent Labs Lab 06/06/14 1703 06/06/14 1731 06/06/14 2105 06/07/14 0734 06/07/14 1134  GLUCAP 69* 100* 125* 103* 96    No results found for this or any previous visit (from the past 240 hour(s)).   Studies: No results found.  Scheduled Meds: . antiseptic oral rinse  7 mL Mouth Rinse BID  . chlorhexidine  15 mL Mouth Rinse BID  . feeding supplement (ENSURE COMPLETE)  237 mL Oral TID BM  . ferrous sulfate  325 mg Oral BID WC  . fluticasone  2 spray Each Nare Daily  . ipratropium-albuterol  3 mL Nebulization QID  . pantoprazole  40 mg Oral Q1200  . sodium chloride  3 mL Intravenous Q12H  . [START ON 06/10/2014] Vitamin D (Ergocalciferol)  50,000 Units Oral Q7 days   Continuous Infusions: . sodium chloride 75 mL/hr at 06/07/14 0659    Time spent: > 35 minutes    Velvet Bathe  [facility] Pager [phone] If 7PM-7AM, please contact night-coverage at [email or web address], password [facility] 06/07/2014, 5:21 PM  LOS: 3 days

## 2014-06-07 NOTE — Progress Notes (Signed)
CARE MANAGEMENT NOTE 06/07/2014  Patient:  Anthony [name], Anthony [name]   Account Number:  [id number]  Date Initiated:  06/05/2014  Documentation initiated by:  [facility]  Subjective/Objective Assessment:   [address] [place].YQ:IHKV,QQ.     Action/Plan:   FROM HOME.HOME 02-[facility].   Anticipated DC Date:  06/10/2014   Anticipated DC Plan:  [place]  CM consult      Choice offered to / List presented to:  C-1 Patient           Status of service:  In process, will continue to follow Medicare Important Message given?   (If response is "NO", the following Medicare IM given date fields will be blank) Date Medicare IM given:   Medicare IM given by:   Date Additional Medicare IM given:   Additional Medicare IM given by:    Discharge Disposition:    Per UR Regulation:  Reviewed for med. necessity/level of care/duration of stay  If discussed at [place] of Stay Meetings, dates discussed:    Comments:  06/07/14 Terrion Gencarelli RN,BSN NCM [phone] PT-SNF.SW FOLLOWING.  06/05/14 Keaira Whitehurst RN,BSN NCM [phone] CONFIRMED PATIENT USES [facility] HOME 02,SHE IS AWARE PATIENT MAY NEED A TRAVEL [place] FOR HOME.DOES NOT HAVE TRAVEL HERE IN HOSPITAL, MAY NEED TRAVEL [place] @ D/C.PATIENT STATES HE WILL TRY TO HAVE FAMILY TO BRING IN HIS 02,BUT NOT SURE.MAY NEED TO CONTACT [facility] DME IF FAMILY UNABLE TO BRING IN.

## 2014-06-07 NOTE — Evaluation (Signed)
Physical Therapy Evaluation Patient Details Name: Anthony [name] MRN: [id number] DOB: [birth year] Today's Date: 06/07/2014   History of Present Illness  78 year old with PMHx of COPD, sickle cell disease who presented with weakness and decreased appetite and admitted 06/04/14 for hyponatremia and acute renal failure.  Clinical Impression  Pt admitted with poor oral intake and progressive weakness. Pt currently with functional limitations due to the deficits listed below (see PT Problem List).  Pt will benefit from skilled PT to increase their independence and safety with mobility to allow discharge to the venue listed below.  Pt unable to tolerate even ambulating around room and requested return to sitting.  Pt reports increased dyspnea with any activity.  Recommend 24/7 assist due to limited endurance, generalized weakness, and dyspnea on exertion therefore ST-SNF is recommended.     Follow Up Recommendations SNF    Equipment Recommendations  None recommended by PT    Recommendations for Other Services       Precautions / Restrictions Precautions Precautions: Fall Precaution Comments: monitor sats      Mobility  Bed Mobility               General bed mobility comments: pt up in recliner upon arrival however reports "a struggle" to perform bed mobility  Transfers Overall transfer level: Needs assistance Equipment used: Rolling walker (2 wheeled) Transfers: Sit to/from Stand Sit to Stand: Min guard         General transfer comment: usually locks rollator and pulls up with hands on walker so held walker down for pt to use  Ambulation/Gait Ambulation/Gait assistance: Min guard Ambulation Distance (Feet): 3 Feet Assistive device: Rolling walker (2 wheeled) Gait Pattern/deviations: Step-through pattern;Trunk flexed     General Gait Details: pt unable to perform extension, mainly with increased trunk flexion and forearms resting on RW, pt reports 4/4 dyspnea and  unable to tolerate farther ambulation  Stairs            Wheelchair Mobility    Modified Rankin (Stroke Patients Only)       Balance                                             Pertinent Vitals/Pain  No c/o pain, 4/4 dyspnea with activity, SpO2 98% on 3L O2 end of session, remained on 3L O2 throughout session    Home Living Family/patient expects to be discharged to:: Private residence Living Arrangements: Alone Available Help at Discharge: Personal care attendant (5 days/wk for 3.5 hrs/day) Type of Home: Apartment Home Access: Level entry     Home Layout: One level Home Equipment: Walker - 4 wheels      Prior Function Level of Independence: Needs assistance   Gait / Transfers Assistance Needed: usually uses rollator and on 2L O2 [place]           Hand Dominance        Extremity/Trunk Assessment               Lower Extremity Assessment: Generalized weakness         Communication   Communication: No difficulties  Cognition Arousal/Alertness: Awake/alert Behavior During Therapy: WFL for tasks assessed/performed Overall Cognitive Status: Within Functional Limits for tasks assessed                      General Comments  Exercises        Assessment/Plan    PT Assessment Patient needs continued PT services  PT Diagnosis Difficulty walking   PT Problem List Decreased strength;Decreased activity tolerance;Decreased mobility;Cardiopulmonary status limiting activity  PT Treatment Interventions DME instruction;Gait training;Functional mobility training;Patient/family education;Therapeutic activities;Therapeutic exercise   PT Goals (Current goals can be found in the Care Plan section) Acute Rehab PT Goals PT Goal Formulation: With patient Time For Goal Achievement: 06/14/14 Potential to Achieve Goals: Good    Frequency Min 3X/week   Barriers to discharge        Co-evaluation               End of  Session Equipment Utilized During Treatment: Oxygen Activity Tolerance: Patient limited by fatigue;Other (comment) (limited by dyspnea) Patient left: in chair;with call bell/phone within reach           Time: 0847-0903 PT Time Calculation (min): 16 min   Charges:   PT Evaluation $Initial PT Evaluation Tier I: 1 Procedure PT Treatments $Therapeutic Activity: 8-22 mins   PT G Codes:          Kyland No,KATHrine E 06/07/2014, 9:20 AM Carmelia Bake, PT, DPT 06/07/2014 Pager: [phone]

## 2014-06-07 NOTE — Evaluation (Addendum)
Clinical/Bedside Swallow Evaluation Patient Details  Name: Anthony [name] MRN: [id number] Date of Birth: 1936/08/12  Today's Date: 06/07/2014 Time: 1225-1301 SLP Time Calculation (min): 36 min  Past Medical History:  Past Medical History  Diagnosis Date  . COPD (chronic obstructive pulmonary disease)   . Sickle cell anemia   . Pulmonary embolism    Past Surgical History:  Past Surgical History  Procedure Laterality Date  . No past surgeries    . Colonoscopy N/A 03/09/2013    Procedure: COLONOSCOPY;  Surgeon: Beryle Beams, MD;  Location: [facility];  Service: Endoscopy;  Laterality: N/A;  . Esophagogastroduodenoscopy N/A 03/09/2013    Procedure: ESOPHAGOGASTRODUODENOSCOPY (EGD);  Surgeon: Beryle Beams, MD;  Location: [facility] ENDOSCOPY;  Service: Endoscopy;  Laterality: N/A;   HPI:  78 yo male adm to [facility] with hyponatremia.  Pt with PMH + for COPD, sickle cell anemia, and pulmonary embolism.  He reports dysphgia to solid foods sensing stasis in phayrnx.  Pt also reported early satiety.  Swallow evaluation ordered.  H/o gastritis but normal esophagus per endoscopy March 2014.     Assessment / Plan / Recommendation Clinical Impression  Pt presents with mild oral dysphagia due to weakness and poor dentition and suspect mild esophageal deficits due to poor positioning *(leaning over - negatively impacting esophageal clearance). Educated pt to importance of trying to sit upright to faciliate clearance.    Pt unable to masticate large piece of meat, expectorating partially masticated bolus.  Pharyngeal swallow may be mildly delayed but no overt indications of airway compromise with small boluses.  Pt with NO complaint of pharyngeal stasis with boluses observed *magic cup, mashed potatoes, beef, water.   He reports that he feels like food "balls up in his throat", SLP questions if could be referent sensation from distal esophageal stasis.  Advised pt to compensations to facilitate clearance.  He  only took a few bites/sips of po before stating he was finished due to feeling full.   SLP observed pt consuming pill with water from RN with good tolerance.  Recommend regular diet with ground meats to ease swallowing.  ? If pt would benefit from following up with GI given his h/o gastritis and possible current issue   SLP educated pt to precautions/compensation to mitigate dysphagia using teach back for reinforcment.  Suspect lack of intake multifactorial with dysphagia as a minimal factor.   Spoke to dietician Judson Roch re: pt's enjoyment of magic cup.  Will sign off, please reorder if desire.     Aspiration Risk  Mild    Diet Recommendation Regular;Thin liquid (ground meat)   Liquid Administration via: Straw;Cup Medication Administration: Whole meds with liquid Supervision: Patient able to self feed Compensations: Slow rate;Small sips/bites (drink liquids t/o meal, dry swallows to aid clearance as indicated) Postural Changes and/or Swallow Maneuvers: Seated upright 90 degrees;Upright 30-60 min after meal    Other  Recommendations Oral Care Recommendations: Oral care BID   Follow Up Recommendations  None    Frequency and Duration        Pertinent Vitals/Pain Afebrile, decreased      Swallow Study Prior Functional Status   see [place] Date of Onset: 06/07/14 HPI: 78 yo male adm to [facility] with hyponatremia.  Pt with PMH + for COPD, sickle cell anemia, and pulmonary embolism.  He reports dysphgia to solid foods sensing stasis in phayrnx.  Pt also reported early satiety.  Swallow evaluation ordered.  H/o gastritis but normal esophagus  per endoscopy March 2014.   Diet Prior to this Study: Regular Temperature Spikes Noted: No Respiratory Status: Nasal cannula History of Recent Intubation: No Behavior/Cognition: Alert;Cooperative;Pleasant mood Oral Cavity - Dentition: Poor condition Patient Positioning: Postural control interferes with function (pt leans forward, kyphotic -  negatively impacting clearance of esophagus) Baseline Vocal Quality: Clear Volitional Cough: Strong Volitional Swallow: Able to elicit    Oral/Motor/Sensory Function Overall Oral Motor/Sensory Function:  (generalized weakness but no focal cn deficits)   Ice Chips Ice chips: Not tested   Thin Liquid Thin Liquid: Impaired Presentation: Straw Pharyngeal  Phase Impairments: Cough - Immediate Other Comments: large boluses resulted in cough after swallow, small boluses tolerated well    Nectar Thick Nectar Thick Liquid: Not tested   Honey Thick Honey Thick Liquid: Not tested   Puree Puree: Within functional limits Presentation: Spoon   Solid   GO    Solid: Impaired Oral Phase Impairments: Reduced lingual movement/coordination;Impaired anterior to posterior transit (pt expectorated piece of meat that he could not masticate) Oral Phase Functional Implications: Oral holding (delayed oral transiting) Other Comments: pt did not report sensation of pharyngeal stasis       Claudie Fisherman, [place] [facility] SLP [phone]

## 2014-06-07 NOTE — Progress Notes (Signed)
NUTRITION FOLLOW UP  Intervention:   -Recommend MagicCup TID w/meals -Continue with Ensure Complete TID -Assisted with meal ordering and encouraged PO intake -Pt to be d/c'd to SNF, recommend pt continue with supplement regimen upon d/c for weight maintenance/continued nutrition replenishment  Nutrition Dx:   Inadequate oral intake related to early satiety as evidenced by PO intake < 75%, 11% body weight loss in one month; continues   Goal:   Pt to meet >/= 90% of their estimated nutrition needs; not met  Monitor:   Total protein/energy intake, labs, weights, swallow profile, supplement acceptance  Assessment:   8/05: -Pt reported an progressive weight loss of 65 lbs since 2009. Has experience 12 lbs weight loss in one month (11% body weight, severe for time frame)  -Endorses feelings of weakness and early satiety that contribute to decreased appetite  -Diet recall indicates pt consuming three small meals/day. Breakfast consists of cereal or eggs, lunch typically sandwich with cold cuts, and dinner is supplied by Meals on Wheels program. Drinks one Ensure Complete supplement daily  -Has had minimal PO intake during admit. Was willing to try to consume Ensure TID and promoted intake of 8PM snack of crackers w/peanut butter and milk (to be provided by nourishments on unit)  -MD expressed concern for possible gastritis vs gastric cancer.  -Pt with evident severe muscle wasting and subcutaneous fat loss  8/06: -Pt discussed during multidisciplinary rounds. RN noted pt with continued decreased PO intake. -Pt with minimal intake of breakfast tray during time of RD follow up. Offered alternative options, pt requested chicken noodle soup. Provided pt with soup, crackers,and whole milk. Provided additional soup to be available for pt on unit kitchen.  -Pt consuming approximately one Ensure daily. Continue to recommend and encouraged PO intake  8/07: -SLP contacted RD regarding bedside swallow  eval results. Noted that pt enjoyed MagicCup supplement that was used during evaluation. Will order with meals. -SLP recommended ground meats with gravy for pt d/t poor dentition and ease swallowing. Also noted pt only consuming Ensure once daily d/t financial reasons -NT reported pt ate well at lunch, approximately 50%. Was able to tolerate pork w/gravy, mashed potatoes, and shebert. -Per MD, pt will be d/c to SNF once stable. Will recommend to continue with supplement regimens upon d/c for nutrient replenishment. Pt willing to try other supplements available at SNF. -Followed up with patient for additional nutrition concerns. Pt in agreement to consume MagicCup and Ensure, and verbalized understanding in importance of appetite/PO intake.   Height: Ht Readings from Last 1 Encounters:  06/05/14 5' 10"  (1.778 m)    Weight Status:   Wt Readings from Last 1 Encounters:  06/07/14 106 lb 7.7 oz (48.3 kg)  06/05/14 98 lb  Re-estimated needs:  Kcal: 1550-1750  Protein: 80-95 gram  Fluid: >/=1600 ml/daily   Skin: WDL  Diet Order: General   Intake/Output Summary (Last 24 hours) at 06/07/14 1407 Last data filed at 06/07/14 0939  Gross per 24 hour  Intake   1188 ml  Output   1100 ml  Net     88 ml    Last BM: 8/03   Labs:   Recent Labs Lab 06/06/14 2003 06/07/14 0202 06/07/14 0742  NA 131* 131* 131*  K 4.3 4.9 4.8  CL 94* 95* 94*  CO2 32 30 31  BUN 5* 5* 5*  CREATININE 0.81 0.76 0.73  CALCIUM 8.3* 8.3* 8.5  GLUCOSE 87 84 93    CBG (last 3)  Recent Labs  06/06/14 2105 06/07/14 0734 06/07/14 1134  GLUCAP 125* 103* 96    Scheduled Meds: . antiseptic oral rinse  7 mL Mouth Rinse BID  . chlorhexidine  15 mL Mouth Rinse BID  . feeding supplement (ENSURE COMPLETE)  237 mL Oral TID BM  . ferrous sulfate  325 mg Oral BID WC  . fluticasone  2 spray Each Nare Daily  . ipratropium-albuterol  3 mL Nebulization QID  . pantoprazole  40 mg Oral Q1200  . sodium  chloride  3 mL Intravenous Q12H  . [START ON 06/10/2014] Vitamin D (Ergocalciferol)  50,000 Units Oral Q7 days    Continuous Infusions: . sodium chloride 75 mL/hr at 06/07/14 [place] LDN Clinical Dietitian [phone]

## 2014-06-08 LAB — IRON AND TIBC
Iron: 79 ug/dL (ref 42–135)
SATURATION RATIOS: 40 % (ref 20–55)
TIBC: 199 ug/dL — ABNORMAL LOW (ref 215–435)
UIBC: 120 ug/dL — ABNORMAL LOW (ref 125–400)

## 2014-06-08 LAB — CBC
HEMATOCRIT: 21.3 % — AB (ref 39.0–52.0)
Hemoglobin: 7.1 g/dL — ABNORMAL LOW (ref 13.0–17.0)
MCH: 23.7 pg — ABNORMAL LOW (ref 26.0–34.0)
MCHC: 33.3 g/dL (ref 30.0–36.0)
MCV: 71 fL — ABNORMAL LOW (ref 78.0–100.0)
PLATELETS: 127 10*3/uL — AB (ref 150–400)
RBC: 3 MIL/uL — ABNORMAL LOW (ref 4.22–5.81)
RDW: 19.5 % — AB (ref 11.5–15.5)
WBC: 4.8 10*3/uL (ref 4.0–10.5)

## 2014-06-08 LAB — GLUCOSE, CAPILLARY
GLUCOSE-CAPILLARY: 130 mg/dL — AB (ref 70–99)
GLUCOSE-CAPILLARY: 116 mg/dL — AB (ref 70–99)
Glucose-Capillary: 77 mg/dL (ref 70–99)
Glucose-Capillary: 85 mg/dL (ref 70–99)

## 2014-06-08 LAB — RETICULOCYTES
RBC.: 2.82 MIL/uL — AB (ref 4.22–5.81)
Retic Ct Pct: <0.4 % — ABNORMAL LOW (ref 0.4–3.1)

## 2014-06-08 MED ORDER — IPRATROPIUM-ALBUTEROL 20-100 MCG/ACT IN AERS: 1.0000 | INHALATION_SPRAY | Freq: Four times a day (QID) | RESPIRATORY_TRACT | Status: DC | PRN

## 2014-06-08 MED ORDER — IPRATROPIUM-ALBUTEROL 0.5-2.5 (3) MG/3ML IN SOLN
3.0000 mL | RESPIRATORY_TRACT | Status: DC | PRN
  Administered 2014-06-08 – 2014-06-10 (×2): 3 mL via RESPIRATORY_TRACT
  Filled 2014-06-08 (×2): qty 3

## 2014-06-08 MED ORDER — HYDROXYZINE HCL 10 MG PO TABS
10.0000 mg | ORAL_TABLET | Freq: Three times a day (TID) | ORAL | Status: DC | PRN
  Administered 2014-06-09 (×2): 10 mg via ORAL
  Filled 2014-06-08 (×2): qty 1

## 2014-06-08 MED ORDER — IPRATROPIUM-ALBUTEROL 0.5-2.5 (3) MG/3ML IN SOLN
3.0000 mL | Freq: Four times a day (QID) | RESPIRATORY_TRACT | Status: DC
  Administered 2014-06-08 – 2014-06-10 (×9): 3 mL via RESPIRATORY_TRACT
  Filled 2014-06-08 (×9): qty 3

## 2014-06-08 MED ORDER — IPRATROPIUM-ALBUTEROL 20-100 MCG/ACT IN AERS
1.0000 | INHALATION_SPRAY | Freq: Four times a day (QID) | RESPIRATORY_TRACT | Status: DC | PRN
  Filled 2014-06-08: qty 4

## 2014-06-08 MED ORDER — IPRATROPIUM-ALBUTEROL 0.5-2.5 (3) MG/3ML IN SOLN: 3.0000 mL | Freq: Four times a day (QID) | RESPIRATORY_TRACT | Status: DC | PRN

## 2014-06-08 NOTE — Progress Notes (Signed)
[facility] PROGRESS NOTE  FREDRICO BEEDLE KVQ:[id number] DOB: July 05, 1936 DOA: 06/04/2014 PCP: Philis Fendt, MD  Assessment/Plan: Active Problems:   Hyponatremia - most likely due to poor oral solute intake given improvement with normal saline and history of poor oral intake. - Sodium steady around 131 on last check    Protein-calorie malnutrition, severe - RD on board - Pt on nutritional supplement    COPD - compensated, continue home regimen    Anemia - no active bleeding - monitor cbc next am. - fecal occult blood negative - anemia panel    Hypoglycemia - suspect poor oral intake as cause - monitor cbg's  Code Status: full Family Communication: No family at bedside Disposition Plan: SNF   Consultants:  Speech therapy  PT  Procedures:  None  Antibiotics:  None  HPI/Subjective: No new problems reported. No acute issues reported overnight.  Objective: Filed Vitals:   06/08/14 1403  BP: 105/37  Pulse: 97  Temp: 98.4 F (36.9 C)  Resp: 18    Intake/Output Summary (Last 24 hours) at 06/08/14 1530 Last data filed at 06/08/14 1500  Gross per 24 hour  Intake   2100 ml  Output   1800 ml  Net    300 ml   Filed Weights   06/06/14 0104 06/07/14 0452 06/08/14 0526  Weight: 46.1 kg (101 lb 10.1 oz) 48.3 kg (106 lb 7.7 oz) 48.1 kg (106 lb 0.7 oz)    Exam:   General:  Pt in nad, alert and awake  Cardiovascular: rrr, no mrg  Respiratory: cta bl, no wheezes, prolonged exp. phase  Abdomen: soft, Nd, nt  Musculoskeletal: no cyanosis or clubbing   Data Reviewed: Basic Metabolic Panel:  Recent Labs Lab 06/05/14 0405 06/06/14 1409 06/06/14 2003 06/07/14 0202 06/07/14 0742  NA 122* 129* 131* 131* 131*  K 3.6* 4.3 4.3 4.9 4.8  CL 84* 93* 94* 95* 94*  CO2 30 31 32 30 31  GLUCOSE 130* 122* 87 84 93  BUN 12 6 5* 5* 5*  CREATININE 1.44* 0.81 0.81 0.76 0.73  CALCIUM 8.2* 8.2* 8.3* 8.3* 8.5   Liver Function Tests:  Recent  Labs Lab 06/04/14 1745  AST 59*  ALT 11  ALKPHOS 69  BILITOT 1.0  PROT 7.1  ALBUMIN 3.2*   No results found for this basename: LIPASE, AMYLASE,  in the last 168 hours No results found for this basename: AMMONIA,  in the last 168 hours CBC:  Recent Labs Lab 06/04/14 1745 06/04/14 2039 06/05/14 0405 06/07/14 0202 06/08/14 0500  WBC 4.2  --  4.3 4.5 4.8  NEUTROABS 2.5  --   --   --   --   HGB 8.4* 8.8* 7.9* 7.2* 7.1*  HCT 24.8* 26.0* 22.3* 20.8* 21.3*  MCV 70.3*  --  68.4* 70.0* 71.0*  PLT 132*  --  142* 122* 127*   Cardiac Enzymes: No results found for this basename: CKTOTAL, CKMB, CKMBINDEX, TROPONINI,  in the last 168 hours BNP (last 3 results) No results found for this basename: PROBNP,  in the last 8760 hours CBG:  Recent Labs Lab 06/07/14 1134 06/07/14 1719 06/07/14 2137 06/08/14 0719 06/08/14 1142  GLUCAP 96 91 118* 77 85    No results found for this or any previous visit (from the past 240 hour(s)).   Studies: No results found.  Scheduled Meds: . antiseptic oral rinse  7 mL Mouth Rinse BID  . chlorhexidine  15 mL Mouth Rinse BID  . feeding  supplement (ENSURE COMPLETE)  237 mL Oral TID BM  . ferrous sulfate  325 mg Oral BID WC  . fluticasone  2 spray Each Nare Daily  . ipratropium-albuterol  3 mL Nebulization QID  . pantoprazole  40 mg Oral Q1200  . sodium chloride  3 mL Intravenous Q12H  . [START ON 06/10/2014] Vitamin D (Ergocalciferol)  50,000 Units Oral Q7 days   Continuous Infusions: . sodium chloride 75 mL/hr at 06/08/14 1023    Time spent: > 35 minutes    Velvet Bathe  [facility] Pager [phone] If 7PM-7AM, please contact night-coverage at [email or web address], password [facility] 06/08/2014, 3:30 PM  LOS: 4 days

## 2014-06-08 NOTE — Clinical Social Work Placement (Signed)
Clinical Social Work Department CLINICAL SOCIAL WORK PLACEMENT NOTE 06/08/2014  Patient:  Anthony [name], Anthony [name]  Account Number:  [id number] Admit date:  06/04/2014  Clinical Social Worker:  Lovey Newcomer  Date/time:  06/08/2014 02:54 PM  Clinical Social Work is seeking post-discharge placement for this patient at the following level of care:   [place]   (*CSW will update this form in Epic as items are completed)   06/08/2014  Patient/family provided with [place] Department of Clinical Social Work's list of facilities offering this level of care within the geographic area requested by the patient (or if unable, by the patient's family).  06/08/2014  Patient/family informed of their freedom to choose among providers that offer the needed level of care, that participate in Medicare, Medicaid or managed care program needed by the patient, have an available bed and are willing to accept the patient.  06/08/2014  Patient/family informed of [facility]' ownership interest in [facility], as well as of the fact that they are under no obligation to receive care at this facility.  PASARR submitted to EDS on 06/08/2014 PASARR number received on 06/08/2014  FL2 transmitted to all facilities in geographic area requested by pt/family on  06/08/2014 FL2 transmitted to all facilities within larger geographic area on   Patient informed that his/her managed care company has contracts with or will negotiate with  certain facilities, including the following:     Patient/family informed of bed offers received:   Patient chooses bed at  Physician recommends and patient chooses bed at    Patient to be transferred to  on   Patient to be transferred to facility by  Patient and family notified of transfer on  Name of family member notified:    The following physician request were entered in Epic:   Additional Comments: [place] NOTE 06/08/2014  Patient:  Anthony [name], Anthony [name]  Account Number:  [id number] [place] date:  06/04/2014  Clinical Social Worker:  Lovey Newcomer  Date/time:  06/08/2014 02:54 PM  Clinical Social Work is seeking post-discharge placement for this patient at the following level of care:   [place]   (*CSW will update this form in Epic as items are completed)   06/08/2014  Patient/family provided with [place] Department of Clinical Social Work's list of facilities offering this level of care within the geographic area requested by the patient (or if unable, by the patient's family).  06/08/2014  Patient/family informed of their freedom to choose among providers that offer the needed level of care, that participate in Medicare, Medicaid or managed care program needed by the patient, have an available bed and are willing to accept the patient.  06/08/2014  Patient/family informed of [facility]' ownership interest in [facility], as well as of the fact that they are under no obligation to receive care at this facility.  PASARR submitted to EDS on 06/08/2014 PASARR number received on 06/08/2014  FL2 transmitted to all facilities in geographic area requested by pt/family on  06/08/2014 FL2 transmitted to all facilities within larger geographic area on   Patient informed that his/her managed care company has contracts with or will negotiate with  certain facilities, including the following:     Patient/family informed of bed offers received:   Patient chooses bed at  Physician recommends and patient chooses bed at    Patient to be transferred to  on   Patient  to be transferred to facility by  Patient and family notified of transfer on  Name of family member notified:    The following physician request were entered in Epic:   Additional Comments:    Liz Beach MSW, [place], [place], [phone]

## 2014-06-08 NOTE — Clinical Social Work Note (Signed)
FL2 placed on patient's chart for MD signature.   Liz Beach MSW, [place], [place], [phone]

## 2014-06-08 NOTE — Clinical Social Work Psychosocial (Signed)
Clinical Social Work Department BRIEF PSYCHOSOCIAL ASSESSMENT 06/08/2014  Patient:  Anthony [name], Anthony [name]     Account Number:  [id number]     Admit date:  06/04/2014  Clinical Social Worker:  Lovey Newcomer  Date/Time:  06/08/2014 02:48 PM  Referred by:  Physician  Date Referred:  06/08/2014 Referred for  SNF Placement   Other Referral:   Interview type:  Patient Other interview type:   Patient and son interviewed at bedside to complete assessment.    PSYCHOSOCIAL DATA Living Status:  ALONE Admitted from facility:   Level of care:   Primary support name:  [name] Primary support relationship to patient:  CHILD, ADULT Degree of support available:   Support is good.    CURRENT CONCERNS Current Concerns  Post-Acute Placement   Other Concerns:    SOCIAL WORK ASSESSMENT / PLAN CSW met with patient and patient's son [name] at bedside to complete assessment. Patient currently lives alone in his own home and has had increasing difficulty with his breathing and mobility recently. Patient and son are agreeable to SNF placement at discharge for short term rehab. CSW explained SNF search/placement process and answered patient's quesitons. CSW explained that patient and family may need to may decision on SNF quickly as the patient may need to discharge soon. Patient sat on bed slouched over during most of the assessment, but would speak to CSW. Patient and son thanked CSW for assistance.   Assessment/plan status:  Psychosocial Support/Ongoing Assessment of Needs Other assessment/ plan:   Complete Fl2, Fax, PASRR   Information/referral to community resources:   CSW contact information and SNF list given.    PATIENT'S/FAMILY'S RESPONSE TO PLAN OF CARE: Paitent and son plan for son to DC to SNF when ready. CSW will follow up with available bed offers.       Liz Beach MSW, [place], [place], [phone]

## 2014-06-09 LAB — BASIC METABOLIC PANEL
Anion gap: 2 — ABNORMAL LOW (ref 5–15)
BUN: 7 mg/dL (ref 6–23)
CO2: 38 mEq/L — ABNORMAL HIGH (ref 19–32)
CREATININE: 0.74 mg/dL (ref 0.50–1.35)
Calcium: 8.4 mg/dL (ref 8.4–10.5)
Chloride: 94 mEq/L — ABNORMAL LOW (ref 96–112)
GFR calc Af Amer: >90 mL/min (ref >90)
GFR, EST NON AFRICAN AMERICAN: 87 mL/min — AB (ref >90)
GLUCOSE: 94 mg/dL (ref 70–99)
POTASSIUM: 5.2 meq/L (ref 3.7–5.3)
Sodium: 134 mEq/L — ABNORMAL LOW (ref 137–147)

## 2014-06-09 LAB — CBC
HCT: 20.5 % — ABNORMAL LOW (ref 39.0–52.0)
Hemoglobin: 6.9 g/dL — CL (ref 13.0–17.0)
MCH: 24 pg — ABNORMAL LOW (ref 26.0–34.0)
MCHC: 33.7 g/dL (ref 30.0–36.0)
MCV: 71.4 fL — AB (ref 78.0–100.0)
PLATELETS: 140 10*3/uL — AB (ref 150–400)
RBC: 2.87 MIL/uL — AB (ref 4.22–5.81)
RDW: 19.4 % — ABNORMAL HIGH (ref 11.5–15.5)
WBC: 3.8 10*3/uL — ABNORMAL LOW (ref 4.0–10.5)

## 2014-06-09 LAB — VITAMIN B12: Vitamin B-12: 641 pg/mL (ref 211–911)

## 2014-06-09 LAB — PREPARE RBC (CROSSMATCH)

## 2014-06-09 LAB — GLUCOSE, CAPILLARY
GLUCOSE-CAPILLARY: 85 mg/dL (ref 70–99)
GLUCOSE-CAPILLARY: 98 mg/dL (ref 70–99)
Glucose-Capillary: 103 mg/dL — ABNORMAL HIGH (ref 70–99)

## 2014-06-09 LAB — FERRITIN: Ferritin: 97 ng/mL (ref 22–322)

## 2014-06-09 LAB — FOLATE: Folate: 7.9 ng/mL

## 2014-06-09 MED ORDER — SODIUM CHLORIDE 0.9 % IV SOLN
Freq: Once | INTRAVENOUS | Status: AC
  Administered 2014-06-09: 15:00:00 via INTRAVENOUS

## 2014-06-09 MED ORDER — FUROSEMIDE 10 MG/ML IJ SOLN
20.0000 mg | Freq: Once | INTRAMUSCULAR | Status: AC
  Administered 2014-06-09: 20 mg via INTRAVENOUS
  Filled 2014-06-09: qty 2

## 2014-06-09 NOTE — Progress Notes (Signed)
[facility] PROGRESS NOTE  Anthony [name]:[id number] DOB: 06/05/1936 DOA: 06/04/2014 PCP: Philis Fendt, MD  Assessment/Plan: Active Problems:   Hyponatremia - most likely due to poor oral solute intake given improvement with normal saline and history of poor oral intake. - Sodium steady around 134 on last check    Protein-calorie malnutrition, severe - RD on board - Pt on nutritional supplement    COPD - compensated, continue home regimen    Anemia - no active bleeding - Will administer 2 units of PRBC and reassess next am. - fecal occult blood negative - anemia panel reviewed    Hypoglycemia - suspect poor oral intake as cause - monitor cbg's  Code Status: full Family Communication: No family at bedside Disposition Plan: SNF   Consultants:  Speech therapy  PT  Procedures:  None  Antibiotics:  None  HPI/Subjective: No new problems reported. No acute issues reported overnight.  Objective: Filed Vitals:   06/09/14 1530  BP: 119/50  Pulse: 96  Temp: 98.2 F (36.8 C)  Resp: 18    Intake/Output Summary (Last 24 hours) at 06/09/14 1555 Last data filed at 06/09/14 1530  Gross per 24 hour  Intake 2017.5 ml  Output   1825 ml  Net  192.5 ml   Filed Weights   06/07/14 0452 06/08/14 0526 06/09/14 0451  Weight: 48.3 kg (106 lb 7.7 oz) 48.1 kg (106 lb 0.7 oz) 48 kg (105 lb 13.1 oz)    Exam:   General:  Pt in nad, alert and awake  Cardiovascular: rrr, no mrg  Respiratory: cta bl, no wheezes, prolonged exp. phase  Abdomen: soft, Nd, nt  Musculoskeletal: no cyanosis or clubbing   Data Reviewed: Basic Metabolic Panel:  Recent Labs Lab 06/06/14 1409 06/06/14 2003 06/07/14 0202 06/07/14 0742 06/09/14 0015  NA 129* 131* 131* 131* 134*  K 4.3 4.3 4.9 4.8 5.2  CL 93* 94* 95* 94* 94*  CO2 31 32 30 31 38*  GLUCOSE 122* 87 84 93 94  BUN 6 5* 5* 5* 7  CREATININE 0.81 0.81 0.76 0.73 0.74  CALCIUM 8.2* 8.3* 8.3* 8.5 8.4    Liver Function Tests:  Recent Labs Lab 06/04/14 1745  AST 59*  ALT 11  ALKPHOS 69  BILITOT 1.0  PROT 7.1  ALBUMIN 3.2*   No results found for this basename: LIPASE, AMYLASE,  in the last 168 hours No results found for this basename: AMMONIA,  in the last 168 hours CBC:  Recent Labs Lab 06/04/14 1745 06/04/14 2039 06/05/14 0405 06/07/14 0202 06/08/14 0500 06/09/14 1048  WBC 4.2  --  4.3 4.5 4.8 3.8*  NEUTROABS 2.5  --   --   --   --   --   HGB 8.4* 8.8* 7.9* 7.2* 7.1* 6.9*  HCT 24.8* 26.0* 22.3* 20.8* 21.3* 20.5*  MCV 70.3*  --  68.4* 70.0* 71.0* 71.4*  PLT 132*  --  142* 122* 127* 140*   Cardiac Enzymes: No results found for this basename: CKTOTAL, CKMB, CKMBINDEX, TROPONINI,  in the last 168 hours BNP (last 3 results) No results found for this basename: PROBNP,  in the last 8760 hours CBG:  Recent Labs Lab 06/08/14 1142 06/08/14 1711 06/08/14 2003 06/09/14 0753 06/09/14 1120  GLUCAP 85 130* 116* 85 98    No results found for this or any previous visit (from the past 240 hour(s)).   Studies: No results found.  Scheduled Meds: . antiseptic oral rinse  7 mL Mouth Rinse  BID  . chlorhexidine  15 mL Mouth Rinse BID  . feeding supplement (ENSURE COMPLETE)  237 mL Oral TID BM  . ferrous sulfate  325 mg Oral BID WC  . fluticasone  2 spray Each Nare Daily  . furosemide  20 mg Intravenous Once  . ipratropium-albuterol  3 mL Nebulization QID  . pantoprazole  40 mg Oral Q1200  . sodium chloride  3 mL Intravenous Q12H  . [START ON 06/10/2014] Vitamin D (Ergocalciferol)  50,000 Units Oral Q7 days   Continuous Infusions: . sodium chloride 75 mL/hr at 06/08/14 2312    Time spent: > 35 minutes    Velvet Bathe  [facility] Pager [phone] If 7PM-7AM, please contact night-coverage at [email or web address], password [facility] 06/09/2014, 3:55 PM  LOS: 5 days

## 2014-06-09 NOTE — Progress Notes (Signed)
CRITICAL VALUE ALERT  Critical value received:  HGB 6.9  Date of notification:  06/09/2014  Time of notification:  1103  Critical value read back:Yes.    Nurse who received alert:  A. Burkes, RN  MD notified (1st page):  Wendee Beavers  Time of first page:  1135  MD notified (2nd page):  Time of second page:  Responding MD:  Wendee Beavers  Time MD responded:  1594

## 2014-06-10 LAB — CBC
HCT: 28.9 % — ABNORMAL LOW (ref 39.0–52.0)
Hemoglobin: 9.8 g/dL — ABNORMAL LOW (ref 13.0–17.0)
MCH: 25.3 pg — ABNORMAL LOW (ref 26.0–34.0)
MCHC: 33.9 g/dL (ref 30.0–36.0)
MCV: 74.7 fL — ABNORMAL LOW (ref 78.0–100.0)
Platelets: 141 K/uL — ABNORMAL LOW (ref 150–400)
RBC: 3.87 MIL/uL — ABNORMAL LOW (ref 4.22–5.81)
RDW: 19.2 % — ABNORMAL HIGH (ref 11.5–15.5)
WBC: 5 K/uL (ref 4.0–10.5)

## 2014-06-10 LAB — TYPE AND SCREEN
ABO/RH(D): A POS
Antibody Screen: NEGATIVE
Unit division: 0
Unit division: 0

## 2014-06-10 LAB — GLUCOSE, CAPILLARY
GLUCOSE-CAPILLARY: 88 mg/dL (ref 70–99)
Glucose-Capillary: 133 mg/dL — ABNORMAL HIGH (ref 70–99)

## 2014-06-10 MED ORDER — IPRATROPIUM-ALBUTEROL 0.5-2.5 (3) MG/3ML IN SOLN: 3.0000 mL | Freq: Four times a day (QID) | RESPIRATORY_TRACT | Status: DC

## 2014-06-10 NOTE — Discharge Summary (Signed)
Physician Discharge Summary  Anthony [name] ZOX:[id number] DOB: 01/29/1936 DOA: 06/04/2014  PCP: Philis Fendt, MD  Admit date: 06/04/2014 Discharge date: 06/10/2014  Time spent: > 35 minutes  Recommendations for Outpatient Follow-up:  1. Please be sure to follow up with the primary care physician in 1-2 weeks at SNF 2. Pt is to continue Physical therapy services while at SNF 3. Monitor cbc next week. 4. Monitor sodium levels  Discharge Diagnoses:  Active Problems:   Hyponatremia   Protein-calorie malnutrition, severe   COPD   Anemia   Hypoglycemia   Discharge Condition: stable  Diet recommendation: Low sodium heart healthy  Filed Weights   06/08/14 0526 06/09/14 0451 06/10/14 0500  Weight: 48.1 kg (106 lb 0.7 oz) 48 kg (105 lb 13.1 oz) 48.1 kg (106 lb 0.7 oz)    History of present illness:  Pt is 78 yo male who presented to [facility] ED with main complaint of several weeks duration of progressive weakness, poor oral intake, weight loss.  Hospital Course:   Active Problems:  Hyponatremia  - most likely due to poor oral solute intake given improvement with normal saline and history of poor oral intake.  - Sodium steady around 134 on last check   Protein-calorie malnutrition, severe  - RD on board while pt in house - Pt on nutritional supplement   COPD  - compensated, continue home regimen   Anemia  - no active bleeding  - pt is s/p transfusion 2 units of prbc - fecal occult blood negative  - anemia panel reviewed   Hypoglycemia  - suspect poor oral intake as cause  - resolved  Procedures:  None  Consultations:  None  Discharge Exam: Filed Vitals:   06/10/14 0500  BP: 111/56  Pulse: 100  Temp: 97.2 F (36.2 C)  Resp: 20    General: pt in nad, alert and awake Cardiovascular: rrr, no mrg Respiratory: cta bl, no wheezes  Discharge Instructions You were cared for by a hospitalist during your hospital stay. If you have any questions about your  discharge medications or the care you received while you were in the hospital after you are discharged, you can call the unit and asked to speak with the hospitalist on call if the hospitalist that took care of you is not available. Once you are discharged, your primary care physician will handle any further medical issues. Please note that NO REFILLS for any discharge medications will be authorized once you are discharged, as it is imperative that you return to your primary care physician (or establish a relationship with a primary care physician if you do not have one) for your aftercare needs so that they can reassess your need for medications and monitor your lab values.  Discharge Instructions   Call MD for:  redness, tenderness, or signs of infection (pain, swelling, redness, odor or green/yellow discharge around incision site)    Complete by:  As directed      Call MD for:  severe uncontrolled pain    Complete by:  As directed      Call MD for:  temperature >100.4    Complete by:  As directed      Diet - low sodium heart healthy    Complete by:  As directed      Increase activity slowly    Complete by:  As directed             Medication List  albuterol-ipratropium 18-103 MCG/ACT inhaler  Commonly known as:  COMBIVENT  Inhale 2 puffs into the lungs 4 (four) times daily.     ENSURE  Take 237 mLs by mouth 3 (three) times daily between meals.     ferrous sulfate 325 (65 FE) MG tablet  Take 325 mg by mouth 2 (two) times daily with a meal.     fluticasone 50 MCG/ACT nasal spray  Commonly known as:  FLONASE  Place 2 sprays into the nose daily.     pantoprazole 40 MG tablet  Commonly known as:  PROTONIX  Take 1 tablet (40 mg total) by mouth daily at 12 noon.     Vitamin D (Ergocalciferol) 50000 UNITS Caps capsule  Commonly known as:  DRISDOL  Take 50,000 Units by mouth every 7 (seven) days. On mondays       No Known Allergies    The results of significant  diagnostics from this hospitalization (including imaging, microbiology, ancillary and laboratory) are listed below for reference.    Significant Diagnostic Studies: Ct Abdomen Pelvis Wo Contrast  06/04/2014   CLINICAL DATA:  Loss of appetite.  Weakness  EXAM: CT ABDOMEN AND PELVIS WITHOUT CONTRAST  TECHNIQUE: Multidetector CT imaging of the abdomen and pelvis was performed following the standard protocol without IV contrast.  COMPARISON:  CT abdomen pelvis 03/01/2013  FINDINGS: Bibasilar scarring as noted previously.  Unenhanced images of the liver are normal. Calcified granulomata in the liver and spleen. Kidneys reveal no mass or obstruction. Pancreas is negative.  Thickening of the gastric antrum may be due to incomplete distention versus gastritis. No focal mass. No bowel obstruction. No free fluid or adenopathy. Mild sigmoid diverticulosis.  The patient has lost considerable weight since the prior CT.  IMPRESSION: Thickening of the gastric antrum which may be due to gastritis or incomplete distention. Otherwise no acute abnormality  Sigmoid diverticulosis  Pulmonary fibrosis.   Electronically Signed   By: Franchot Gallo M.D.   On: 06/04/2014 21:32   Ct Head Wo Contrast  06/04/2014   CLINICAL DATA:  Head and neck pain. Weakness. Fell several days ago  EXAM: CT HEAD WITHOUT CONTRAST  CT CERVICAL SPINE WITHOUT CONTRAST  TECHNIQUE: Multidetector CT imaging of the head and cervical spine was performed following the standard protocol without intravenous contrast. Multiplanar CT image reconstructions of the cervical spine were also generated.  COMPARISON:  CT head 12/29/2012  FINDINGS: CT HEAD FINDINGS  Generalized atrophy. Chronic microvascular ischemic changes in the white matter are stable.  Negative for acute infarct.  Negative for hemorrhage or mass.  Negative for fracture. Atherosclerotic disease. Prior eye surgery on the right.  CT CERVICAL SPINE FINDINGS  Disc degeneration and spondylosis throughout the  cervical spine most severe at C3-4 and C4-5. Diffuse facet degeneration. There is multilevel spinal stenosis most prominent at C3-4 and C4-5.  Negative for fracture or mass.  IMPRESSION: No acute intracranial abnormality  Cervical spondylosis.  Negative for cervical fracture.   Electronically Signed   By: Franchot Gallo M.D.   On: 06/04/2014 20:07   Ct Cervical Spine Wo Contrast  06/04/2014   CLINICAL DATA:  Head and neck pain. Weakness. Fell several days ago  EXAM: CT HEAD WITHOUT CONTRAST  CT CERVICAL SPINE WITHOUT CONTRAST  TECHNIQUE: Multidetector CT imaging of the head and cervical spine was performed following the standard protocol without intravenous contrast. Multiplanar CT image reconstructions of the cervical spine were also generated.  COMPARISON:  CT head 12/29/2012  FINDINGS: CT  HEAD FINDINGS  Generalized atrophy. Chronic microvascular ischemic changes in the white matter are stable.  Negative for acute infarct.  Negative for hemorrhage or mass.  Negative for fracture. Atherosclerotic disease. Prior eye surgery on the right.  CT CERVICAL SPINE FINDINGS  Disc degeneration and spondylosis throughout the cervical spine most severe at C3-4 and C4-5. Diffuse facet degeneration. There is multilevel spinal stenosis most prominent at C3-4 and C4-5.  Negative for fracture or mass.  IMPRESSION: No acute intracranial abnormality  Cervical spondylosis.  Negative for cervical fracture.   Electronically Signed   By: Franchot Gallo M.D.   On: 06/04/2014 20:07   Dg Chest Portable 1 View  06/04/2014   CLINICAL DATA:  Weakness  EXAM: PORTABLE CHEST - 1 VIEW  COMPARISON:  03/06/2013  FINDINGS: COPD with pulmonary hyperinflation. Chronic scarring in the bases bilaterally is unchanged from prior studies. Negative for superimposed pneumonia or heart failure.  IMPRESSION: Chronic lung disease. No acute abnormality and no change from prior studies.   Electronically Signed   By: Franchot Gallo M.D.   On: 06/04/2014 18:26     Microbiology: No results found for this or any previous visit (from the past 240 hour(s)).   Labs: Basic Metabolic Panel:  Recent Labs Lab 06/06/14 1409 06/06/14 2003 06/07/14 0202 06/07/14 0742 06/09/14 0015  NA 129* 131* 131* 131* 134*  K 4.3 4.3 4.9 4.8 5.2  CL 93* 94* 95* 94* 94*  CO2 31 32 30 31 38*  GLUCOSE 122* 87 84 93 94  BUN 6 5* 5* 5* 7  CREATININE 0.81 0.81 0.76 0.73 0.74  CALCIUM 8.2* 8.3* 8.3* 8.5 8.4   Liver Function Tests:  Recent Labs Lab 06/04/14 1745  AST 59*  ALT 11  ALKPHOS 69  BILITOT 1.0  PROT 7.1  ALBUMIN 3.2*   No results found for this basename: LIPASE, AMYLASE,  in the last 168 hours No results found for this basename: AMMONIA,  in the last 168 hours CBC:  Recent Labs Lab 06/04/14 1745  06/05/14 0405 06/07/14 0202 06/08/14 0500 06/09/14 1048 06/10/14 0430  WBC 4.2  --  4.3 4.5 4.8 3.8* 5.0  NEUTROABS 2.5  --   --   --   --   --   --   HGB 8.4*  < > 7.9* 7.2* 7.1* 6.9* 9.8*  HCT 24.8*  < > 22.3* 20.8* 21.3* 20.5* 28.9*  MCV 70.3*  --  68.4* 70.0* 71.0* 71.4* 74.7*  PLT 132*  --  142* 122* 127* 140* 141*  < > = values in this interval not displayed. Cardiac Enzymes: No results found for this basename: CKTOTAL, CKMB, CKMBINDEX, TROPONINI,  in the last 168 hours BNP: BNP (last 3 results) No results found for this basename: PROBNP,  in the last 8760 hours CBG:  Recent Labs Lab 06/09/14 0753 06/09/14 1120 06/09/14 1633 06/09/14 2231 06/10/14 0720  GLUCAP 85 98 103* 133* 88     Signed:  Shelvie Salsberry  [facility] 06/10/2014, 1:00 PM

## 2014-06-10 NOTE — Progress Notes (Signed)
Report given to Lorenso Courier, RN at [facility].  Waiting for transport, will continue to monitor patient until discharged.

## 2014-06-10 NOTE — Progress Notes (Signed)
Patient is set to discharge to [facility] today. Patient & son, [name] aware. Discharge packet in [place], [facility] aware. [facility] called for transport.   Clinical Social Work Department CLINICAL SOCIAL WORK PLACEMENT NOTE 06/10/2014  Patient:  Anthony [name], Anthony [name]  Account Number:  [id number] Admit date:  06/04/2014  Clinical Social Worker:  Lovey Newcomer  Date/time:  06/08/2014 02:54 PM  Clinical Social Work is seeking post-discharge placement for this patient at the following level of care:   [place]   (*CSW will update this form in Epic as items are completed)   06/08/2014  Patient/family provided with [place] Department of Clinical Social Work's list of facilities offering this level of care within the geographic area requested by the patient (or if unable, by the patient's family).  06/08/2014  Patient/family informed of their freedom to choose among providers that offer the needed level of care, that participate in Medicare, Medicaid or managed care program needed by the patient, have an available bed and are willing to accept the patient.  06/08/2014  Patient/family informed of [facility]' ownership interest in [facility], as well as of the fact that they are under no obligation to receive care at this facility.  PASARR submitted to EDS on 06/08/2014 PASARR number received on 06/08/2014  FL2 transmitted to all facilities in geographic area requested by pt/family on  06/08/2014 FL2 transmitted to all facilities within larger geographic area on   Patient informed that his/her managed care company has contracts with or will negotiate with  certain facilities, including the following:     Patient/family informed of bed offers received:  06/10/2014 Patient chooses bed at [place] Physician recommends and patient chooses bed at    Patient to be transferred to [place] on   06/10/2014 Patient to be transferred to facility by [facility] Patient and family notified of transfer on 06/10/2014 Name of family member notified:  patient's son, [name] via phone  The following physician request were entered in Epic:   Additional Comments:   Raynaldo Opitz, [place] Social Worker cell #: [phone]

## 2014-06-13 LAB — GLUCOSE, CAPILLARY: Glucose-Capillary: 107 mg/dL — ABNORMAL HIGH (ref 70–99)

## 2014-06-24 ENCOUNTER — Emergency Department (HOSPITAL_COMMUNITY): Payer: Medicare Other

## 2014-06-24 ENCOUNTER — Inpatient Hospital Stay (HOSPITAL_COMMUNITY)
Admission: EM | Admit: 2014-06-24 | Discharge: 2014-07-01 | DRG: 190 | Disposition: A | Discharge status: Skilled Nursing Facility | Payer: Medicare Other | Attending: Internal Medicine | Admitting: Internal Medicine

## 2014-06-24 ENCOUNTER — Encounter (HOSPITAL_COMMUNITY): Payer: Self-pay | Admitting: Emergency Medicine

## 2014-06-24 DIAGNOSIS — Z86718 Personal history of other venous thrombosis and embolism: Secondary | ICD-10-CM

## 2014-06-24 DIAGNOSIS — R079 Chest pain, unspecified: Secondary | ICD-10-CM

## 2014-06-24 DIAGNOSIS — Z832 Family history of diseases of the blood and blood-forming organs and certain disorders involving the immune mechanism: Secondary | ICD-10-CM

## 2014-06-24 DIAGNOSIS — R0789 Other chest pain: Secondary | ICD-10-CM

## 2014-06-24 DIAGNOSIS — R109 Unspecified abdominal pain: Secondary | ICD-10-CM | POA: Diagnosis present

## 2014-06-24 DIAGNOSIS — J309 Allergic rhinitis, unspecified: Secondary | ICD-10-CM

## 2014-06-24 DIAGNOSIS — E861 Hypovolemia: Secondary | ICD-10-CM | POA: Diagnosis present

## 2014-06-24 DIAGNOSIS — R0600 Dyspnea, unspecified: Secondary | ICD-10-CM

## 2014-06-24 DIAGNOSIS — K922 Gastrointestinal hemorrhage, unspecified: Secondary | ICD-10-CM | POA: Diagnosis present

## 2014-06-24 DIAGNOSIS — Z86711 Personal history of pulmonary embolism: Secondary | ICD-10-CM

## 2014-06-24 DIAGNOSIS — R195 Other fecal abnormalities: Secondary | ICD-10-CM

## 2014-06-24 DIAGNOSIS — J441 Chronic obstructive pulmonary disease with (acute) exacerbation: Principal | ICD-10-CM | POA: Diagnosis present

## 2014-06-24 DIAGNOSIS — Z9981 Dependence on supplemental oxygen: Secondary | ICD-10-CM

## 2014-06-24 DIAGNOSIS — D72819 Decreased white blood cell count, unspecified: Secondary | ICD-10-CM | POA: Diagnosis present

## 2014-06-24 DIAGNOSIS — J9610 Chronic respiratory failure, unspecified whether with hypoxia or hypercapnia: Secondary | ICD-10-CM | POA: Diagnosis present

## 2014-06-24 DIAGNOSIS — F17200 Nicotine dependence, unspecified, uncomplicated: Secondary | ICD-10-CM | POA: Diagnosis present

## 2014-06-24 DIAGNOSIS — I2789 Other specified pulmonary heart diseases: Secondary | ICD-10-CM

## 2014-06-24 DIAGNOSIS — D509 Iron deficiency anemia, unspecified: Secondary | ICD-10-CM

## 2014-06-24 DIAGNOSIS — R1030 Lower abdominal pain, unspecified: Secondary | ICD-10-CM

## 2014-06-24 DIAGNOSIS — R633 Feeding difficulties, unspecified: Secondary | ICD-10-CM | POA: Diagnosis present

## 2014-06-24 DIAGNOSIS — R1084 Generalized abdominal pain: Secondary | ICD-10-CM

## 2014-06-24 DIAGNOSIS — J438 Other emphysema: Secondary | ICD-10-CM

## 2014-06-24 DIAGNOSIS — Z66 Do not resuscitate: Secondary | ICD-10-CM | POA: Diagnosis present

## 2014-06-24 DIAGNOSIS — D571 Sickle-cell disease without crisis: Secondary | ICD-10-CM | POA: Diagnosis present

## 2014-06-24 DIAGNOSIS — E43 Unspecified severe protein-calorie malnutrition: Secondary | ICD-10-CM | POA: Diagnosis present

## 2014-06-24 DIAGNOSIS — R531 Weakness: Secondary | ICD-10-CM

## 2014-06-24 DIAGNOSIS — R0602 Shortness of breath: Secondary | ICD-10-CM | POA: Diagnosis not present

## 2014-06-24 DIAGNOSIS — D649 Anemia, unspecified: Secondary | ICD-10-CM | POA: Diagnosis present

## 2014-06-24 DIAGNOSIS — E162 Hypoglycemia, unspecified: Secondary | ICD-10-CM

## 2014-06-24 DIAGNOSIS — Z681 Body mass index (BMI) 19 or less, adult: Secondary | ICD-10-CM

## 2014-06-24 DIAGNOSIS — D696 Thrombocytopenia, unspecified: Secondary | ICD-10-CM | POA: Diagnosis present

## 2014-06-24 DIAGNOSIS — K5900 Constipation, unspecified: Secondary | ICD-10-CM | POA: Diagnosis present

## 2014-06-24 DIAGNOSIS — R197 Diarrhea, unspecified: Secondary | ICD-10-CM

## 2014-06-24 DIAGNOSIS — E871 Hypo-osmolality and hyponatremia: Secondary | ICD-10-CM | POA: Diagnosis present

## 2014-06-24 DIAGNOSIS — R627 Adult failure to thrive: Secondary | ICD-10-CM | POA: Diagnosis present

## 2014-06-24 DIAGNOSIS — J449 Chronic obstructive pulmonary disease, unspecified: Secondary | ICD-10-CM

## 2014-06-24 LAB — CBC WITH DIFFERENTIAL/PLATELET
BASOS PCT: 1 % (ref 0–1)
Basophils Absolute: 0.1 10*3/uL (ref 0.0–0.1)
Eosinophils Absolute: 0.2 10*3/uL (ref 0.0–0.7)
Eosinophils Relative: 5 % (ref 0–5)
HCT: 33.5 % — ABNORMAL LOW (ref 39.0–52.0)
HEMOGLOBIN: 10.8 g/dL — AB (ref 13.0–17.0)
LYMPHS ABS: 1.6 10*3/uL (ref 0.7–4.0)
Lymphocytes Relative: 32 % (ref 12–46)
MCH: 24.9 pg — AB (ref 26.0–34.0)
MCHC: 32.2 g/dL (ref 30.0–36.0)
MCV: 77.2 fL — AB (ref 78.0–100.0)
MONO ABS: 0.3 10*3/uL (ref 0.1–1.0)
Monocytes Relative: 7 % (ref 3–12)
Neutro Abs: 2.7 10*3/uL (ref 1.7–7.7)
Neutrophils Relative %: 55 % (ref 43–77)
Platelets: 151 10*3/uL (ref 150–400)
RBC: 4.34 MIL/uL (ref 4.22–5.81)
RDW: 22.1 % — ABNORMAL HIGH (ref 11.5–15.5)
WBC: 4.9 10*3/uL (ref 4.0–10.5)

## 2014-06-24 LAB — URINALYSIS, ROUTINE W REFLEX MICROSCOPIC
Bilirubin Urine: NEGATIVE
Glucose, UA: NEGATIVE mg/dL
Hgb urine dipstick: NEGATIVE
Ketones, ur: NEGATIVE mg/dL
Leukocytes, UA: NEGATIVE
NITRITE: NEGATIVE
Protein, ur: NEGATIVE mg/dL
Specific Gravity, Urine: 1.012 (ref 1.005–1.030)
UROBILINOGEN UA: 1 mg/dL (ref 0.0–1.0)
pH: 7 (ref 5.0–8.0)

## 2014-06-24 LAB — COMPREHENSIVE METABOLIC PANEL
ALT: 10 U/L (ref 0–53)
ANION GAP: 11 (ref 5–15)
AST: 33 U/L (ref 0–37)
Albumin: 3.6 g/dL (ref 3.5–5.2)
Alkaline Phosphatase: 59 U/L (ref 39–117)
BUN: 6 mg/dL (ref 6–23)
CALCIUM: 9.7 mg/dL (ref 8.4–10.5)
CO2: 30 meq/L (ref 19–32)
CREATININE: 0.79 mg/dL (ref 0.50–1.35)
Chloride: 89 mEq/L — ABNORMAL LOW (ref 96–112)
GFR calc Af Amer: >90 mL/min (ref >90)
GFR, EST NON AFRICAN AMERICAN: 85 mL/min — AB (ref >90)
Glucose, Bld: 84 mg/dL (ref 70–99)
Potassium: 4.8 mEq/L (ref 3.7–5.3)
Sodium: 130 mEq/L — ABNORMAL LOW (ref 137–147)
Total Bilirubin: 0.9 mg/dL (ref 0.3–1.2)
Total Protein: 8.2 g/dL (ref 6.0–8.3)

## 2014-06-24 LAB — CBG MONITORING, ED: Glucose-Capillary: 78 mg/dL (ref 70–99)

## 2014-06-24 LAB — PROTIME-INR
INR: 1.04 (ref 0.00–1.49)
Prothrombin Time: 13.6 seconds (ref 11.6–15.2)

## 2014-06-24 LAB — POC OCCULT BLOOD, ED: FECAL OCCULT BLD: POSITIVE — AB

## 2014-06-24 LAB — LIPASE, BLOOD: Lipase: 81 U/L — ABNORMAL HIGH (ref 11–59)

## 2014-06-24 LAB — RETICULOCYTES
RBC.: 4.27 MIL/uL (ref 4.22–5.81)
RETIC CT PCT: 0.4 % (ref 0.4–3.1)
Retic Count, Absolute: 17.1 10*3/uL — ABNORMAL LOW (ref 19.0–186.0)

## 2014-06-24 LAB — I-STAT TROPONIN, ED: TROPONIN I, POC: 0 ng/mL (ref 0.00–0.08)

## 2014-06-24 MED ORDER — IPRATROPIUM-ALBUTEROL 0.5-2.5 (3) MG/3ML IN SOLN
3.0000 mL | Freq: Once | RESPIRATORY_TRACT | Status: AC
  Administered 2014-06-25: 3 mL via RESPIRATORY_TRACT
  Filled 2014-06-24: qty 3

## 2014-06-24 MED ORDER — IOHEXOL 300 MG/ML  SOLN
50.0000 mL | Freq: Once | INTRAMUSCULAR | Status: AC | PRN
  Administered 2014-06-24: 50 mL via INTRAVENOUS

## 2014-06-24 MED ORDER — IOHEXOL 300 MG/ML  SOLN
80.0000 mL | Freq: Once | INTRAMUSCULAR | Status: AC | PRN
  Administered 2014-06-24: 80 mL via INTRAVENOUS

## 2014-06-24 NOTE — ED Notes (Signed)
Per ems pt c/o diarrhea x 3 days; weakness; shortness of breath; copd with home oxygen use

## 2014-06-24 NOTE — ED Notes (Signed)
Patient states he is unable to ambulate with assistance and walker at this time.

## 2014-06-24 NOTE — ED Provider Notes (Signed)
CSN: [id number]     Arrival date & time 06/24/14  1415 History   First MD Initiated Contact with Patient 06/24/14 1502     Chief Complaint  Patient presents with  . Shortness of Breath   Patient is a 78 y.o. male presenting with shortness of breath.  Shortness of Breath   Patient is a 78 y.o. Male with a PMH of sickle cell disease, COPD with current smoking on 2L O2 [place] baseline, PE who presents to the ED with worsening shortness of breath.  Patient states that he began to have worsening shortness of breath approximately 4 days ago.  He began to have some diarrhea with melena on Friday.  Patient states that his diarrhea is associated with periumbilical constant aching, burning pain which does not radiate.  Patient states that he is having 3 stools per day.  He also states that he has lost his appetite and has not been eating or drinking well for the past few days.  Patient states that he was on coumadin for his PE but told me it was stopped after his last hospitalization.  He admits to shortness of breath, weakness, diarrhea, fatigue, chills, and melena.  Patient denies hematochezia, fever, vomiting, urinary symptoms, PND, orthopnea, chest pain.  All other ROS are negative.  Past Medical History  Diagnosis Date  . COPD (chronic obstructive pulmonary disease)   . Sickle cell anemia   . Pulmonary embolism    Past Surgical History  Procedure Laterality Date  . No past surgeries    . Colonoscopy N/A 03/09/2013    Procedure: COLONOSCOPY;  Surgeon: Beryle Beams, MD;  Location: [place];  Service: Endoscopy;  Laterality: N/A;  . Esophagogastroduodenoscopy N/A 03/09/2013    Procedure: ESOPHAGOGASTRODUODENOSCOPY (EGD);  Surgeon: Beryle Beams, MD;  Location: [facility] ENDOSCOPY;  Service: Endoscopy;  Laterality: N/A;   Family History  Problem Relation Age of Onset  . Sickle cell anemia Mother    History  Substance Use Topics  . Smoking status: Light Tobacco Smoker  . Smokeless tobacco: Not on file   . Alcohol Use: No    Review of Systems  Respiratory: Positive for shortness of breath.     See HPI  Allergies  Review of patient's allergies indicates no known allergies.  Home Medications   Prior to Admission medications   Medication Sig Start Date End Date Taking? Authorizing Provider  albuterol-ipratropium (COMBIVENT) 18-103 MCG/ACT inhaler Inhale 2 puffs into the lungs 4 (four) times daily.   Yes Historical Provider, MD  ENSURE (ENSURE) Take 237 mLs by mouth 3 (three) times daily between meals.   Yes Historical Provider, MD  ferrous sulfate 325 (65 FE) MG tablet Take 325 mg by mouth 2 (two) times daily with a meal. 03/09/13  Yes Sorin C Laza, MD  fluticasone (FLONASE) 50 MCG/ACT nasal spray Place 2 sprays into the nose daily. 12/30/12  Yes Thurnell Lose, MD  pantoprazole (PROTONIX) 40 MG tablet Take 1 tablet (40 mg total) by mouth daily at 12 noon. 03/09/13  Yes Sorin June Leap, MD  Vitamin D, Ergocalciferol, (DRISDOL) 50000 UNITS CAPS Take 50,000 Units by mouth every 7 (seven) days. On mondays   Yes Historical Provider, MD   BP 114/73  Pulse 101  Temp(Src) 97.7 F (36.5 C) (Oral)  Resp 20  SpO2 100% Physical Exam  Nursing note and vitals reviewed. Constitutional: He is oriented to person, place, and time. He appears cachectic. No distress. Nasal cannula in place.  Chronically ill  appearing, on [place] oxygen.    HENT:  Head: Normocephalic and atraumatic.  Mouth/Throat: Oropharynx is clear and moist. No oropharyngeal exudate.  Eyes: Conjunctivae and EOM are normal. Pupils are equal, round, and reactive to light. No scleral icterus.  Neck: Normal range of motion. Neck supple. No JVD present. No thyromegaly present.  Cardiovascular: Normal rate, regular rhythm, normal heart sounds and intact distal pulses.  Exam reveals no gallop and no friction rub.   No murmur heard. Pulmonary/Chest: No respiratory distress. He has no rales. He exhibits no tenderness.  Mildly increased effort.   Distant breath sounds with mild end expiratory wheeze heard best at the lung bases  Abdominal: Soft. Normal appearance and bowel sounds are normal. He exhibits no distension and no mass. There is generalized tenderness. There is no rigidity, no rebound, no guarding, no CVA tenderness, no tenderness at McBurney's point and negative Murphy's sign.  Musculoskeletal: Normal range of motion.  Lymphadenopathy:    He has no cervical adenopathy.  Neurological: He is alert and oriented to person, place, and time. No cranial nerve deficit. Coordination normal.  Skin: Skin is warm and dry. He is not diaphoretic.  Psychiatric: He has a normal mood and affect. His behavior is normal. Judgment and thought content normal.    ED Course  Procedures (including critical care time) Labs Review Labs Reviewed  CBC WITH DIFFERENTIAL - Abnormal; Notable for the following:    Hemoglobin 10.8 (*)    HCT 33.5 (*)    MCV 77.2 (*)    MCH 24.9 (*)    RDW 22.1 (*)    All other components within normal limits  COMPREHENSIVE METABOLIC PANEL - Abnormal; Notable for the following:    Sodium 130 (*)    Chloride 89 (*)    GFR calc non Af Amer 85 (*)    All other components within normal limits  URINALYSIS, ROUTINE W REFLEX MICROSCOPIC - Abnormal; Notable for the following:    APPearance CLOUDY (*)    All other components within normal limits  LIPASE, BLOOD - Abnormal; Notable for the following:    Lipase 81 (*)    All other components within normal limits  RETICULOCYTES - Abnormal; Notable for the following:    Retic Count, Manual 17.1 (*)    All other components within normal limits  POC OCCULT BLOOD, ED - Abnormal; Notable for the following:    Fecal Occult Bld POSITIVE (*)    All other components within normal limits  PROTIME-INR  I-STAT TROPOININ, ED  CBG MONITORING, ED    Imaging Review Dg Chest 2 View  06/24/2014   CLINICAL DATA:  Short of breath, cough  EXAM: CHEST  2 VIEW  COMPARISON:  None.   FINDINGS: Normal cardiac silhouette. Lungs are hyperinflated. There is a chronic scarring at the right lung base. No pulmonary edema. No infiltrate. No pneumothorax.  IMPRESSION: 1. Severe left hyperinflated lungs.  2. Chronic scarring at the right lung base.  3. No acute findings.   Electronically Signed   By: Suzy Bouchard M.D.   On: 06/24/2014 16:55   Ct Abdomen Pelvis W Contrast  06/24/2014   CLINICAL DATA:  Diarrhea for the past 3 days. Shortness of breath. COPD. History of sickle cell anemia.  EXAM: CT ABDOMEN AND PELVIS WITH CONTRAST  TECHNIQUE: Multidetector CT imaging of the abdomen and pelvis was performed using the standard protocol following bolus administration of intravenous contrast.  CONTRAST:  75mL OMNIPAQUE IOHEXOL 300 MG/ML  SOLN  COMPARISON:  Radiographs obtained earlier today. Previous CT examinations, the most recent dated 03/01/2013.  FINDINGS: Again demonstrated is diffusely heterogeneous enhancement of the kidneys with multiple peripheral and central areas of low density. No discrete mass is seen. There is initial mild diffuse heterogeneity of the liver, improved on the delayed images through the kidneys. Peripheral splenic calcifications are unchanged. Small calcified granulomata in the liver are again demonstrated.  Dense atheromatous arterial calcifications. Unremarkable bowel gas pattern. There are multiple mildly prominent loops of jejunum without abnormal dilatation. The pancreas, gallbladder adrenal glands and urinary bladder are unremarkable. No prostatic enlargement. Multiple areas of patchy bone sclerosis are again demonstrated. Mild bilateral lower lobe atelectasis. These involve the proximal femurs, pelvis and lumbar spine.  IMPRESSION: 1. No acute abnormality. 2. Stable heterogeneity of the kidneys. 3. Stable areas of bone sclerosis with an appearance most compatible with changes associated with the patient's sickle cell disease. Metastatic prostate cancer is less likely.    Electronically Signed   By: Enrique Sack M.D.   On: 06/24/2014 20:46   Dg Abd 2 Views  06/24/2014   CLINICAL DATA:  Abdominal pain, diarrhea, shortness of breath, history COPD, sickle cell anemia  EXAM: ABDOMEN - 2 VIEW  COMPARISON:  12/29/2012  FINDINGS: Gas throughout nondistended small bowel loops and upper normal caliber colon.  No evidence of bowel obstruction or wall thickening.  No free intraperitoneal air.  Bones appear demineralized with noted bone infarcts in the proximal femora bilaterally.  Lung bases appear emphysematous but clear.  IMPRESSION: Nonobstructive bowel gas pattern.   Electronically Signed   By: Lavonia Dana M.D.   On: 06/24/2014 16:54     EKG Interpretation None      MDM   Final diagnoses:  COPD exacerbation  Weakness  Diarrhea  Occult GI bleeding   Patient is a 78 y.o. Chronically ill appearing male who presents to the ED with weakness, shortness of breath, and diarrhea.  Patient appears to be cachectic and chronically ill on physical exam, there is abdominal tenderness with no obvious signs of peritonitis or rigidity.  Patient is hemoccult positive on physical exam.  CBC shows stable anemia which is likely falsely elevated secondary to hemoconcentration.  CMP shows hyponatremia and hypochoremia.  Lipase is negative.  Reticulocyte count is unremarkable.  Istat troponin and PT/INR are unremarkable.  Patient had an abdominal xray which was unremarkable.  Given GI bleeding and history of diverticulosis I was compelled to get a CT abdomen and pelvis which was also unremarkable for acute processes.  CXR is negative at this time.  Patient was unable to be walked here due to shortness of breath despite breathing treatments.  Given shortness of breath and inability to take care of himself I have spoken with Dr. Cruzita Lederer about admission at this time.  Patient will be admitted to [facility] for COPD exacerbation.  Patient was seen by and discussed with Dr. Colin Rhein who agrees with the  above workup.      Cherylann Parr, PA-C 06/25/14 0022

## 2014-06-24 NOTE — ED Notes (Signed)
CBG 78, Anthony [name], [place] made aware.

## 2014-06-24 NOTE — ED Notes (Signed)
Asked pt if he would be able to provide urine specimen. Pt requested if we could wait a little he would try then.

## 2014-06-24 NOTE — ED Notes (Signed)
A urinal was given to patient. He will try when he can.

## 2014-06-24 NOTE — ED Notes (Signed)
Bed: WA03 Expected date:  Expected time:  Means of arrival:  Comments: EMS 

## 2014-06-25 ENCOUNTER — Encounter (HOSPITAL_COMMUNITY): Payer: Self-pay | Admitting: Radiology

## 2014-06-25 ENCOUNTER — Inpatient Hospital Stay (HOSPITAL_COMMUNITY): Payer: Medicare Other

## 2014-06-25 DIAGNOSIS — J441 Chronic obstructive pulmonary disease with (acute) exacerbation: Secondary | ICD-10-CM | POA: Diagnosis present

## 2014-06-25 DIAGNOSIS — Z832 Family history of diseases of the blood and blood-forming organs and certain disorders involving the immune mechanism: Secondary | ICD-10-CM | POA: Diagnosis not present

## 2014-06-25 DIAGNOSIS — K5900 Constipation, unspecified: Secondary | ICD-10-CM | POA: Diagnosis present

## 2014-06-25 DIAGNOSIS — J9610 Chronic respiratory failure, unspecified whether with hypoxia or hypercapnia: Secondary | ICD-10-CM | POA: Diagnosis present

## 2014-06-25 DIAGNOSIS — F17200 Nicotine dependence, unspecified, uncomplicated: Secondary | ICD-10-CM | POA: Diagnosis present

## 2014-06-25 DIAGNOSIS — E871 Hypo-osmolality and hyponatremia: Secondary | ICD-10-CM | POA: Diagnosis present

## 2014-06-25 DIAGNOSIS — R0989 Other specified symptoms and signs involving the circulatory and respiratory systems: Secondary | ICD-10-CM

## 2014-06-25 DIAGNOSIS — R633 Feeding difficulties, unspecified: Secondary | ICD-10-CM | POA: Diagnosis present

## 2014-06-25 DIAGNOSIS — R0602 Shortness of breath: Secondary | ICD-10-CM | POA: Diagnosis present

## 2014-06-25 DIAGNOSIS — R0609 Other forms of dyspnea: Secondary | ICD-10-CM

## 2014-06-25 DIAGNOSIS — Z681 Body mass index (BMI) 19 or less, adult: Secondary | ICD-10-CM | POA: Diagnosis not present

## 2014-06-25 DIAGNOSIS — K922 Gastrointestinal hemorrhage, unspecified: Secondary | ICD-10-CM | POA: Diagnosis present

## 2014-06-25 DIAGNOSIS — D571 Sickle-cell disease without crisis: Secondary | ICD-10-CM | POA: Diagnosis present

## 2014-06-25 DIAGNOSIS — Z9981 Dependence on supplemental oxygen: Secondary | ICD-10-CM | POA: Diagnosis not present

## 2014-06-25 DIAGNOSIS — D72819 Decreased white blood cell count, unspecified: Secondary | ICD-10-CM | POA: Diagnosis present

## 2014-06-25 DIAGNOSIS — Z86711 Personal history of pulmonary embolism: Secondary | ICD-10-CM | POA: Diagnosis not present

## 2014-06-25 DIAGNOSIS — E861 Hypovolemia: Secondary | ICD-10-CM | POA: Diagnosis present

## 2014-06-25 DIAGNOSIS — E162 Hypoglycemia, unspecified: Secondary | ICD-10-CM

## 2014-06-25 DIAGNOSIS — R627 Adult failure to thrive: Secondary | ICD-10-CM | POA: Diagnosis present

## 2014-06-25 DIAGNOSIS — J449 Chronic obstructive pulmonary disease, unspecified: Secondary | ICD-10-CM

## 2014-06-25 DIAGNOSIS — D696 Thrombocytopenia, unspecified: Secondary | ICD-10-CM | POA: Diagnosis present

## 2014-06-25 DIAGNOSIS — Z66 Do not resuscitate: Secondary | ICD-10-CM | POA: Diagnosis present

## 2014-06-25 DIAGNOSIS — R0600 Dyspnea, unspecified: Secondary | ICD-10-CM | POA: Diagnosis present

## 2014-06-25 DIAGNOSIS — R197 Diarrhea, unspecified: Secondary | ICD-10-CM | POA: Diagnosis present

## 2014-06-25 DIAGNOSIS — E43 Unspecified severe protein-calorie malnutrition: Secondary | ICD-10-CM

## 2014-06-25 DIAGNOSIS — R079 Chest pain, unspecified: Secondary | ICD-10-CM

## 2014-06-25 DIAGNOSIS — D509 Iron deficiency anemia, unspecified: Secondary | ICD-10-CM | POA: Diagnosis present

## 2014-06-25 DIAGNOSIS — I379 Nonrheumatic pulmonary valve disorder, unspecified: Secondary | ICD-10-CM

## 2014-06-25 LAB — BASIC METABOLIC PANEL
Anion gap: 11 (ref 5–15)
BUN: 7 mg/dL (ref 6–23)
CHLORIDE: 92 meq/L — AB (ref 96–112)
CO2: 29 meq/L (ref 19–32)
CREATININE: 0.74 mg/dL (ref 0.50–1.35)
Calcium: 9.1 mg/dL (ref 8.4–10.5)
GFR calc Af Amer: >90 mL/min (ref >90)
GFR calc non Af Amer: 87 mL/min — ABNORMAL LOW (ref >90)
GLUCOSE: 83 mg/dL (ref 70–99)
POTASSIUM: 4.4 meq/L (ref 3.7–5.3)
Sodium: 132 mEq/L — ABNORMAL LOW (ref 137–147)

## 2014-06-25 LAB — CBC
HCT: 32.4 % — ABNORMAL LOW (ref 39.0–52.0)
HEMOGLOBIN: 10.5 g/dL — AB (ref 13.0–17.0)
MCH: 25.2 pg — ABNORMAL LOW (ref 26.0–34.0)
MCHC: 32.4 g/dL (ref 30.0–36.0)
MCV: 77.7 fL — ABNORMAL LOW (ref 78.0–100.0)
Platelets: 140 10*3/uL — ABNORMAL LOW (ref 150–400)
RBC: 4.17 MIL/uL — AB (ref 4.22–5.81)
RDW: 22.3 % — ABNORMAL HIGH (ref 11.5–15.5)
WBC: 6.2 10*3/uL (ref 4.0–10.5)

## 2014-06-25 LAB — TROPONIN I
Troponin I: 0.33 ng/mL (ref <0.30)
Troponin I: 0.35 ng/mL (ref <0.30)
Troponin I: <0.3 ng/mL (ref <0.30)
Troponin I: <0.3 ng/mL (ref <0.30)

## 2014-06-25 LAB — D-DIMER, QUANTITATIVE: D-Dimer, Quant: 1.6 ug/mL-FEU — ABNORMAL HIGH (ref 0.00–0.48)

## 2014-06-25 LAB — MRSA PCR SCREENING: MRSA by PCR: NEGATIVE

## 2014-06-25 MED ORDER — ENSURE COMPLETE PO LIQD
237.0000 mL | Freq: Three times a day (TID) | ORAL | Status: DC
  Administered 2014-06-25 – 2014-06-30 (×12): 237 mL via ORAL

## 2014-06-25 MED ORDER — ALBUTEROL SULFATE (2.5 MG/3ML) 0.083% IN NEBU: 2.5000 mg | INHALATION_SOLUTION | RESPIRATORY_TRACT | Status: AC | PRN

## 2014-06-25 MED ORDER — MORPHINE SULFATE 2 MG/ML IJ SOLN
2.0000 mg | Freq: Once | INTRAMUSCULAR | Status: AC
  Administered 2014-06-25: 2 mg via INTRAVENOUS
  Filled 2014-06-25: qty 1

## 2014-06-25 MED ORDER — IOHEXOL 350 MG/ML SOLN
80.0000 mL | Freq: Once | INTRAVENOUS | Status: AC | PRN
  Administered 2014-06-25: 80 mL via INTRAVENOUS

## 2014-06-25 MED ORDER — FLUTICASONE PROPIONATE 50 MCG/ACT NA SUSP
2.0000 | Freq: Every day | NASAL | Status: DC
  Administered 2014-06-25 – 2014-07-01 (×7): 2 via NASAL
  Filled 2014-06-25 (×2): qty 16

## 2014-06-25 MED ORDER — IPRATROPIUM-ALBUTEROL 0.5-2.5 (3) MG/3ML IN SOLN
3.0000 mL | Freq: Four times a day (QID) | RESPIRATORY_TRACT | Status: DC
  Administered 2014-06-25 – 2014-07-01 (×26): 3 mL via RESPIRATORY_TRACT
  Filled 2014-06-25 (×27): qty 3

## 2014-06-25 MED ORDER — NITROGLYCERIN 0.4 MG SL SUBL
0.4000 mg | SUBLINGUAL_TABLET | SUBLINGUAL | Status: DC | PRN
  Administered 2014-06-25 (×2): 0.4 mg via SUBLINGUAL
  Filled 2014-06-25: qty 1

## 2014-06-25 MED ORDER — IPRATROPIUM-ALBUTEROL 0.5-2.5 (3) MG/3ML IN SOLN
3.0000 mL | Freq: Four times a day (QID) | RESPIRATORY_TRACT | Status: DC | PRN
  Administered 2014-06-25 – 2014-06-29 (×4): 3 mL via RESPIRATORY_TRACT
  Filled 2014-06-25 (×5): qty 3

## 2014-06-25 MED ORDER — IPRATROPIUM-ALBUTEROL 18-103 MCG/ACT IN AERO: 2.0000 | INHALATION_SPRAY | Freq: Four times a day (QID) | RESPIRATORY_TRACT | Status: DC

## 2014-06-25 MED ORDER — SODIUM CHLORIDE 0.9 % IV SOLN
INTRAVENOUS | Status: DC
  Administered 2014-06-25: 06:00:00 via INTRAVENOUS

## 2014-06-25 MED ORDER — ASPIRIN 81 MG PO CHEW
324.0000 mg | CHEWABLE_TABLET | Freq: Once | ORAL | Status: AC
  Administered 2014-06-25: 324 mg via ORAL
  Filled 2014-06-25: qty 4

## 2014-06-25 MED ORDER — FERROUS SULFATE 325 (65 FE) MG PO TABS
325.0000 mg | ORAL_TABLET | Freq: Two times a day (BID) | ORAL | Status: DC
  Administered 2014-06-25 – 2014-07-01 (×12): 325 mg via ORAL
  Filled 2014-06-25 (×15): qty 1

## 2014-06-25 MED ORDER — MORPHINE SULFATE 2 MG/ML IJ SOLN
2.0000 mg | INTRAMUSCULAR | Status: DC | PRN
  Administered 2014-06-25 – 2014-06-30 (×8): 2 mg via INTRAVENOUS
  Filled 2014-06-25 (×8): qty 1

## 2014-06-25 MED ORDER — CETYLPYRIDINIUM CHLORIDE 0.05 % MT LIQD
7.0000 mL | Freq: Two times a day (BID) | OROMUCOSAL | Status: DC
  Administered 2014-06-25 – 2014-07-01 (×12): 7 mL via OROMUCOSAL

## 2014-06-25 MED ORDER — NITROGLYCERIN 0.4 MG/HR TD PT24
0.4000 mg | MEDICATED_PATCH | Freq: Every day | TRANSDERMAL | Status: DC
  Administered 2014-06-25 – 2014-06-27 (×3): 0.4 mg via TRANSDERMAL
  Filled 2014-06-25 (×3): qty 1

## 2014-06-25 MED ORDER — PANTOPRAZOLE SODIUM 40 MG IV SOLR
40.0000 mg | Freq: Two times a day (BID) | INTRAVENOUS | Status: DC
  Administered 2014-06-25 – 2014-06-27 (×5): 40 mg via INTRAVENOUS
  Filled 2014-06-25 (×6): qty 40

## 2014-06-25 MED ORDER — SODIUM CHLORIDE 0.9 % IJ SOLN
3.0000 mL | Freq: Two times a day (BID) | INTRAMUSCULAR | Status: DC
  Administered 2014-06-25 – 2014-07-01 (×12): 3 mL via INTRAVENOUS

## 2014-06-25 MED ORDER — SODIUM CHLORIDE 0.9 % IV SOLN
INTRAVENOUS | Status: DC
  Administered 2014-06-25 – 2014-06-26 (×2): via INTRAVENOUS

## 2014-06-25 NOTE — Progress Notes (Signed)
INITIAL NUTRITION ASSESSMENT  Pt meets criteria for severe MALNUTRITION in the context of chronic illness as evidenced by 12% weight loss in the past month with severe muscle wasting and subcutaneous fat loss throughout body.  DOCUMENTATION CODES Per approved criteria  -Severe malnutrition in the context of chronic illness -Underweight   INTERVENTION: - Diet advancement per MD - Recommend Ensure Complete TID when diet advanced - Provided "High calorie high protein diet therapy" handout from the Academy of [employer]etics which was reviewed with pt. RD contact information provided.  - RD to continue to monitor   NUTRITION DIAGNOSIS: Inadequate oral intake related to inability to eat as evidenced by NPO.   Goal: Advance diet as tolerated to regular diet   Monitor:  Weights, labs, intake  Reason for Assessment: Malnutrition screening tool, consult for assessment   78 y.o. male  Admitting Dx: Dyspnea  ASSESSMENT: Pt with PMH of sickle cell disease, COPD with current smoking on 2L O2 [place] baseline, PE who presents to the ED with worsening shortness of breath. Patient states that he began to have worsening shortness of breath approximately 4 days ago. He began to have some diarrhea with melena on Friday. Patient states that his diarrhea is associated with periumbilical constant aching, burning pain which does not radiate. Patient states that he is having 3 stools per day. He also states that he has lost his appetite and has not been eating or drinking well for the past few days.   - Pt known to RD from past admissions - Pt resting in bed during RD visit, visibly cachetic - Said he eats pancakes/cornflakes with orange juice for breakfast, eggs and sausage with sweet tea for lunch, and forgets what he usually eats for dinner - Reports drinking 2 Ensure/day at home - Knows he's been dropping weight, down 13 pounds in the past month - Pt falling asleep at end of visit     Height: Ht Readings from Last 1 Encounters:  06/25/14 5\' 10"  (1.778 m)    Weight: Wt Readings from Last 1 Encounters:  06/25/14 93 lb 11.1 oz (42.5 kg)    Ideal Body Weight: 166 lbs   % Ideal Body Weight: 56%  Wt Readings from Last 10 Encounters:  06/25/14 93 lb 11.1 oz (42.5 kg)  06/10/14 106 lb 0.7 oz (48.1 kg)  03/09/13 115 lb 15.4 oz (52.6 kg)  03/09/13 115 lb 15.4 oz (52.6 kg)  12/29/12 101 lb 13.6 oz (46.2 kg)  04/10/09 136 lb 8 oz (61.916 kg)    Usual Body Weight: 106 lbs earlier this month  % Usual Body Weight: 88%  BMI:  Body mass index is 13.44 kg/(m^2). Underweight   Estimated Nutritional Needs: Kcal: 1700-1900 Protein: 85-105g Fluid: 1.7-1.9L/day   Skin: intact  Diet Order: NPO  EDUCATION NEEDS: -Education needs addressed - discussed ways to gain weight   Intake/Output Summary (Last 24 hours) at 06/25/14 1301 Last data filed at 06/25/14 1200  Gross per 24 hour  Intake 363.33 ml  Output    250 ml  Net 113.33 ml    Last BM: 8/23  Labs:   Recent Labs Lab 06/24/14 1445 06/25/14 0509  NA 130* 132*  K 4.8 4.4  CL 89* 92*  CO2 30 29  BUN 6 7  CREATININE 0.79 0.74  CALCIUM 9.7 9.1  GLUCOSE 84 83    CBG (last 3)   Recent Labs  06/24/14 1539  GLUCAP 78    Scheduled Meds: . antiseptic  oral rinse  7 mL Mouth Rinse BID  . feeding supplement (ENSURE COMPLETE)  237 mL Oral TID BM  . ferrous sulfate  325 mg Oral BID WC  . fluticasone  2 spray Each Nare Daily  . ipratropium-albuterol  3 mL Nebulization QID  . nitroGLYCERIN  0.4 mg Transdermal Daily  . pantoprazole (PROTONIX) IV  40 mg Intravenous Q12H  . sodium chloride  3 mL Intravenous Q12H    Continuous Infusions: . sodium chloride 50 mL/hr at 06/25/14 0544  . sodium chloride 50 mL/hr at 06/25/14 0249    Past Medical History  Diagnosis Date  . COPD (chronic obstructive pulmonary disease)   . Sickle cell anemia   . Pulmonary embolism     Past Surgical History   Procedure Laterality Date  . No past surgeries    . Colonoscopy N/A 03/09/2013    Procedure: COLONOSCOPY;  Surgeon: Beryle Beams, MD;  Location: [place];  Service: Endoscopy;  Laterality: N/A;  . Esophagogastroduodenoscopy N/A 03/09/2013    Procedure: ESOPHAGOGASTRODUODENOSCOPY (EGD);  Surgeon: Beryle Beams, MD;  Location: [facility] ENDOSCOPY;  Service: Endoscopy;  Laterality: N/A;    Carlis Stable MS, [place], LDN [phone] Pager [phone] Weekend/After Hours Pager

## 2014-06-25 NOTE — ED Notes (Signed)
Main lab called and made aware D-dimer has been unsuccessful at obtaining.

## 2014-06-25 NOTE — ED Provider Notes (Signed)
Medical screening examination/treatment/procedure(s) were conducted as a shared visit with non-physician practitioner(s) and myself.  I personally evaluated the patient during the encounter.   EKG Interpretation None       I performed an examination on the patient including cardiac, pulmonary, and gi systems which were unremarkable except as documented by PA.  He is unable to care for himself or ambulate at this time due to his dyspnea, so admitted to hospitalist.    Debby Freiberg, MD 06/25/14 [phone]

## 2014-06-25 NOTE — ED Notes (Signed)
Attempted to call report. Unable to take report at this time. Will call back.

## 2014-06-25 NOTE — Consult Note (Signed)
CARDIOLOGY CONSULT NOTE       Patient ID: AADON GORELIK MRN: [id number] DOB/AGE: 1935/11/24 78 y.o.  Admit date: 06/24/2014 Referring Physician:  Hartford Poli Primary Physician: Philis Fendt, MD Primary Cardiologist:   Reason for Consultation:  Chest pain and dyspnea   Principal Problem:   Dyspnea Active Problems:   COPD   PULMONARY EMBOLISM, HX OF   Anemia   Abdominal pain   Hyponatremia   Protein-calorie malnutrition, severe   Diarrhea   Chest pain   HPI:   Elderly frail male admitted with dyspnea and chest pain.  Has COPD, still smoking  History of PE previously on anticoagulation but ? Stopped due to anemia. And GI bleeding  Recently d/c from hospital to [facility] and then home  Has had weakness, diarhea and dyspnea  No real chest pain to my interview just cough sputum and more pleuritic pain.  Seems to have FTT.  No history of CAD.  Has had pancreatitis and elevated lipase in past.  Currently resting comfortably post CT scan with improved breathing and no pain  Denies excess ETOH  Also has history of anemia and sickle cell disease   ROS All other systems reviewed and negative except as noted above  Past Medical History  Diagnosis Date  . COPD (chronic obstructive pulmonary disease)   . Sickle cell anemia   . Pulmonary embolism     Family History  Problem Relation Age of Onset  . Sickle cell anemia Mother     History   Social History  . Marital Status: Divorced    Spouse Name: N/A    Number of Children: N/A  . Years of Education: N/A   Occupational History  . Not on file.   Social History Main Topics  . Smoking status: Light Tobacco Smoker  . Smokeless tobacco: Not on file  . Alcohol Use: No  . Drug Use: Not on file  . Sexual Activity: Not on file   Other Topics Concern  . Not on file   Social History Narrative  . No narrative on file    Past Surgical History  Procedure Laterality Date  . No past surgeries    . Colonoscopy N/A 03/09/2013   Procedure: COLONOSCOPY;  Surgeon: Beryle Beams, MD;  Location: [facility];  Service: Endoscopy;  Laterality: N/A;  . Esophagogastroduodenoscopy N/A 03/09/2013    Procedure: ESOPHAGOGASTRODUODENOSCOPY (EGD);  Surgeon: Beryle Beams, MD;  Location: [facility] ENDOSCOPY;  Service: Endoscopy;  Laterality: N/A;     . antiseptic oral rinse  7 mL Mouth Rinse BID  . feeding supplement (ENSURE COMPLETE)  237 mL Oral TID BM  . ferrous sulfate  325 mg Oral BID WC  . fluticasone  2 spray Each Nare Daily  . ipratropium-albuterol  3 mL Nebulization QID  . nitroGLYCERIN  0.4 mg Transdermal Daily  . pantoprazole (PROTONIX) IV  40 mg Intravenous Q12H  . sodium chloride  3 mL Intravenous Q12H   . sodium chloride 50 mL/hr at 06/25/14 0544  . sodium chloride 50 mL/hr at 06/25/14 0249    Physical Exam: Blood pressure 141/71, pulse 85, temperature 97.3 F (36.3 C), temperature source Oral, resp. rate 20, height 5\' 10"  (1.778 m), weight 93 lb 11.1 oz (42.5 kg), SpO2 98.00%.   Affect appropriate Frail thin elderly black male  HEENT: normal Neck supple with no adenopathy JVP normal no bruits no thyromegaly Lungs Emphysema poor air movement no active wheezing and good diaphragmatic motion Heart:  S1/S2 no murmur, no  rub, gallop or click PMI normal Abdomen: benighn, BS positve, no tenderness, no AAA no bruit.  No HSM or HJR Distal pulses intact with no bruits No edema Neuro non-focal Skin warm and dry No muscular weakness   Labs:   Lab Results  Component Value Date   WBC 6.2 06/25/2014   HGB 10.5* 06/25/2014   HCT 32.4* 06/25/2014   MCV 77.7* 06/25/2014   PLT 140* 06/25/2014    Recent Labs Lab 06/24/14 1445 06/25/14 0509  NA 130* 132*  K 4.8 4.4  CL 89* 92*  CO2 30 29  BUN 6 7  CREATININE 0.79 0.74  CALCIUM 9.7 9.1  PROT 8.2  --   BILITOT 0.9  --   ALKPHOS 59  --   ALT 10  --   AST 33  --   GLUCOSE 84 83   Lab Results  Component Value Date   CKTOTAL 130 12/26/2009   CKMB 3.6 12/26/2009    TROPONINI <0.30 06/25/2014    Lab Results  Component Value Date   CHOL 241* 03/01/2013   CHOL  Value: 179        ATP III CLASSIFICATION:  <200     mg/dL   Desirable  200-239  mg/dL   Borderline High  >=240    mg/dL   High        02/16/2009   Lab Results  Component Value Date   HDL 199 03/01/2013   HDL 105 02/16/2009   Lab Results  Component Value Date   LDLCALC 35 03/01/2013   LDLCALC  Value: 67        Total Cholesterol/HDL:CHD Risk Coronary Heart Disease Risk Table                     Men   Women  1/2 Average Risk   3.4   3.3  Average Risk       5.0   4.4  2 X Average Risk   9.6   7.1  3 X Average Risk  23.4   11.0        Use the calculated Patient Ratio above and the CHD Risk Table to determine the patient's CHD Risk.        ATP III CLASSIFICATION (LDL):  <100     mg/dL   Optimal  100-129  mg/dL   Near or Above                    Optimal  130-159  mg/dL   Borderline  160-189  mg/dL   High  >190     mg/dL   Very High 02/16/2009   Lab Results  Component Value Date   TRIG 33 03/01/2013   TRIG 34 02/16/2009   Lab Results  Component Value Date   CHOLHDL 1.2 03/01/2013   CHOLHDL 1.7 02/16/2009   No results found for this basename: LDLDIRECT      Radiology: Ct Abdomen Pelvis Wo Contrast  06/04/2014   CLINICAL DATA:  Loss of appetite.  Weakness  EXAM: CT ABDOMEN AND PELVIS WITHOUT CONTRAST  TECHNIQUE: Multidetector CT imaging of the abdomen and pelvis was performed following the standard protocol without IV contrast.  COMPARISON:  CT abdomen pelvis 03/01/2013  FINDINGS: Bibasilar scarring as noted previously.  Unenhanced images of the liver are normal. Calcified granulomata in the liver and spleen. Kidneys reveal no mass or obstruction. Pancreas is negative.  Thickening of the gastric antrum may be due to incomplete distention  versus gastritis. No focal mass. No bowel obstruction. No free fluid or adenopathy. Mild sigmoid diverticulosis.  The patient has lost considerable weight since the prior CT.   IMPRESSION: Thickening of the gastric antrum which may be due to gastritis or incomplete distention. Otherwise no acute abnormality  Sigmoid diverticulosis  Pulmonary fibrosis.   Electronically Signed   By: Franchot Gallo M.D.   On: 06/04/2014 21:32   Dg Chest 2 View  06/24/2014   CLINICAL DATA:  Short of breath, cough  EXAM: CHEST  2 VIEW  COMPARISON:  None.  FINDINGS: Normal cardiac silhouette. Lungs are hyperinflated. There is a chronic scarring at the right lung base. No pulmonary edema. No infiltrate. No pneumothorax.  IMPRESSION: 1. Severe left hyperinflated lungs.  2. Chronic scarring at the right lung base.  3. No acute findings.   Electronically Signed   By: Suzy Bouchard M.D.   On: 06/24/2014 16:55   Ct Head Wo Contrast  06/04/2014   CLINICAL DATA:  Head and neck pain. Weakness. Fell several days ago  EXAM: CT HEAD WITHOUT CONTRAST  CT CERVICAL SPINE WITHOUT CONTRAST  TECHNIQUE: Multidetector CT imaging of the head and cervical spine was performed following the standard protocol without intravenous contrast. Multiplanar CT image reconstructions of the cervical spine were also generated.  COMPARISON:  CT head 12/29/2012  FINDINGS: CT HEAD FINDINGS  Generalized atrophy. Chronic microvascular ischemic changes in the white matter are stable.  Negative for acute infarct.  Negative for hemorrhage or mass.  Negative for fracture. Atherosclerotic disease. Prior eye surgery on the right.  CT CERVICAL SPINE FINDINGS  Disc degeneration and spondylosis throughout the cervical spine most severe at C3-4 and C4-5. Diffuse facet degeneration. There is multilevel spinal stenosis most prominent at C3-4 and C4-5.  Negative for fracture or mass.  IMPRESSION: No acute intracranial abnormality  Cervical spondylosis.  Negative for cervical fracture.   Electronically Signed   By: Franchot Gallo M.D.   On: 06/04/2014 20:07   Ct Cervical Spine Wo Contrast  06/04/2014   CLINICAL DATA:  Head and neck pain. Weakness. Fell  several days ago  EXAM: CT HEAD WITHOUT CONTRAST  CT CERVICAL SPINE WITHOUT CONTRAST  TECHNIQUE: Multidetector CT imaging of the head and cervical spine was performed following the standard protocol without intravenous contrast. Multiplanar CT image reconstructions of the cervical spine were also generated.  COMPARISON:  CT head 12/29/2012  FINDINGS: CT HEAD FINDINGS  Generalized atrophy. Chronic microvascular ischemic changes in the white matter are stable.  Negative for acute infarct.  Negative for hemorrhage or mass.  Negative for fracture. Atherosclerotic disease. Prior eye surgery on the right.  CT CERVICAL SPINE FINDINGS  Disc degeneration and spondylosis throughout the cervical spine most severe at C3-4 and C4-5. Diffuse facet degeneration. There is multilevel spinal stenosis most prominent at C3-4 and C4-5.  Negative for fracture or mass.  IMPRESSION: No acute intracranial abnormality  Cervical spondylosis.  Negative for cervical fracture.   Electronically Signed   By: Franchot Gallo M.D.   On: 06/04/2014 20:07   Ct Abdomen Pelvis W Contrast  06/24/2014   CLINICAL DATA:  Diarrhea for the past 3 days. Shortness of breath. COPD. History of sickle cell anemia.  EXAM: CT ABDOMEN AND PELVIS WITH CONTRAST  TECHNIQUE: Multidetector CT imaging of the abdomen and pelvis was performed using the standard protocol following bolus administration of intravenous contrast.  CONTRAST:  51mL OMNIPAQUE IOHEXOL 300 MG/ML  SOLN  COMPARISON:  Radiographs obtained earlier  today. Previous CT examinations, the most recent dated 03/01/2013.  FINDINGS: Again demonstrated is diffusely heterogeneous enhancement of the kidneys with multiple peripheral and central areas of low density. No discrete mass is seen. There is initial mild diffuse heterogeneity of the liver, improved on the delayed images through the kidneys. Peripheral splenic calcifications are unchanged. Small calcified granulomata in the liver are again demonstrated.   Dense atheromatous arterial calcifications. Unremarkable bowel gas pattern. There are multiple mildly prominent loops of jejunum without abnormal dilatation. The pancreas, gallbladder adrenal glands and urinary bladder are unremarkable. No prostatic enlargement. Multiple areas of patchy bone sclerosis are again demonstrated. Mild bilateral lower lobe atelectasis. These involve the proximal femurs, pelvis and lumbar spine.  IMPRESSION: 1. No acute abnormality. 2. Stable heterogeneity of the kidneys. 3. Stable areas of bone sclerosis with an appearance most compatible with changes associated with the patient's sickle cell disease. Metastatic prostate cancer is less likely.   Electronically Signed   By: Enrique Sack M.D.   On: 06/24/2014 20:46   Dg Chest Portable 1 View  06/04/2014   CLINICAL DATA:  Weakness  EXAM: PORTABLE CHEST - 1 VIEW  COMPARISON:  03/06/2013  FINDINGS: COPD with pulmonary hyperinflation. Chronic scarring in the bases bilaterally is unchanged from prior studies. Negative for superimposed pneumonia or heart failure.  IMPRESSION: Chronic lung disease. No acute abnormality and no change from prior studies.   Electronically Signed   By: Franchot Gallo M.D.   On: 06/04/2014 18:26   Dg Abd 2 Views  06/24/2014   CLINICAL DATA:  Abdominal pain, diarrhea, shortness of breath, history COPD, sickle cell anemia  EXAM: ABDOMEN - 2 VIEW  COMPARISON:  12/29/2012  FINDINGS: Gas throughout nondistended small bowel loops and upper normal caliber colon.  No evidence of bowel obstruction or wall thickening.  No free intraperitoneal air.  Bones appear demineralized with noted bone infarcts in the proximal femora bilaterally.  Lung bases appear emphysematous but clear.  IMPRESSION: Nonobstructive bowel gas pattern.   Electronically Signed   By: Lavonia Dana M.D.   On: 06/24/2014 16:54    EKG:  SR rate 84 atrial enlargement normal ST segments   ASSESSMENT AND PLAN:  Chest Pain:  Non cardiac likely related to  pleural inflammation and COPD Lipase also elevated but no acute abdominal findings with history of pancreatitis.  No need for ischemic w/u.  Consider cdiff titers with recent hosptial stay Dyspnea:  CXR with hyperinflation left lung field COPD and pulmonary fibrosis History of PE  CT pending  Would appear to be pulmonary in nature  Echo to assess LV and RV function and estimate PA pressure.  Nebulizer per primary service  Patient has had weight loss and FTT  Code status and realistic long term goals should be addressed No cardiac w/u needed in hospital at this time  Signed: Jenkins Rouge 06/25/2014, 12:36 PM

## 2014-06-25 NOTE — H&P (Signed)
History and Physical    Anthony [name] GEZ:[id number] DOB: 1936-05-20 DOA: 06/24/2014  Referring physician: Starlyn Skeans, PA PCP: Philis Fendt, MD  Specialists: none  Chief Complaint: dyspnea  HPI: Anthony [name] is a 78 y.o. male has a past medical history significant for COPD, prior PE on chronic anticoagulation stopped last year due to GI bleeding, recently hospitalized and discharged 2 weeks ago with weakness, presents to the ED with weakness and dyspnea for 3 days. He was discharged on 8/10 to a SNF and did well in the past 2 weeks and d/c home 3 days ago on Friday. He states that things have been progressively worse since Friday evening and he started having profuse diarrhea with darker stools than normal. His diarrhea has improved somewhat but is still present. He also endorses weakness and dyspnea with minimal ambulation. He endorses chest "tightness" for the past 3 days as well with onset Friday evening. He denies any fever or chills, denies any wheezing, has a chronic cough which is non productive. Also endorses abdominal pain, peri-umbilical, which is new; no nausea or vomiting. In the ED patient with positive FOBT, Hb at 10.8 (improved from 9.8 on discharge), hyponatremia which is chronic. [facility] asked for admission for dyspnea.  Review of Systems: as per HPI otherwise negative  Past Medical History  Diagnosis Date  . COPD (chronic obstructive pulmonary disease)   . Sickle cell anemia   . Pulmonary embolism    Past Surgical History  Procedure Laterality Date  . No past surgeries    . Colonoscopy N/A 03/09/2013    Procedure: COLONOSCOPY;  Surgeon: Beryle Beams, MD;  Location: [place];  Service: Endoscopy;  Laterality: N/A;  . Esophagogastroduodenoscopy N/A 03/09/2013    Procedure: ESOPHAGOGASTRODUODENOSCOPY (EGD);  Surgeon: Beryle Beams, MD;  Location: [facility] ENDOSCOPY;  Service: Endoscopy;  Laterality: N/A;   Social History:  reports that he has been smoking.   He does not have any smokeless tobacco history on file. He reports that he does not drink alcohol. His drug history is not on file.  No Known Allergies  Family History  Problem Relation Age of Onset  . Sickle cell anemia Mother    Prior to Admission medications   Medication Sig Start Date End Date Taking? Authorizing Provider  albuterol-ipratropium (COMBIVENT) 18-103 MCG/ACT inhaler Inhale 2 puffs into the lungs 4 (four) times daily.   Yes Historical Provider, MD  ENSURE (ENSURE) Take 237 mLs by mouth 3 (three) times daily between meals.   Yes Historical Provider, MD  ferrous sulfate 325 (65 FE) MG tablet Take 325 mg by mouth 2 (two) times daily with a meal. 03/09/13  Yes Sorin C Laza, MD  fluticasone (FLONASE) 50 MCG/ACT nasal spray Place 2 sprays into the nose daily. 12/30/12  Yes Thurnell Lose, MD  pantoprazole (PROTONIX) 40 MG tablet Take 1 tablet (40 mg total) by mouth daily at 12 noon. 03/09/13  Yes Sorin June Leap, MD  Vitamin D, Ergocalciferol, (DRISDOL) 50000 UNITS CAPS Take 50,000 Units by mouth every 7 (seven) days. On mondays   Yes Historical Provider, MD   Physical Exam: Filed Vitals:   06/24/14 1419 06/24/14 1941 06/24/14 2214 06/25/14 0007  BP: 187/94 164/73 114/73   Pulse: 85 90 101   Temp: 97.7 F (36.5 C)     TempSrc: Oral     Resp: 20 20 20    SpO2: 100% 100% 100% 100%     General:  No apparent distress  Eyes:  no scleral icterus  ENT: moist oropharynx  Neck: supple, no JVD  Cardiovascular: regular rate without MRG; 2+ peripheral pulses  Respiratory: decreased breath sounds throughout, no wheezing  Abdomen: soft, tender to palpation epigastric area, positive bowel sounds, no guarding, no rebound  Skin: no rashes  Musculoskeletal: no peripheral edema  Neurologic: non focal  Labs on Admission:  Basic Metabolic Panel:  Recent Labs Lab 06/24/14 1445  NA 130*  K 4.8  CL 89*  CO2 30  GLUCOSE 84  BUN 6  CREATININE 0.79  CALCIUM 9.7   Liver Function  Tests:  Recent Labs Lab 06/24/14 1445  AST 33  ALT 10  ALKPHOS 59  BILITOT 0.9  PROT 8.2  ALBUMIN 3.6    Recent Labs Lab 06/24/14 1445  LIPASE 81*   CBC:  Recent Labs Lab 06/24/14 1445  WBC 4.9  NEUTROABS 2.7  HGB 10.8*  HCT 33.5*  MCV 77.2*  PLT 151   CBG:  Recent Labs Lab 06/24/14 1539  GLUCAP 78    Radiological Exams on Admission: Dg Chest 2 View  06/24/2014   CLINICAL DATA:  Short of breath, cough  EXAM: CHEST  2 VIEW  COMPARISON:  None.  FINDINGS: Normal cardiac silhouette. Lungs are hyperinflated. There is a chronic scarring at the right lung base. No pulmonary edema. No infiltrate. No pneumothorax.  IMPRESSION: 1. Severe left hyperinflated lungs.  2. Chronic scarring at the right lung base.  3. No acute findings.   Electronically Signed   By: Suzy Bouchard M.D.   On: 06/24/2014 16:55   Ct Abdomen Pelvis W Contrast  06/24/2014   CLINICAL DATA:  Diarrhea for the past 3 days. Shortness of breath. COPD. History of sickle cell anemia.  EXAM: CT ABDOMEN AND PELVIS WITH CONTRAST  TECHNIQUE: Multidetector CT imaging of the abdomen and pelvis was performed using the standard protocol following bolus administration of intravenous contrast.  CONTRAST:  74mL OMNIPAQUE IOHEXOL 300 MG/ML  SOLN  COMPARISON:  Radiographs obtained earlier today. Previous CT examinations, the most recent dated 03/01/2013.  FINDINGS: Again demonstrated is diffusely heterogeneous enhancement of the kidneys with multiple peripheral and central areas of low density. No discrete mass is seen. There is initial mild diffuse heterogeneity of the liver, improved on the delayed images through the kidneys. Peripheral splenic calcifications are unchanged. Small calcified granulomata in the liver are again demonstrated.  Dense atheromatous arterial calcifications. Unremarkable bowel gas pattern. There are multiple mildly prominent loops of jejunum without abnormal dilatation. The pancreas, gallbladder adrenal  glands and urinary bladder are unremarkable. No prostatic enlargement. Multiple areas of patchy bone sclerosis are again demonstrated. Mild bilateral lower lobe atelectasis. These involve the proximal femurs, pelvis and lumbar spine.  IMPRESSION: 1. No acute abnormality. 2. Stable heterogeneity of the kidneys. 3. Stable areas of bone sclerosis with an appearance most compatible with changes associated with the patient's sickle cell disease. Metastatic prostate cancer is less likely.   Electronically Signed   By: Enrique Sack M.D.   On: 06/24/2014 20:46   Dg Abd 2 Views  06/24/2014   CLINICAL DATA:  Abdominal pain, diarrhea, shortness of breath, history COPD, sickle cell anemia  EXAM: ABDOMEN - 2 VIEW  COMPARISON:  12/29/2012  FINDINGS: Gas throughout nondistended small bowel loops and upper normal caliber colon.  No evidence of bowel obstruction or wall thickening.  No free intraperitoneal air.  Bones appear demineralized with noted bone infarcts in the proximal femora bilaterally.  Lung bases appear emphysematous but  clear.  IMPRESSION: Nonobstructive bowel gas pattern.   Electronically Signed   By: Lavonia Dana M.D.   On: 06/24/2014 16:54    Assessment/Plan Principal Problem:   Dyspnea Active Problems:   COPD   PULMONARY EMBOLISM, HX OF   Anemia   Abdominal pain   Hyponatremia   Protein-calorie malnutrition, severe   Diarrhea     #1 Chest tightness - of 3 days duration - will admit to telemetry and cycle CEs - EKG with non specific ST changes, poor tracing, will repeat tonight  #2 COPD - has had somewhat of a worse cough without purulent sputum production, not wheezing, no evidence of acute exacerbation, continue home medications  #3 Dyspnea - may be multifactorial, broad differential in this patient, he is extremely deconditioned, has just been in a SNF, has known COPD on chronic oxygen, will check Troponin and D dimer  #4 History of PE - his Coumadin was stopped last year due to GI  bleeding. He is currently having heme positive stools. Will check a D dimer for now, already received IV contrast this evening for CT abdomen pelvis  #5 Anemia - with heme positive stools; Hemoglobin is in fact better now than on his discharge, repeat CBC in am  #6 GI bleed - IV protonix - repeat CBC in am, probable GI consult in am, may be in the setting of ongoing diarrhea alone vs true GI bleed. - seen by Dr. Benson Norway while hospitalized last year, had an EGD with gastric AVMs and a CSY   #7 Diarrhea - recent hospitalization and a SNF stay - will check C diff  #8 Severe protein calorie malnutrition - continue nutritional supplements  #9 weakness - PT consult.  #10 hyponatremia - chronic, appears hypovolemic, NS and recheck BMP in am    Diet: regular  Fluids: none  DVT Prophylaxis: SCD  Code Status: Full  Family Communication: none  Disposition Plan: inpatient  Time spent: 65  This note has been created with Surveyor, quantity. Any transcriptional errors are unintentional.   Costin M. Cruzita Lederer, MD [facility] Pager [phone]  If 7PM-7AM, please contact night-coverage [email or web address] Password [facility] 06/25/2014, 12:29 AM

## 2014-06-25 NOTE — Progress Notes (Signed)
*  PRELIMINARY RESULTS* Echocardiogram 2D Echocardiogram has been performed.  Leavy Cella 06/25/2014, 11:14 AM

## 2014-06-25 NOTE — ED Notes (Signed)
Attempted to draw labs for troponin and D-dimer. Only able to draw enough blood for troponin. Mini lab tech to attempt to obtain D-dimer.

## 2014-06-25 NOTE — Evaluation (Signed)
Physical Therapy Evaluation Patient Details Name: Anthony [name] MRN: [id number] DOB: 1936/09/26 Today's Date: 06/25/2014   History of Present Illness  78 yo male admitted with dyspnea. Hx of sickle cell disease, COPD-O2 dep, PE. Pt is from home alone with personal care attendant 5x/week for 3 hours.   Clinical Impression  On eval, pt required Min assist for mobility-only able to take a few lateral steps toward [facility] with rolling walker. Generally deconditioned. Discussed d/c plan-pt states he plans to return home. Just discharged from SNF a few days prior to hospital admission. Pt lives alone but has aide 5x/week, 3 hours/day. Feel pt needs 24 hour supervision at home or needs to consider ALF placement at some point in the near future.     Follow Up Recommendations SNF;Supervision/Assistance - 24 hour (if pt will agree. Just discharged from SNF a few days ago. Pt stated " I couldn't do anything". )    Equipment Recommendations  3in1 (PT)    Recommendations for Other Services OT consult     Precautions / Restrictions Precautions Precautions: Fall Precaution Comments: O2 dep Restrictions Weight Bearing Restrictions: No      Mobility  Bed Mobility Overal bed mobility: Needs Assistance Bed Mobility: Supine to Sit;Sit to Supine     Supine to sit: Min assist Sit to supine: Min assist   General bed mobility comments: Assist for trunk and bil LEs. Increased time. Remained propped on R elbow for a short while at EOB before being able to achieve full upright posture.   Transfers Overall transfer level: Needs assistance Equipment used: Rolling walker (2 wheeled) Transfers: Sit to/from Stand Sit to Stand: Min assist         General transfer comment: Assist to rise, stabilize, control descent. VCs safety, technique, hand placement.   Ambulation/Gait Ambulation/Gait assistance: Min assist       Gait velocity: Side steps toward [facility] with use of walker. Remained on O2.  Fatigues easily.       Stairs            Wheelchair Mobility    Modified Rankin (Stroke Patients Only)       Balance Overall balance assessment: Needs assistance         Standing balance support: Bilateral upper extremity supported;During functional activity Standing balance-Leahy Scale: Poor                               Pertinent Vitals/Pain Pain Assessment: No/denies pain    Home Living Family/patient expects to be discharged to:: Private residence Living Arrangements: Alone Available Help at Discharge: Personal care attendant (5 days/week, 3 hours /day) Type of Home: Apartment Home Access: Level entry     Home Layout: One level Home Equipment: Walker - 4 wheels      Prior Function Level of Independence: Needs assistance   Gait / Transfers Assistance Needed: usually uses rollator and on 2L O2 [place]           Hand Dominance        Extremity/Trunk Assessment   Upper Extremity Assessment: Generalized weakness           Lower Extremity Assessment: Generalized weakness      Cervical / Trunk Assessment: Kyphotic  Communication   Communication: No difficulties  Cognition Arousal/Alertness: Awake/alert Behavior During Therapy: WFL for tasks assessed/performed Overall Cognitive Status: Within Functional Limits for tasks assessed  General Comments      Exercises        Assessment/Plan    PT Assessment Patient needs continued PT services  PT Diagnosis Difficulty walking;Generalized weakness   PT Problem List Decreased strength;Decreased activity tolerance;Decreased balance;Decreased mobility;Decreased knowledge of use of DME;Cardiopulmonary status limiting activity  PT Treatment Interventions DME instruction;Gait training;Functional mobility training;Therapeutic activities;Patient/family education;Balance training;Therapeutic exercise   PT Goals (Current goals can be found in the Care Plan  section) Acute Rehab PT Goals Patient Stated Goal: home PT Goal Formulation: With patient Time For Goal Achievement: 07/09/14 Potential to Achieve Goals: Fair    Frequency Min 3X/week   Barriers to discharge        Co-evaluation               End of Session Equipment Utilized During Treatment: Oxygen;Gait belt Activity Tolerance: Patient limited by fatigue Patient left: in bed;with call bell/phone within reach;with bed alarm set           Time: 0950-1004 PT Time Calculation (min): 14 min   Charges:   PT Evaluation $Initial PT Evaluation Tier I: 1 Procedure PT Treatments $Therapeutic Activity: 8-22 mins   PT G Codes:          Weston Anna, MPT Pager: [phone]

## 2014-06-25 NOTE — Progress Notes (Signed)
Follow up:   Notified by RN regarding elevated Troponin of 0.33. Reports EKG at 1509 yesterday afternoon noted w/ some non-specific ST elevations. Pt admitted and awaiting telemetry bed after presenting to ED w/ 3 day h/o "chest tightness", and increased dypsnea. Pt w/ past medical history significant for COPD (on chronic 02) and Sickle Cell Anemia. Pt also w/ h/o PE, though has not been hypoxic or tachycardic this evening and has already received IV contrast for CT abd/pelvis in ED. EKG repeated. Discussed with Dr Hal Hope who consulted Dr Tommi Rumps w/ cardiology service. Dr Tommi Rumps reviewed EKG and feels is unremarkable.  Pt is reported to have received relief on 2nd SL NTG. Assessment/Plan: 1. Elevated troponin in setting of chest tightness/pain: Will continue to cycle CE. Given heme + stools and h/o GI bleed will hold off on heparin for now. Pt has received a full strength ASA.  If CE continue to rise and Hb remains stable will consider heparin qtt. Given relief of chest tightness w/ SL NTG placing on NTG patch. Will change admission from telemetry to SDU. Repeat EKG in AM.  2. Dyspnea - may be multifactorial, broad differential in this patient, has known COPD on chronic 02. D-Dimer pending. Will defer CTA if elevated until pt able to receive IV contrast. Pt's Coumadin was stopped last year d/t GI bleed. Will defer anticoagulation for now. Will continue to monitor closely in SDU.   Jeryl Columbia, NP-C [facility] Pager [phone]

## 2014-06-25 NOTE — Progress Notes (Signed)
CARE MANAGEMENT NOTE 06/25/2014  Patient:  Anthony, [name]   Account Number:  [id number]  Date Initiated:  06/25/2014  Documentation initiated by:  DAVIS,RHONDA  Subjective/Objective Assessment:   pt with hx of p.e. and copd, home x3 days from snf/increased wob and chest tightness.     Action/Plan:   tbd ecg does suggest anterolateral infarct.   Anticipated DC Date:  06/28/2014   Anticipated DC Plan:  HOME/SELF CARE  In-house referral  NA      DC Planning Services  NA      [facility] Choice  NA   Choice offered to / List presented to:  NA      DME agency  NA     [place] arranged  NA      [place] agency  NA   Status of service:  In process, will continue to follow Medicare Important Message given?  NA - LOS <3 / Initial given by admissions (If response is "NO", the following Medicare IM given date fields will be blank) Date Medicare IM given:   Medicare IM given by:   Date Additional Medicare IM given:   Additional Medicare IM given by:    Discharge Disposition:    Per UR Regulation:  Reviewed for med. necessity/level of care/duration of stay  If discussed at [place] of Stay Meetings, dates discussed:    Comments:  Suanne Marker Davis,RN,BSN,CCM

## 2014-06-25 NOTE — Progress Notes (Signed)
[facility] PROGRESS NOTE   Anthony [name]:[id number] DOB: [birth year] DOA: 06/24/2014 PCP: Philis Fendt, MD  HPI/Subjective: Complains about abdominal pain, suprapubic.  Assessment/Plan: Principal Problem:   Dyspnea Active Problems:   COPD   PULMONARY EMBOLISM, HX OF   Anemia   Abdominal pain   Hyponatremia   Protein-calorie malnutrition, severe   Diarrhea   Chest pain    Chest tightness - of 3 days duration  -Cardiac enzymes slightly positive since yesterday, troponin peak is 0.35. -EKG with non specific ST changes, poor tracing. -Obtain 2-D echocardiogram and cardiology consultation.  GI bleed  -Stools positive for Hemoccult, patient hemoglobin is 10.8, last time he was discharged from the hospital 8/10 was 9.8. -Has history of AVMs, no bleeding since May of 2014. -Patient said his stools turned black on Friday, continues to have low MCV likely chronic blood loss.  COPD -Not wheezing, no evidence of acute exacerbation, continue home medications.   Dyspnea - may be multifactorial, broad differential in this patient, he is extremely deconditioned, has just been in a SNF, has known COPD on chronic oxygen, will check Troponin and D dimer   History of PE - his Coumadin was stopped last year due to GI bleeding. He is currently having heme positive stools. Will check a D dimer for now, already received IV contrast this evening for CT abdomen pelvis   Anemia - with heme positive stools; Hemoglobin is in fact better now than on his discharge, repeat CBC in am   Diarrhea - recent hospitalization and a SNF stay  - will check C diff   Severe protein calorie malnutrition - continue nutritional supplements   Weakness - PT consult.   Hyponatremia - chronic, appears hypovolemic, NS and recheck BMP in am  Failure to thrive -Patient weight was 115 on May of 2014, patient weighs 93 pounds on admission this time (lost 22 pounds). -Patient is cachectic,  chronically ill looking, poor oral intake and anorexia.  -Although abdominal CT scan did not show any intra-abdominal abnormalities. -Has chronic sclerotic bony lesions, history of PE and weight loss, malignancy cannot be ruled out. -I will check age-appropriate cancer screening including PSA, alpha-fetoprotein and CEA.   Code Status: Full code Family Communication: Plan discussed with the patient. Disposition Plan: Remains inpatient   Consultants:   none  Procedures:  None  Antibiotics:  None   Objective: Filed Vitals:   06/25/14 0829  BP: 141/71  Pulse: 85  Temp:   Resp: 20    Intake/Output Summary (Last 24 hours) at 06/25/14 0956 Last data filed at 06/25/14 0800  Gross per 24 hour  Intake 163.33 ml  Output    125 ml  Net  38.33 ml   Filed Weights   06/25/14 0544  Weight: 42.5 kg (93 lb 11.1 oz)    Exam: General: Alert and awake, oriented x3, not in any acute distress. HEENT: anicteric sclera, pupils reactive to light and accommodation, EOMI CVS: S1-S2 clear, no murmur rubs or gallops Chest: clear to auscultation bilaterally, no wheezing, rales or rhonchi Abdomen: soft nontender, nondistended, normal bowel sounds, no organomegaly Extremities: no cyanosis, clubbing or edema noted bilaterally Neuro: Cranial nerves II-XII intact, no focal neurological deficits  Data Reviewed: Basic Metabolic Panel:  Recent Labs Lab 06/24/14 1445 06/25/14 0509  NA 130* 132*  K 4.8 4.4  CL 89* 92*  CO2 30 29  GLUCOSE 84 83  BUN 6 7  CREATININE 0.79 0.74  CALCIUM 9.7 9.1   Liver  Function Tests:  Recent Labs Lab 06/24/14 1445  AST 33  ALT 10  ALKPHOS 59  BILITOT 0.9  PROT 8.2  ALBUMIN 3.6    Recent Labs Lab 06/24/14 1445  LIPASE 81*   No results found for this basename: AMMONIA,  in the last 168 hours CBC:  Recent Labs Lab 06/24/14 1445 06/25/14 0509  WBC 4.9 6.2  NEUTROABS 2.7  --   HGB 10.8* 10.5*  HCT 33.5* 32.4*  MCV 77.2* 77.7*  PLT  151 140*   Cardiac Enzymes:  Recent Labs Lab 06/25/14 0139 06/25/14 0358  TROPONINI 0.33* 0.35*   BNP (last 3 results) No results found for this basename: PROBNP,  in the last 8760 hours CBG:  Recent Labs Lab 06/24/14 1539  GLUCAP 78    Micro Recent Results (from the past 240 hour(s))  MRSA PCR SCREENING     Status: None   Collection Time    06/25/14  6:08 AM      Result Value Ref Range Status   MRSA by PCR NEGATIVE  NEGATIVE Final   Comment:            The GeneXpert MRSA Assay (FDA     approved for NASAL specimens     only), is one component of a     comprehensive MRSA colonization     surveillance program. It is not     intended to diagnose MRSA     infection nor to guide or     monitor treatment for     MRSA infections.     Studies: Dg Chest 2 View  06/24/2014   CLINICAL DATA:  Short of breath, cough  EXAM: CHEST  2 VIEW  COMPARISON:  None.  FINDINGS: Normal cardiac silhouette. Lungs are hyperinflated. There is a chronic scarring at the right lung base. No pulmonary edema. No infiltrate. No pneumothorax.  IMPRESSION: 1. Severe left hyperinflated lungs.  2. Chronic scarring at the right lung base.  3. No acute findings.   Electronically Signed   By: Suzy Bouchard M.D.   On: 06/24/2014 16:55   Ct Abdomen Pelvis W Contrast  06/24/2014   CLINICAL DATA:  Diarrhea for the past 3 days. Shortness of breath. COPD. History of sickle cell anemia.  EXAM: CT ABDOMEN AND PELVIS WITH CONTRAST  TECHNIQUE: Multidetector CT imaging of the abdomen and pelvis was performed using the standard protocol following bolus administration of intravenous contrast.  CONTRAST:  52mL OMNIPAQUE IOHEXOL 300 MG/ML  SOLN  COMPARISON:  Radiographs obtained earlier today. Previous CT examinations, the most recent dated 03/01/2013.  FINDINGS: Again demonstrated is diffusely heterogeneous enhancement of the kidneys with multiple peripheral and central areas of low density. No discrete mass is seen. There  is initial mild diffuse heterogeneity of the liver, improved on the delayed images through the kidneys. Peripheral splenic calcifications are unchanged. Small calcified granulomata in the liver are again demonstrated.  Dense atheromatous arterial calcifications. Unremarkable bowel gas pattern. There are multiple mildly prominent loops of jejunum without abnormal dilatation. The pancreas, gallbladder adrenal glands and urinary bladder are unremarkable. No prostatic enlargement. Multiple areas of patchy bone sclerosis are again demonstrated. Mild bilateral lower lobe atelectasis. These involve the proximal femurs, pelvis and lumbar spine.  IMPRESSION: 1. No acute abnormality. 2. Stable heterogeneity of the kidneys. 3. Stable areas of bone sclerosis with an appearance most compatible with changes associated with the patient's sickle cell disease. Metastatic prostate cancer is less likely.   Electronically Signed  By: Enrique Sack M.D.   On: 06/24/2014 20:46   Dg Abd 2 Views  06/24/2014   CLINICAL DATA:  Abdominal pain, diarrhea, shortness of breath, history COPD, sickle cell anemia  EXAM: ABDOMEN - 2 VIEW  COMPARISON:  12/29/2012  FINDINGS: Gas throughout nondistended small bowel loops and upper normal caliber colon.  No evidence of bowel obstruction or wall thickening.  No free intraperitoneal air.  Bones appear demineralized with noted bone infarcts in the proximal femora bilaterally.  Lung bases appear emphysematous but clear.  IMPRESSION: Nonobstructive bowel gas pattern.   Electronically Signed   By: Lavonia Dana M.D.   On: 06/24/2014 16:54    Scheduled Meds: . antiseptic oral rinse  7 mL Mouth Rinse BID  . feeding supplement (ENSURE COMPLETE)  237 mL Oral TID BM  . ferrous sulfate  325 mg Oral BID WC  . fluticasone  2 spray Each Nare Daily  . ipratropium-albuterol  3 mL Nebulization QID  . nitroGLYCERIN  0.4 mg Transdermal Daily  . pantoprazole (PROTONIX) IV  40 mg Intravenous Q12H  . sodium chloride   3 mL Intravenous Q12H   Continuous Infusions: . sodium chloride 50 mL/hr at 06/25/14 0544  . sodium chloride 50 mL/hr at 06/25/14 0249       Time spent: 35 minutes    [facility]  [facility] Pager [phone] If 7PM-7AM, please contact night-coverage at [email or web address], password [facility] 06/25/2014, 9:56 AM  LOS: 1 day

## 2014-06-25 NOTE — ED Notes (Signed)
After 2nd nitro SL patient reports "the second one gave me a lot of relief". Berneice Heinrich, MD with [facility], made aware of same. Physician to put in orders to have patient admitted to step down unit. Patient made aware of same. Verbalizes understanding.

## 2014-06-25 NOTE — ED Notes (Signed)
mulitple attempts to draw d-dimer but not enough blood return.

## 2014-06-25 NOTE — ED Notes (Signed)
Attempted to page admitting physician about patient's EKG and troponin. Admitting physician has gone home. Secretary paging other admitting physician.

## 2014-06-26 DIAGNOSIS — R197 Diarrhea, unspecified: Secondary | ICD-10-CM

## 2014-06-26 DIAGNOSIS — R0789 Other chest pain: Secondary | ICD-10-CM

## 2014-06-26 DIAGNOSIS — R1084 Generalized abdominal pain: Secondary | ICD-10-CM

## 2014-06-26 LAB — COMPREHENSIVE METABOLIC PANEL
ALT: 7 U/L (ref 0–53)
AST: 23 U/L (ref 0–37)
Albumin: 3.1 g/dL — ABNORMAL LOW (ref 3.5–5.2)
Alkaline Phosphatase: 50 U/L (ref 39–117)
Anion gap: 11 (ref 5–15)
BUN: 11 mg/dL (ref 6–23)
CO2: 30 meq/L (ref 19–32)
CREATININE: 0.86 mg/dL (ref 0.50–1.35)
Calcium: 9.2 mg/dL (ref 8.4–10.5)
Chloride: 97 mEq/L (ref 96–112)
GFR calc Af Amer: >90 mL/min (ref >90)
GFR, EST NON AFRICAN AMERICAN: 82 mL/min — AB (ref >90)
Glucose, Bld: 88 mg/dL (ref 70–99)
Potassium: 4.5 mEq/L (ref 3.7–5.3)
SODIUM: 138 meq/L (ref 137–147)
TOTAL PROTEIN: 6.9 g/dL (ref 6.0–8.3)
Total Bilirubin: 1 mg/dL (ref 0.3–1.2)

## 2014-06-26 LAB — LACTIC ACID, PLASMA: Lactic Acid, Venous: 1.3 mmol/L (ref 0.5–2.2)

## 2014-06-26 LAB — CBC
HEMATOCRIT: 28.2 % — AB (ref 39.0–52.0)
HEMOGLOBIN: 9.2 g/dL — AB (ref 13.0–17.0)
MCH: 25.3 pg — AB (ref 26.0–34.0)
MCHC: 32.6 g/dL (ref 30.0–36.0)
MCV: 77.5 fL — ABNORMAL LOW (ref 78.0–100.0)
PLATELETS: 140 10*3/uL — AB (ref 150–400)
RBC: 3.64 MIL/uL — AB (ref 4.22–5.81)
RDW: 22.9 % — ABNORMAL HIGH (ref 11.5–15.5)
WBC: 5.7 10*3/uL (ref 4.0–10.5)

## 2014-06-26 LAB — PSA: PSA: 0.58 ng/mL (ref <=4.00)

## 2014-06-26 LAB — AFP TUMOR MARKER: AFP-Tumor Marker: 4.9 ng/mL (ref <6.1)

## 2014-06-26 LAB — HEMOGLOBIN AND HEMATOCRIT, BLOOD
HCT: 28 % — ABNORMAL LOW (ref 39.0–52.0)
Hemoglobin: 9.2 g/dL — ABNORMAL LOW (ref 13.0–17.0)

## 2014-06-26 LAB — CEA: CEA: 2.2 ng/mL (ref 0.0–5.0)

## 2014-06-26 MED ORDER — ARFORMOTEROL TARTRATE 15 MCG/2ML IN NEBU
15.0000 ug | INHALATION_SOLUTION | Freq: Two times a day (BID) | RESPIRATORY_TRACT | Status: DC
  Administered 2014-06-26 – 2014-07-01 (×10): 15 ug via RESPIRATORY_TRACT
  Filled 2014-06-26 (×15): qty 2

## 2014-06-26 MED ORDER — LEVOFLOXACIN IN D5W 750 MG/150ML IV SOLN
750.0000 mg | INTRAVENOUS | Status: DC
  Administered 2014-06-26: 750 mg via INTRAVENOUS
  Filled 2014-06-26 (×2): qty 150

## 2014-06-26 MED ORDER — METHYLPREDNISOLONE SODIUM SUCC 40 MG IJ SOLR
40.0000 mg | Freq: Two times a day (BID) | INTRAMUSCULAR | Status: DC
  Administered 2014-06-26 – 2014-06-27 (×3): 40 mg via INTRAVENOUS
  Filled 2014-06-26 (×5): qty 1

## 2014-06-26 MED ORDER — BUDESONIDE 0.25 MG/2ML IN SUSP
0.2500 mg | Freq: Two times a day (BID) | RESPIRATORY_TRACT | Status: DC
  Administered 2014-06-26 – 2014-07-01 (×10): 0.25 mg via RESPIRATORY_TRACT
  Filled 2014-06-26 (×19): qty 2

## 2014-06-26 NOTE — Discharge Summary (Signed)
Physician Discharge Summary  Anthony [name] OVZ:[id number] DOB: 07/19/1936 DOA: 06/24/2014  PCP: Philis Fendt, MD  Admit date: 06/24/2014 Discharge date: 07/01/2014  Recommendations for Outpatient Follow-up:  1.  To SNF for ongoing PT/OT and speech therapy please 2.  Repeat BMP, CBC in 1 week to evaluate anemia and thrombocytopenia, sodium, creatinine 3.  Palliative care to follow  4.  Recommend Pulmonology, Dr. Melvyn Novas, Wed 9/2 at 2:30PM.  Please arrange for transportation to and from appointment. 5.  Continue prednisone 39m daily until follow up with Pulmonology 6.  Last dose of levofloxacin on 9/1, then stop   Discharge Diagnoses:  Principal Problem:   Dyspnea Active Problems:   COPD with acute exacerbation   PULMONARY EMBOLISM, HX OF   Anemia   Abdominal pain   Hyponatremia   Protein-calorie malnutrition, severe   Diarrhea   Chest pain   Generalized weakness   Discharge Condition: stable, improved  Diet recommendation: regular with supplements, ensure four times daily and magic cup twice daily  Wt Readings from Last 3 Encounters:  06/25/14 42.5 kg (93 lb 11.1 oz)  06/10/14 48.1 kg (106 lb 0.7 oz)  03/09/13 52.6 kg (115 lb 15.4 oz)    History of present illness:   78year old male with history of COPD, PE on chronic anticoagulation which was discontinued last year secondary to GI bleed, recently hospitalized and discharged 2 weeks ago who presented with weakness and shortness of breath for 3 days prior to admission. He stated that he had profuse diarrhea that was darker than usual with weakness and chest tightness. He denied fevers, chills, wheezing, increased cough, or changes in sputum. He also complained of abdominal pain without nausea or vomiting. He was a cold positive in the emergency department however his hemoglobin had improved from his previous discharge hemoglobin.  Hospital Course:   Chest tightness and dyspnea due to acute COPD exacerbation.   HE was  started on treatment for COPD exacerbation as below.  Because of his chest pain, he was evaluated for ACS.  Peak troponin of 0.35 was likely due to demand ischemia from difficulty breathing. His EKG demonstrated nonspecific ST segment changes.  Telemetry demonstrated normal sinus rhythm. He was seen by cardiology who recommended no further inpatient evaluation at this time and followup as an outpatient.  His d-dimer was 0.6, and he underwent CT anterior chest which was negative for pneumonia and for pulmonary embolism.  He continued to have difficulty breathing and was started on as needed morphine.    GI bleed, occult positive stool, hemoglobin trended down to 8.8 mg/dl on 8/27.  He has history of AVMs but no bleeding since May of 2014. He has a microcytic anemia consistent with iron deficiency. He will need followup with gastroenterology once his breathing has improved.  His hemoglobin stabilized and improved to 9.6 mg/dl  Acute COPD exacerbation with chronic respiratory failure.  He has seen Pulmonology in the past for progressive COPD, last in 2010.  AT that time he was on several controller medications and prn combivent.  He was started on solumedrol, duonebs, brovana and budesonide.  Resumed symbicort with duonebs four times daily and albuterol every 2 hours as needed for shortness of breath.  He should continue prednisone 454mdaily for the next two days until he follows up with pulmonology.  Flutter valve if available.  Continue 3L [place] with escalation to 4L if needed with exertion.  Patient's cachexia is also likely contributing to his SOB.    History  of pulmonary embolism, no longer on anticoagulation secondary to GI bleed. No acute PE on CT anterior chest.  Iron deficiency anemia, started on iron supplementation.  Diarrhea with abdominal pain, C. difficile not obtained because the patient no longer had diarrhea upon admission.   liver function tests and urinalysis within normal limits except for  albumin of 3.1.  Lipase 81, decreased from prior admissions.  He was started on laxatives and stool softeners for constipation.    Severe protein calorie malnutrition with a 22 pound weight loss since last year and losing more rapidly over last month.  I suspect that he has an underlying malignancy or this is due to end-stage COPD.  PSA, CEA within normal limits. AFP neg.  He is too dyspneic to undergo another GI evaluation (last was about 1 year ago).  He was given a regular diet and encouraged to use supplements, ensure, magic cups.  He was started on megace.  He met with speech therapy to discuss safe eating/swallowing practices.  I think he would be a good hospice candidate given his progressive decline and cachexia with difficulty eating adequate calories, however, he declined palliative care consult in the hospital and declined hospice.  He would like palliative care to follow along with him at SNF, however.  We discussed code status and he is DNR/DNI.  Weakness, likely secondary to severe malnutrition, physical therapy recommending skilled nursing for ongoing PT/OT.  Hyponatremia, chronic likely secondary to recent osmostat and dehydration, and resolved with IVF.   Thrombocytopenia, platelets stable in low 100s.  Repeat CBC in 1 week.    Consultants:  none Procedures:  None Antibiotics:  Levofloxacin 8/26 >>   Discharge Exam: Filed Vitals:   07/01/14 0504  BP: 98/48  Pulse: 101  Temp: 98.4 F (36.9 C)  Resp: 16   Filed Vitals:   06/30/14 2053 06/30/14 2306 07/01/14 0504 07/01/14 0839  BP: 113/50  98/48   Pulse: 102  101   Temp: 98.9 F (37.2 C)  98.4 F (36.9 C)   TempSrc: Oral  Oral   Resp: 20  16   Height:      Weight:      SpO2: 100% 100% 100% 99%    General: Cachectic BM, lying in bed, purse-lip breathing  HEENT: NCAT, MMM  Cardiovascular: RRR, nl S1, S2 no mrg, 2+ pulses, warm extremities  Respiratory: Diminished bilateral BS to apices, high pitched wheeze, no  rhonchi, or rales, tachypneic  Abdomen: NABS, soft, ND, TTP with guarding suprapubic area  MSK: Normal tone and bulk, no LEE  Neuro: Grossly moves all extremities   Discharge Instructions      Discharge Instructions   Call MD for:  difficulty breathing, headache or visual disturbances    Complete by:  As directed      Call MD for:  extreme fatigue    Complete by:  As directed      Call MD for:  hives    Complete by:  As directed      Call MD for:  persistant dizziness or light-headedness    Complete by:  As directed      Call MD for:  persistant nausea and vomiting    Complete by:  As directed      Call MD for:  severe uncontrolled pain    Complete by:  As directed      Call MD for:  temperature >100.4    Complete by:  As directed  Diet general    Complete by:  As directed      Increase activity slowly    Complete by:  As directed             Medication List    STOP taking these medications       albuterol-ipratropium 18-103 MCG/ACT inhaler  Commonly known as:  COMBIVENT  Replaced by:  ipratropium-albuterol 0.5-2.5 (3) MG/3ML Soln      TAKE these medications       albuterol (2.5 MG/3ML) 0.083% nebulizer solution  Commonly known as:  PROVENTIL  Take 3 mLs (2.5 mg total) by nebulization every 4 (four) hours as needed for wheezing or shortness of breath.     bisacodyl 10 MG suppository  Commonly known as:  DULCOLAX  Place 1 suppository (10 mg total) rectally daily as needed for moderate constipation.     budesonide-formoterol 160-4.5 MCG/ACT inhaler  Commonly known as:  SYMBICORT  Inhale 2 puffs into the lungs 2 (two) times daily.     DSS 100 MG Caps  Take 100 mg by mouth 2 (two) times daily.     ENSURE  Take 237 mLs by mouth 4 (four) times daily -  with meals and at bedtime.     ferrous sulfate 325 (65 FE) MG tablet  Take 325 mg by mouth 2 (two) times daily with a meal.     fluticasone 50 MCG/ACT nasal spray  Commonly known as:  FLONASE  Place 2  sprays into the nose daily.     ipratropium-albuterol 0.5-2.5 (3) MG/3ML Soln  Commonly known as:  DUONEB  Take 3 mLs by nebulization 4 (four) times daily -  before meals and at bedtime.     levofloxacin 750 MG tablet  Commonly known as:  LEVAQUIN  Take 1 tablet (750 mg total) by mouth every other day.  Start taking on:  07/02/2014     megestrol 400 MG/10ML suspension  Commonly known as:  MEGACE  Take 10 mLs (400 mg total) by mouth daily.     morphine 15 MG tablet  Commonly known as:  MSIR  Take 1 tablet (15 mg total) by mouth every 4 (four) hours as needed for moderate pain or severe pain (or shortness of breath).     pantoprazole 40 MG tablet  Commonly known as:  PROTONIX  Take 1 tablet (40 mg total) by mouth daily at 12 noon.     polyethylene glycol packet  Commonly known as:  MIRALAX / GLYCOLAX  Take 17 g by mouth 2 (two) times daily.     predniSONE 20 MG tablet  Commonly known as:  DELTASONE  Take 2 tablets (40 mg total) by mouth daily with breakfast.     senna 8.6 MG Tabs tablet  Commonly known as:  SENOKOT  Take 2 tablets (17.2 mg total) by mouth at bedtime.     sodium chloride 0.65 % Soln nasal spray  Commonly known as:  OCEAN  Place 1 spray into both nostrils as needed for congestion.     Vitamin D (Ergocalciferol) 50000 UNITS Caps capsule  Commonly known as:  DRISDOL  Take 50,000 Units by mouth every 7 (seven) days. On mondays       Follow-up Information   Follow up with AVBUERE,EDWIN A, MD. Schedule an appointment as soon as possible for a visit in 1 month.   Specialty:  Internal Medicine   Contact information:   [address] [place] [address] [phone]  Follow up with Christinia Gully, MD. Schedule an appointment as soon as possible for a visit on 07/03/2014. (2:30PM)    Specialty:  Pulmonary Disease   Contact information:   [address] [place] [address] [phone]        The results of significant diagnostics from this  hospitalization (including imaging, microbiology, ancillary and laboratory) are listed below for reference.    Significant Diagnostic Studies: Ct Abdomen Pelvis Wo Contrast  06/04/2014   CLINICAL DATA:  Loss of appetite.  Weakness  EXAM: CT ABDOMEN AND PELVIS WITHOUT CONTRAST  TECHNIQUE: Multidetector CT imaging of the abdomen and pelvis was performed following the standard protocol without IV contrast.  COMPARISON:  CT abdomen pelvis 03/01/2013  FINDINGS: Bibasilar scarring as noted previously.  Unenhanced images of the liver are normal. Calcified granulomata in the liver and spleen. Kidneys reveal no mass or obstruction. Pancreas is negative.  Thickening of the gastric antrum may be due to incomplete distention versus gastritis. No focal mass. No bowel obstruction. No free fluid or adenopathy. Mild sigmoid diverticulosis.  The patient has lost considerable weight since the prior CT.  IMPRESSION: Thickening of the gastric antrum which may be due to gastritis or incomplete distention. Otherwise no acute abnormality  Sigmoid diverticulosis  Pulmonary fibrosis.   Electronically Signed   By: Franchot Gallo M.D.   On: 06/04/2014 21:32   Dg Chest 2 View  06/24/2014   CLINICAL DATA:  Liset Mcmonigle of breath, cough  EXAM: CHEST  2 VIEW  COMPARISON:  None.  FINDINGS: Normal cardiac silhouette. Lungs are hyperinflated. There is a chronic scarring at the right lung base. No pulmonary edema. No infiltrate. No pneumothorax.  IMPRESSION: 1. Severe left hyperinflated lungs.  2. Chronic scarring at the right lung base.  3. No acute findings.   Electronically Signed   By: Suzy Bouchard M.D.   On: 06/24/2014 16:55   Ct Head Wo Contrast  06/04/2014   CLINICAL DATA:  Head and neck pain. Weakness. Fell several days ago  EXAM: CT HEAD WITHOUT CONTRAST  CT CERVICAL SPINE WITHOUT CONTRAST  TECHNIQUE: Multidetector CT imaging of the head and cervical spine was performed following the standard protocol without intravenous contrast.  Multiplanar CT image reconstructions of the cervical spine were also generated.  COMPARISON:  CT head 12/29/2012  FINDINGS: CT HEAD FINDINGS  Generalized atrophy. Chronic microvascular ischemic changes in the white matter are stable.  Negative for acute infarct.  Negative for hemorrhage or mass.  Negative for fracture. Atherosclerotic disease. Prior eye surgery on the right.  CT CERVICAL SPINE FINDINGS  Disc degeneration and spondylosis throughout the cervical spine most severe at C3-4 and C4-5. Diffuse facet degeneration. There is multilevel spinal stenosis most prominent at C3-4 and C4-5.  Negative for fracture or mass.  IMPRESSION: No acute intracranial abnormality  Cervical spondylosis.  Negative for cervical fracture.   Electronically Signed   By: Franchot Gallo M.D.   On: 06/04/2014 20:07   Ct Angio Chest Pe W/cm &/or Wo Cm  06/25/2014   CLINICAL DATA:  Shortness of breath, chest pain, evaluate for PE  EXAM: CT ANGIOGRAPHY CHEST WITH CONTRAST  TECHNIQUE: Multidetector CT imaging of the chest was performed using the standard protocol during bolus administration of intravenous contrast. Multiplanar CT image reconstructions and MIPs were obtained to evaluate the vascular anatomy.  CONTRAST:  82mL OMNIPAQUE IOHEXOL 350 MG/ML SOLN  COMPARISON:  Chest radiographs dated 06/24/2014  FINDINGS: No evidence of pulmonary embolism.  Severe emphysematous changes.  Patchy opacities in the bilateral lung bases, likely atelectasis, pneumonia not excluded. No pleural effusion or pneumothorax.  The heart is normal in size.  No pericardial effusion.  No suspicious mediastinal, hilar, or axillary lymphadenopathy.  Visualized upper abdomen is notable for a stable heterogeneous appearance of the right kidney, unchanged from recent CT.  Mild degenerative changes of the visualized thoracolumbar spine. Stable endplate changes of multiple mid thoracic vertebral bodies related to known sickle cell disease.  Review of the MIP images  confirms the above findings.  IMPRESSION: No evidence of pulmonary embolism.  Severe emphysematous changes.  Patchy opacities in the bilateral lung bases, likely atelectasis, pneumonia not excluded.   Electronically Signed   By: Julian Hy M.D.   On: 06/25/2014 12:49   Ct Cervical Spine Wo Contrast  06/04/2014   CLINICAL DATA:  Head and neck pain. Weakness. Fell several days ago  EXAM: CT HEAD WITHOUT CONTRAST  CT CERVICAL SPINE WITHOUT CONTRAST  TECHNIQUE: Multidetector CT imaging of the head and cervical spine was performed following the standard protocol without intravenous contrast. Multiplanar CT image reconstructions of the cervical spine were also generated.  COMPARISON:  CT head 12/29/2012  FINDINGS: CT HEAD FINDINGS  Generalized atrophy. Chronic microvascular ischemic changes in the white matter are stable.  Negative for acute infarct.  Negative for hemorrhage or mass.  Negative for fracture. Atherosclerotic disease. Prior eye surgery on the right.  CT CERVICAL SPINE FINDINGS  Disc degeneration and spondylosis throughout the cervical spine most severe at C3-4 and C4-5. Diffuse facet degeneration. There is multilevel spinal stenosis most prominent at C3-4 and C4-5.  Negative for fracture or mass.  IMPRESSION: No acute intracranial abnormality  Cervical spondylosis.  Negative for cervical fracture.   Electronically Signed   By: Franchot Gallo M.D.   On: 06/04/2014 20:07   Ct Abdomen Pelvis W Contrast  06/24/2014   CLINICAL DATA:  Diarrhea for the past 3 days. Shortness of breath. COPD. History of sickle cell anemia.  EXAM: CT ABDOMEN AND PELVIS WITH CONTRAST  TECHNIQUE: Multidetector CT imaging of the abdomen and pelvis was performed using the standard protocol following bolus administration of intravenous contrast.  CONTRAST:  56m OMNIPAQUE IOHEXOL 300 MG/ML  SOLN  COMPARISON:  Radiographs obtained earlier today. Previous CT examinations, the most recent dated 03/01/2013.  FINDINGS: Again  demonstrated is diffusely heterogeneous enhancement of the kidneys with multiple peripheral and central areas of low density. No discrete mass is seen. There is initial mild diffuse heterogeneity of the liver, improved on the delayed images through the kidneys. Peripheral splenic calcifications are unchanged. Small calcified granulomata in the liver are again demonstrated.  Dense atheromatous arterial calcifications. Unremarkable bowel gas pattern. There are multiple mildly prominent loops of jejunum without abnormal dilatation. The pancreas, gallbladder adrenal glands and urinary bladder are unremarkable. No prostatic enlargement. Multiple areas of patchy bone sclerosis are again demonstrated. Mild bilateral lower lobe atelectasis. These involve the proximal femurs, pelvis and lumbar spine.  IMPRESSION: 1. No acute abnormality. 2. Stable heterogeneity of the kidneys. 3. Stable areas of bone sclerosis with an appearance most compatible with changes associated with the patient's sickle cell disease. Metastatic prostate cancer is less likely.   Electronically Signed   By: SEnrique SackM.D.   On: 06/24/2014 20:46   Dg Chest Portable 1 View  06/04/2014   CLINICAL DATA:  Weakness  EXAM: PORTABLE CHEST - 1 VIEW  COMPARISON:  03/06/2013  FINDINGS: COPD with pulmonary hyperinflation. Chronic scarring in the  bases bilaterally is unchanged from prior studies. Negative for superimposed pneumonia or heart failure.  IMPRESSION: Chronic lung disease. No acute abnormality and no change from prior studies.   Electronically Signed   By: Franchot Gallo M.D.   On: 06/04/2014 18:26   Dg Abd 2 Views  06/24/2014   CLINICAL DATA:  Abdominal pain, diarrhea, shortness of breath, history COPD, sickle cell anemia  EXAM: ABDOMEN - 2 VIEW  COMPARISON:  12/29/2012  FINDINGS: Gas throughout nondistended small bowel loops and upper normal caliber colon.  No evidence of bowel obstruction or wall thickening.  No free intraperitoneal air.  Bones  appear demineralized with noted bone infarcts in the proximal femora bilaterally.  Lung bases appear emphysematous but clear.  IMPRESSION: Nonobstructive bowel gas pattern.   Electronically Signed   By: Lavonia Dana M.D.   On: 06/24/2014 16:54    Microbiology: Recent Results (from the past 240 hour(s))  MRSA PCR SCREENING     Status: None   Collection Time    06/25/14  6:08 AM      Result Value Ref Range Status   MRSA by PCR NEGATIVE  NEGATIVE Final   Comment:            The GeneXpert MRSA Assay (FDA     approved for NASAL specimens     only), is one component of a     comprehensive MRSA colonization     surveillance program. It is not     intended to diagnose MRSA     infection nor to guide or     monitor treatment for     MRSA infections.     Labs: Basic Metabolic Panel:  Recent Labs Lab 06/26/14 0406 06/27/14 0430 06/28/14 0445 06/29/14 0426 06/30/14 0450 06/30/14 0452  NA 138 134* 136* 135* 134*  --   K 4.5 5.3 4.5 5.6* 5.7*  --   CL 97 94* 95* 93* 90*  --   CO2 30 30 34* 37* 38*  --   GLUCOSE 88 231* 87 140* 143*  --   BUN _0 24*  --   CREATININE 0.86 0.94 0.94 0.85 0.91  --   CALCIUM 9.2 9.1 9.3 9.3 9.4  --   MG  --   --  1.5 2.0  --  1.9  PHOS  --   --  2.4 3.0 2.4  --    Liver Function Tests:  Recent Labs Lab 06/24/14 1445 06/26/14 0406 06/27/14 0430 06/28/14 0445 06/29/14 0426 06/30/14 0450  AST 33 23 20  --   --   --   ALT _1 --   --   --   ALKPHOS 59 50 47  --   --   --   BILITOT 0.9 1.0 0.8  --   --   --   PROT 8.2 6.9 7.1  --   --   --   ALBUMIN 3.6 3.1* 3.0* 3.0* 3.0* 2.9*    Recent Labs Lab 06/24/14 1445  LIPASE 81*   No results found for this basename: AMMONIA,  in the last 168 hours CBC:  Recent Labs Lab 06/24/14 1445  06/26/14 0406 06/26/14 1059 06/27/14 0430 06/28/14 0445 06/29/14 0426 06/30/14 0452  WBC 4.9  < > 5.7  --  3.2* 4.6 4.8 6.7  NEUTROABS 2.7  --   --   --   --   --   --   --   HGB 10.8*  < >  9.2* 9.2* 8.8* 7.6* 9.7* 9.6*  HCT 33.5*  < > 28.2* 28.0* 26.9* 22.6* 29.0* 29.5*  MCV 77.2*  < > 77.5*  --  77.3* 76.1* 81.5 81.5  PLT 151  < > 140*  --  126* 97* 114* 107*  < > = values in this interval not displayed. Cardiac Enzymes:  Recent Labs Lab 06/25/14 0139 06/25/14 0358 06/25/14 1014 06/25/14 1600  TROPONINI 0.33* 0.35* <0.30 <0.30   BNP: BNP (last 3 results) No results found for this basename: PROBNP,  in the last 8760 hours CBG:  Recent Labs Lab 06/24/14 1539 06/28/14 1646 06/28/14 2105  GLUCAP 78 119* 159*    Time coordinating discharge: 45 minutes including scheduling follow up appointments  Signed:  Janece Canterbury  [facility] 07/01/2014, 12:05 PM

## 2014-06-26 NOTE — Progress Notes (Signed)
[facility] PROGRESS NOTE  Anthony [name] DPO:[id number] DOB: 1936/01/19 DOA: 06/24/2014 PCP: Anthony Fendt, MD  Assessment/Plan  Chest tightness and dyspnea, peak troponin of 0.35. His EKG demonstrated nonspecific ST segment changes. Telemetry demonstrated normal sinus rhythm. He was seen by cardiology who recommended no further inpatient evaluation at this time and followup as an outpatient. His d-dimer was 0.6, and he underwent CT anterior chest which was negative for pneumonia and for pulmonary embolism.  Most likely secondary to COPD and severe protein calorie malnutrition with cachexia -  Treat for acute COPD exacerbation -  Encouraged him to eat liberally and take his supplements -  If despite these interventions he continues to be Anthony [name] of breath, recommend palliative care consult for symptomatic management and assessment for hospice  GI bleed, occult positive stool, hemoglobin trended down to 9.2  -  Repeat H&H this afternoon -   F/u with gastroenterology.   COPD on chronic oxygen with acute exacerbation -  Start solumedrol and levofloxacin -  Continue duonebs -  Start budesonide and brovana for now, but convert to [facility] or symbicort or advair at discharge  History of pulmonary embolism, no longer on anticoagulation secondary to GI bleed. No acute PE on CT anterior chest.   Iron deficiency anemia, started on iron supplementation.   Diarrhea with suprapubic abdominal pain, C. difficile not obtained because the patient no longer having diarrhea. liver function tests and urinalysis within normal limits except for albumin of 3.1. Lipase 81, decreased from prior admissions.  -  PVR -  Check lactic acid   Severe protein calorie malnutrition with a 22 pound weight loss since last year. PSA, CEA within normal limits. AFP is pending.  Has early satiety -  Regular diet and encouraged to use supplements -  Consider palliative care consultation to follow at SNF -  Code status  not readdressed today  Weakness, likely secondary to malnutrition, physical therapy recommending skilled nursing for ongoing PT/OT.   Hyponatremia, chronic likely secondary to recent osmostat and dehydration, and resolved with IVF.   Thrombocytopenia, resolving. Repeat CBC in 1 week.   Diet:  regular Access:  PIV IVF:  off Proph:  SCD  Code Status: full Family Communication: patient alone Disposition Plan: pending improvement in dyspnea   Consultants:  cardiology  Procedures:  KUB  CXR  CT abd/pelvis  CT chest   Antibiotics:  Levofloxacin    HPI/Subjective:  Still having chest tightness and SOB, not improved.  Denies nausea, vomiting, diarrhea, constipation since admission, but still having severe lower abdominal pain.    Objective: Filed Vitals:   06/25/14 1930 06/25/14 2236 06/25/14 2351 06/26/14 0810  BP:  121/77    Pulse:  87    Temp:  97.3 F (36.3 C)    TempSrc:  Oral    Resp:  18    Height:      Weight:      SpO2: 100% 99% 98% 97%    Intake/Output Summary (Last 24 hours) at 06/26/14 1048 Last data filed at 06/26/14 4315  Gross per 24 hour  Intake   1140 ml  Output    850 ml  Net    290 ml   Filed Weights   06/25/14 0544  Weight: 42.5 kg (93 lb 11.1 oz)    Exam:   General:  Cachectic BM, No acute distress  HEENT:  NCAT, MMM  Cardiovascular:  RRR, nl S1, S2 no mrg, 2+ pulses, warm extremities  Respiratory:  Diminished bilateral  BS, no wheezes, rhonchi, or rales, tachypneic, no increased WOB  Abdomen:   NABS, soft, ND, TTP with guarding suprapubic area  MSK:   Normal tone and bulk, no LEE  Neuro:  Grossly moves all extremities  Data Reviewed: Basic Metabolic Panel:  Recent Labs Lab 06/24/14 1445 06/25/14 0509 06/26/14 0406  NA 130* 132* 138  K 4.8 4.4 4.5  CL 89* 92* 97  CO2 30 29 30   GLUCOSE 84 83 88  BUN 6 7 11   CREATININE 0.79 0.74 0.86  CALCIUM 9.7 9.1 9.2   Liver Function Tests:  Recent Labs Lab  06/24/14 1445 06/26/14 0406  AST 33 23  ALT 10 7  ALKPHOS 59 50  BILITOT 0.9 1.0  PROT 8.2 6.9  ALBUMIN 3.6 3.1*    Recent Labs Lab 06/24/14 1445  LIPASE 81*   No results found for this basename: AMMONIA,  in the last 168 hours CBC:  Recent Labs Lab 06/24/14 1445 06/25/14 0509 06/26/14 0406  WBC 4.9 6.2 5.7  NEUTROABS 2.7  --   --   HGB 10.8* 10.5* 9.2*  HCT 33.5* 32.4* 28.2*  MCV 77.2* 77.7* 77.5*  PLT 151 140* 140*   Cardiac Enzymes:  Recent Labs Lab 06/25/14 0139 06/25/14 0358 06/25/14 1014 06/25/14 1600  TROPONINI 0.33* 0.35* <0.30 <0.30   BNP (last 3 results) No results found for this basename: PROBNP,  in the last 8760 hours CBG:  Recent Labs Lab 06/24/14 1539  GLUCAP 78    Recent Results (from the past 240 hour(s))  MRSA PCR SCREENING     Status: None   Collection Time    06/25/14  6:08 AM      Result Value Ref Range Status   MRSA by PCR NEGATIVE  NEGATIVE Final   Comment:            The GeneXpert MRSA Assay (FDA     approved for NASAL specimens     only), is one component of a     comprehensive MRSA colonization     surveillance program. It is not     intended to diagnose MRSA     infection nor to guide or     monitor treatment for     MRSA infections.     Studies: Dg Chest 2 View  06/24/2014   CLINICAL DATA:  Anthony [name] of breath, cough  EXAM: CHEST  2 VIEW  COMPARISON:  None.  FINDINGS: Normal cardiac silhouette. Lungs are hyperinflated. There is a chronic scarring at the right lung base. No pulmonary edema. No infiltrate. No pneumothorax.  IMPRESSION: 1. Severe left hyperinflated lungs.  2. Chronic scarring at the right lung base.  3. No acute findings.   Electronically Signed   By: Anthony [name] M.D.   On: 06/24/2014 16:55   Ct Angio Chest Pe W/cm &/or Wo Cm  06/25/2014   CLINICAL DATA:  Shortness of breath, chest pain, evaluate for PE  EXAM: CT ANGIOGRAPHY CHEST WITH CONTRAST  TECHNIQUE: Multidetector CT imaging of the chest was  performed using the standard protocol during bolus administration of intravenous contrast. Multiplanar CT image reconstructions and MIPs were obtained to evaluate the vascular anatomy.  CONTRAST:  42mL OMNIPAQUE IOHEXOL 350 MG/ML SOLN  COMPARISON:  Chest radiographs dated 06/24/2014  FINDINGS: No evidence of pulmonary embolism.  Severe emphysematous changes. Patchy opacities in the bilateral lung bases, likely atelectasis, pneumonia not excluded. No pleural effusion or pneumothorax.  The heart is normal in size.  No pericardial  effusion.  No suspicious mediastinal, hilar, or axillary lymphadenopathy.  Visualized upper abdomen is notable for a stable heterogeneous appearance of the right kidney, unchanged from recent CT.  Mild degenerative changes of the visualized thoracolumbar spine. Stable endplate changes of multiple mid thoracic vertebral bodies related to known sickle cell disease.  Review of the MIP images confirms the above findings.  IMPRESSION: No evidence of pulmonary embolism.  Severe emphysematous changes.  Patchy opacities in the bilateral lung bases, likely atelectasis, pneumonia not excluded.   Electronically Signed   By: Julian Hy M.D.   On: 06/25/2014 12:49   Ct Abdomen Pelvis W Contrast  06/24/2014   CLINICAL DATA:  Diarrhea for the past 3 days. Shortness of breath. COPD. History of sickle cell anemia.  EXAM: CT ABDOMEN AND PELVIS WITH CONTRAST  TECHNIQUE: Multidetector CT imaging of the abdomen and pelvis was performed using the standard protocol following bolus administration of intravenous contrast.  CONTRAST:  59mL OMNIPAQUE IOHEXOL 300 MG/ML  SOLN  COMPARISON:  Radiographs obtained earlier today. Previous CT examinations, the most recent dated 03/01/2013.  FINDINGS: Again demonstrated is diffusely heterogeneous enhancement of the kidneys with multiple peripheral and central areas of low density. No discrete mass is seen. There is initial mild diffuse heterogeneity of the liver,  improved on the delayed images through the kidneys. Peripheral splenic calcifications are unchanged. Small calcified granulomata in the liver are again demonstrated.  Dense atheromatous arterial calcifications. Unremarkable bowel gas pattern. There are multiple mildly prominent loops of jejunum without abnormal dilatation. The pancreas, gallbladder adrenal glands and urinary bladder are unremarkable. No prostatic enlargement. Multiple areas of patchy bone sclerosis are again demonstrated. Mild bilateral lower lobe atelectasis. These involve the proximal femurs, pelvis and lumbar spine.  IMPRESSION: 1. No acute abnormality. 2. Stable heterogeneity of the kidneys. 3. Stable areas of bone sclerosis with an appearance most compatible with changes associated with the patient's sickle cell disease. Metastatic prostate cancer is less likely.   Electronically Signed   By: Enrique Sack M.D.   On: 06/24/2014 20:46   Dg Abd 2 Views  06/24/2014   CLINICAL DATA:  Abdominal pain, diarrhea, shortness of breath, history COPD, sickle cell anemia  EXAM: ABDOMEN - 2 VIEW  COMPARISON:  12/29/2012  FINDINGS: Gas throughout nondistended small bowel loops and upper normal caliber colon.  No evidence of bowel obstruction or wall thickening.  No free intraperitoneal air.  Bones appear demineralized with noted bone infarcts in the proximal femora bilaterally.  Lung bases appear emphysematous but clear.  IMPRESSION: Nonobstructive bowel gas pattern.   Electronically Signed   By: Lavonia Dana M.D.   On: 06/24/2014 16:54    Scheduled Meds: . antiseptic oral rinse  7 mL Mouth Rinse BID  . feeding supplement (ENSURE COMPLETE)  237 mL Oral TID BM  . ferrous sulfate  325 mg Oral BID WC  . fluticasone  2 spray Each Nare Daily  . ipratropium-albuterol  3 mL Nebulization QID  . nitroGLYCERIN  0.4 mg Transdermal Daily  . pantoprazole (PROTONIX) IV  40 mg Intravenous Q12H  . sodium chloride  3 mL Intravenous Q12H   Continuous Infusions:    Principal Problem:   Dyspnea Active Problems:   COPD   PULMONARY EMBOLISM, HX OF   Anemia   Abdominal pain   Hyponatremia   Protein-calorie malnutrition, severe   Diarrhea   Chest pain    Time spent: 30 min    Eudell Julian, [facility] Pager [phone]. If  7PM-7AM, please contact night-coverage at [email or web address], password [facility] 06/26/2014, 10:48 AM  LOS: 2 days

## 2014-06-26 NOTE — Progress Notes (Signed)
ANTIBIOTIC CONSULT NOTE - INITIAL  Pharmacy Consult for Levaquin Indication: acute COPD exacerbation  No Known Allergies  Patient Measurements: Height: 5\' 10"  (177.8 cm) Weight: 93 lb 11.1 oz (42.5 kg) IBW/kg (Calculated) : 73  Vital Signs:   Intake/Output from previous day: 08/25 0701 - 08/26 0700 In: 1013.3 [I.V.:1013.3] Out: 775 [Urine:775] Intake/Output from this shift: Total I/O In: 240 [P.O.:240] Out: 200 [Urine:200]  Labs:  Recent Labs  06/24/14 1445 06/25/14 0509 06/26/14 0406  WBC 4.9 6.2 5.7  HGB 10.8* 10.5* 9.2*  PLT 151 140* 140*  CREATININE 0.79 0.74 0.86   Estimated Creatinine Clearance: 43.9 ml/min (by C-G formula based on Cr of 0.86). No results found for this basename: VANCOTROUGH, Corlis Leak, VANCORANDOM, [place], GENTPEAK, GENTRANDOM, TOBRATROUGH, TOBRAPEAK, TOBRARND, AMIKACINPEAK, AMIKACINTROU, AMIKACIN,  in the last 72 hours   Microbiology: Recent Results (from the past 720 hour(s))  MRSA PCR SCREENING     Status: None   Collection Time    06/25/14  6:08 AM      Result Value Ref Range Status   MRSA by PCR NEGATIVE  NEGATIVE Final   Comment:            The GeneXpert MRSA Assay (FDA     approved for NASAL specimens     only), is one component of a     comprehensive MRSA colonization     surveillance program. It is not     intended to diagnose MRSA     infection nor to guide or     monitor treatment for     MRSA infections.    Medical History: Past Medical History  Diagnosis Date  . COPD (chronic obstructive pulmonary disease)   . Sickle cell anemia   . Pulmonary embolism     Assessment: 89 yoM admitted with chest tightness, dyspnea. Pharmacy consulted to dose Levaquin for acute COPD exacerbation.  Tmax: afebrile WBCs: WNL Renal: SCr 0.86 CrCl 89ml/min   Goal of Therapy:  Appropriate antibiotic dosing for renal function; eradication of infection  Plan:  Start Levaquin 750mg  IV Q48H Follow up culture results  Kizzie Furnish, PharmD Pager: [phone] 06/26/2014 11:14 AM

## 2014-06-26 NOTE — Progress Notes (Signed)
     SUBJECTIVE: Pt denies chest pain or SOB.   BP 121/77  Pulse 87  Temp(Src) 97.3 F (36.3 C) (Oral)  Resp 18  Ht 5\' 10"  (1.778 m)  Wt 93 lb 11.1 oz (42.5 kg)  BMI 13.44 kg/m2  SpO2 98%  Intake/Output Summary (Last 24 hours) at 06/26/14 0650 Last data filed at 06/26/14 5053  Gross per 24 hour  Intake 413.33 ml  Output    775 ml  Net -361.67 ml    PHYSICAL EXAM General: Thin, cachectic male. No acute distress. Alert and oriented x 3.  Psych:  Flat affect, responds appropriately Neck: No JVD. No masses noted.  Lungs: Clear bilaterally  Heart: RRR with no murmurs noted. Abdomen: Bowel sounds are present. Soft, non-tender.  Extremities: No lower extremity edema.   LABS: Basic Metabolic Panel:  Recent Labs  06/25/14 0509 06/26/14 0406  NA 132* 138  K 4.4 4.5  CL 92* 97  CO2 29 30  GLUCOSE 83 88  BUN 7 11  CREATININE 0.74 0.86  CALCIUM 9.1 9.2   CBC:  Recent Labs  06/24/14 1445 06/25/14 0509 06/26/14 0406  WBC 4.9 6.2 5.7  NEUTROABS 2.7  --   --   HGB 10.8* 10.5* 9.2*  HCT 33.5* 32.4* 28.2*  MCV 77.2* 77.7* 77.5*  PLT 151 140* 140*   Cardiac Enzymes:  Recent Labs  06/25/14 0358 06/25/14 1014 06/25/14 1600  TROPONINI 0.35* <0.30 <0.30    Current Meds: . antiseptic oral rinse  7 mL Mouth Rinse BID  . feeding supplement (ENSURE COMPLETE)  237 mL Oral TID BM  . ferrous sulfate  325 mg Oral BID WC  . fluticasone  2 spray Each Nare Daily  . ipratropium-albuterol  3 mL Nebulization QID  . nitroGLYCERIN  0.4 mg Transdermal Daily  . pantoprazole (PROTONIX) IV  40 mg Intravenous Q12H  . sodium chloride  3 mL Intravenous Q12H   Echo 06/25/14: Left ventricle: The cavity size was normal. Wall thickness was normal. Systolic function was normal. The estimated ejection fraction was in the range of 55% to 60%. - Atrial septum: No defect or patent foramen ovale was identified.  ASSESSMENT AND PLAN:  1. Abdominal pain: The pain described initially by  patient seemed to be abdominal. No chest pain per patient. See full note yesterday by Dr. Johnsie Cancel. This is not likely cardiac related. First troponin was slightly elevated but next two were normal. Echo with normal LV function and normal wall motion. No further cardiac workup.   2. Dyspnea: CTA negative for PE. No LV dysfunction. Likely due to COPD.   Will sign off. Please call with questions.   Addilee Neu  8/26/20156:50 AM

## 2014-06-26 NOTE — Progress Notes (Signed)
Pt BP 83/56 HR 61 at 1438 with Dinamap. Rechecked manually BP 110/50. Asymptomatic. MD notified. Will continue to monitor.

## 2014-06-26 NOTE — Progress Notes (Signed)
Clinical Social Work Department BRIEF PSYCHOSOCIAL ASSESSMENT 06/26/2014  Patient:  Anthony [name], Anthony [name]     Account Number:  [id number]     Admit date:  06/24/2014  Clinical Social Worker:  Renold Genta  Date/Time:  06/26/2014 04:07 PM  Referred by:  Physician  Date Referred:  06/26/2014 Referred for  SNF Placement   Other Referral:   Interview type:  Patient Other interview type:   and sons, Wille Celeste via phone    PSYCHOSOCIAL DATA Living Status:  ALONE Admitted from facility:   Level of care:   Primary support name:  Ronalee Belts (son) ph#: [phone] Primary support relationship to patient:  CHILD, ADULT Degree of support available:   good    CURRENT CONCERNS Current Concerns  Post-Acute Placement   Other Concerns:    SOCIAL WORK ASSESSMENT / PLAN CSW received consult for SNF placement.   Assessment/plan status:  Information/Referral to [employer] Other assessment/ plan:   Information/referral to community resources:   CSW completed FL2 and faxed information to [place] - provided bed offers to patient.    PATIENT'S/FAMILY'S RESPONSE TO PLAN OF CARE: Patient recently left AMA from [facility] and went home alone, readmitted to the hospital 8/24. Patient states that he's "like to try rehab at [facility]." CSW confirmed with [facility] that they would be able to take patient when stable - anticipating possible discharge tomorrow.       Raynaldo Opitz, [facility] Clinical Social Worker cell #: [phone]

## 2014-06-26 NOTE — Progress Notes (Signed)
Clinical Social Work Department CLINICAL SOCIAL WORK PLACEMENT NOTE 06/26/2014  Patient:  Anthony [name], Anthony [name]  Account Number:  [id number] Admit date:  06/24/2014  Clinical Social Worker:  Renold Genta  Date/time:  06/26/2014 04:13 PM  Clinical Social Work is seeking post-discharge placement for this patient at the following level of care:   SKILLED NURSING   (*CSW will update this form in Epic as items are completed)   06/26/2014  Patient/family provided with [facility] Department of Clinical Social Work's list of facilities offering this level of care within the geographic area requested by the patient (or if unable, by the patient's family).  06/26/2014  Patient/family informed of their freedom to choose among providers that offer the needed level of care, that participate in Medicare, Medicaid or managed care program needed by the patient, have an available bed and are willing to accept the patient.  06/26/2014  Patient/family informed of [facility]' ownership interest in [facility], as well as of the fact that they are under no obligation to receive care at this facility.  PASARR submitted to EDS on 06/26/2014 PASARR number received on 06/26/2014  FL2 transmitted to all facilities in geographic area requested by pt/family on  06/26/2014 FL2 transmitted to all facilities within larger geographic area on   Patient informed that his/her managed care company has contracts with or will negotiate with  certain facilities, including the following:     Patient/family informed of bed offers received:  06/26/2014 Patient chooses bed at [facility] Physician recommends and patient chooses bed at    Patient to be transferred to [facility] on   Patient to be transferred to facility by  Patient and family notified of transfer on  Name of family member notified:    The following physician request were entered in Epic:   Additional  Comments:   Raynaldo Opitz, [place] Social Worker cell #: [phone]

## 2014-06-27 ENCOUNTER — Inpatient Hospital Stay (HOSPITAL_COMMUNITY): Payer: Medicare Other

## 2014-06-27 LAB — COMPREHENSIVE METABOLIC PANEL
ALK PHOS: 47 U/L (ref 39–117)
ALT: 6 U/L (ref 0–53)
AST: 20 U/L (ref 0–37)
Albumin: 3 g/dL — ABNORMAL LOW (ref 3.5–5.2)
Anion gap: 10 (ref 5–15)
BILIRUBIN TOTAL: 0.8 mg/dL (ref 0.3–1.2)
BUN: 12 mg/dL (ref 6–23)
CHLORIDE: 94 meq/L — AB (ref 96–112)
CO2: 30 meq/L (ref 19–32)
Calcium: 9.1 mg/dL (ref 8.4–10.5)
Creatinine, Ser: 0.94 mg/dL (ref 0.50–1.35)
GFR calc Af Amer: >90 mL/min (ref >90)
GFR calc non Af Amer: 79 mL/min — ABNORMAL LOW (ref >90)
Glucose, Bld: 231 mg/dL — ABNORMAL HIGH (ref 70–99)
Potassium: 5.3 mEq/L (ref 3.7–5.3)
Sodium: 134 mEq/L — ABNORMAL LOW (ref 137–147)
Total Protein: 7.1 g/dL (ref 6.0–8.3)

## 2014-06-27 LAB — CBC
HEMATOCRIT: 26.9 % — AB (ref 39.0–52.0)
Hemoglobin: 8.8 g/dL — ABNORMAL LOW (ref 13.0–17.0)
MCH: 25.3 pg — ABNORMAL LOW (ref 26.0–34.0)
MCHC: 32.7 g/dL (ref 30.0–36.0)
MCV: 77.3 fL — AB (ref 78.0–100.0)
PLATELETS: 126 10*3/uL — AB (ref 150–400)
RBC: 3.48 MIL/uL — AB (ref 4.22–5.81)
RDW: 22.7 % — ABNORMAL HIGH (ref 11.5–15.5)
WBC: 3.2 10*3/uL — AB (ref 4.0–10.5)

## 2014-06-27 MED ORDER — SODIUM CHLORIDE 0.9 % IV BOLUS (SEPSIS)
1000.0000 mL | Freq: Once | INTRAVENOUS | Status: AC
  Administered 2014-06-27: 1000 mL via INTRAVENOUS

## 2014-06-27 MED ORDER — PANTOPRAZOLE SODIUM 40 MG PO TBEC
40.0000 mg | DELAYED_RELEASE_TABLET | Freq: Two times a day (BID) | ORAL | Status: DC
  Administered 2014-06-27 – 2014-07-01 (×8): 40 mg via ORAL
  Filled 2014-06-27 (×12): qty 1

## 2014-06-27 MED ORDER — PREDNISONE 50 MG PO TABS
60.0000 mg | ORAL_TABLET | Freq: Every day | ORAL | Status: DC
  Administered 2014-06-28 – 2014-06-29 (×2): 60 mg via ORAL
  Filled 2014-06-27 (×4): qty 1

## 2014-06-27 MED ORDER — MEGESTROL ACETATE 400 MG/10ML PO SUSP
400.0000 mg | Freq: Every day | ORAL | Status: DC
  Administered 2014-06-28 – 2014-07-01 (×4): 400 mg via ORAL
  Filled 2014-06-27 (×4): qty 10

## 2014-06-27 MED ORDER — SALINE SPRAY 0.65 % NA SOLN
1.0000 | NASAL | Status: DC | PRN
  Filled 2014-06-27: qty 44

## 2014-06-27 NOTE — Care Management Note (Addendum)
    Page 1 of 2   07/01/2014     2:47:20 PM CARE MANAGEMENT NOTE 07/01/2014  Patient:  Anthony [name], Anthony [name]   Account Number:  [id number]  Date Initiated:  06/25/2014  Documentation initiated by:  DAVIS,RHONDA  Subjective/Objective Assessment:   pt with hx of p.e. and copd, home x3 days from snf/increased wob and chest tightness.     Action/Plan:   tbd ecg does suggest anterolateral infarct.   Anticipated DC Date:  07/01/2014   Anticipated DC Plan:  SKILLED NURSING FACILITY  In-house referral  NA      DC Planning Services  CM consult      PAC Choice  NA   Choice offered to / List presented to:  NA      DME agency  NA     [place] arranged  NA      [place] agency  NA   Status of service:  Completed, signed off Medicare Important Message given?  NA - LOS <3 / Initial given by admissions (If response is "NO", the following Medicare IM given date fields will be blank) Date Medicare IM given:   Medicare IM given by:   Date Additional Medicare IM given:  07/01/2014 Additional Medicare IM given by:  Dessa Phi  Discharge Disposition:  [place]  Per UR Regulation:  Reviewed for med. necessity/level of care/duration of stay  If discussed at [place] of Stay Meetings, dates discussed:    Comments:  07/01/14 Carliyah Cotterman RN,BSN NCM [phone] D/C SNF W/PCS.  06/28/14 Jillyan Plitt RN,BSN NCM [phone] PMT CONS.COPD,HGB 7.6-1UPRBC,DYSPHAGIA 2 DIET.IV ABX,02,NEBS.PMT CONS.CURRENT D/C PLAN SNF.  06/27/14 Barb Shear RN,BSN NCM [phone] PT-SNF.COPD,MALNUTRITION.ST EVAL.DYSPHAGIA 2 DIET.IV ABX,02,NEBS,MORPHINE IV.D/C PLAN SNF.  Rhonda Davis,RN,BSN,CCM

## 2014-06-27 NOTE — Progress Notes (Addendum)
[facility] PROGRESS NOTE  Anthony [name] HYI:[id number] DOB: 1936/04/09 DOA: 06/24/2014 PCP: Philis Fendt, MD  Assessment/Plan  Chest tightness and dyspnea, peak troponin of 0.35. His EKG demonstrated nonspecific ST segment changes. Telemetry demonstrated normal sinus rhythm. He was seen by cardiology who recommended no further inpatient evaluation at this time and followup as an outpatient. His d-dimer was 0.6, and he underwent CT anterior chest which was negative for pneumonia and for pulmonary embolism.  Most likely secondary to COPD and severe protein calorie malnutrition with cachexia -  Continue to treat for acute COPD exacerbation -  Encouraged him to eat liberally and take his supplements -  If despite these interventions he continues to be Anthony [name] of breath, recommend palliative care consult for symptomatic management and assessment for hospice  Globus sensation with solids -  MBS -  Speech therapy consult -  Possible ENT or GI consult depending on findings -  Change to dysphagia 2 diet for comfort  GI bleed, occult positive stool, hemoglobin trended down to 9.2  -  Repeat H&H this afternoon -  F/u with gastroenterology.   COPD on chronic oxygen with acute exacerbation -  D/c solumedrol and start prednisone tomorrow -  Continue levofloxacin day 2 -  Continue duonebs -  Continue budesonide and brovana for now, but convert to [facility] or symbicort or advair at discharge  History of pulmonary embolism, no longer on anticoagulation secondary to GI bleed. No acute PE on CT anterior chest.   Iron deficiency anemia, started on iron supplementation.   Diarrhea with suprapubic abdominal pain, C. difficile not obtained because the patient no longer having diarrhea. liver function tests and urinalysis within normal limits except for albumin of 3.1. Lipase 81, decreased from prior admissions.  -  PVR wnl -  lactic acid 1.3  Severe protein calorie malnutrition with a 22 pound  weight loss since last year.  -  PSA, CEA and AFP wnl.  Has early satiety and globus sensation -  Regular diet and encouraged to use supplements -  Consider palliative care consultation to follow at SNF -  Code status not readdressed today  Weakness, likely secondary to malnutrition, physical therapy recommending skilled nursing for ongoing PT/OT.   Hyponatremia, chronic likely secondary to recent osmostat and dehydration, and resolved with IVF.   Thrombocytopenia. Repeat CBC in 1 week.   Diet:  regular Access:  PIV IVF:  off Proph:  SCD  Code Status: full Family Communication: patient alone Disposition Plan: pending improvement in dyspnea   Consultants:  cardiology  Procedures:  KUB  CXR  CT abd/pelvis  CT chest   Antibiotics:  Levofloxacin    HPI/Subjective:  Still having chest tightness and SOB, improved somewhat with breathing treatments  Denies nausea, vomiting, diarrhea, constipation since admission, but still having severe lower abdominal pain.    Objective: Filed Vitals:   06/27/14 0743 06/27/14 0856 06/27/14 0915 06/27/14 1011  BP:    130/60  Pulse:   93 100  Temp:      TempSrc:      Resp:    20  Height:  5\' 10"  (1.778 m)    Weight:      SpO2: 100%   100%    Intake/Output Summary (Last 24 hours) at 06/27/14 1204 Last data filed at 06/27/14 0854  Gross per 24 hour  Intake    450 ml  Output    750 ml  Net   -300 ml   [employer]  06/25/14 0544  Weight: 42.5 kg (93 lb 11.1 oz)    Exam:   General:  Cachectic BM, No acute distress  HEENT:  NCAT, MMM  Cardiovascular:  RRR, nl S1, S2 no mrg, 2+ pulses, warm extremities  Respiratory:  Diminished bilateral BS to apices, no wheezes, rhonchi, or rales, tachypneic, no increased WOB  Abdomen:   NABS, soft, ND, TTP with guarding suprapubic area  MSK:   Normal tone and bulk, no LEE  Neuro:  Grossly moves all extremities  Data Reviewed: Basic Metabolic Panel:  Recent Labs Lab  06/24/14 1445 06/25/14 0509 06/26/14 0406 06/27/14 0430  NA 130* 132* 138 134*  K 4.8 4.4 4.5 5.3  CL 89* 92* 97 94*  CO2 30 29 30 30   GLUCOSE 84 83 88 231*  BUN 6 7 11 12   CREATININE 0.79 0.74 0.86 0.94  CALCIUM 9.7 9.1 9.2 9.1   Liver Function Tests:  Recent Labs Lab 06/24/14 1445 06/26/14 0406 06/27/14 0430  AST 33 23 20  ALT 10 7 6   ALKPHOS 59 50 47  BILITOT 0.9 1.0 0.8  PROT 8.2 6.9 7.1  ALBUMIN 3.6 3.1* 3.0*    Recent Labs Lab 06/24/14 1445  LIPASE 81*   No results found for this basename: AMMONIA,  in the last 168 hours CBC:  Recent Labs Lab 06/24/14 1445 06/25/14 0509 06/26/14 0406 06/26/14 1059 06/27/14 0430  WBC 4.9 6.2 5.7  --  3.2*  NEUTROABS 2.7  --   --   --   --   HGB 10.8* 10.5* 9.2* 9.2* 8.8*  HCT 33.5* 32.4* 28.2* 28.0* 26.9*  MCV 77.2* 77.7* 77.5*  --  77.3*  PLT 151 140* 140*  --  126*   Cardiac Enzymes:  Recent Labs Lab 06/25/14 0139 06/25/14 0358 06/25/14 1014 06/25/14 1600  TROPONINI 0.33* 0.35* <0.30 <0.30   BNP (last 3 results) No results found for this basename: PROBNP,  in the last 8760 hours CBG:  Recent Labs Lab 06/24/14 1539  GLUCAP 78    Recent Results (from the past 240 hour(s))  MRSA PCR SCREENING     Status: None   Collection Time    06/25/14  6:08 AM      Result Value Ref Range Status   MRSA by PCR NEGATIVE  NEGATIVE Final   Comment:            The GeneXpert MRSA Assay (FDA     approved for NASAL specimens     only), is one component of a     comprehensive MRSA colonization     surveillance program. It is not     intended to diagnose MRSA     infection nor to guide or     monitor treatment for     MRSA infections.     Studies: Ct Angio Chest Pe W/cm &/or Wo Cm  06/25/2014   CLINICAL DATA:  Shortness of breath, chest pain, evaluate for PE  EXAM: CT ANGIOGRAPHY CHEST WITH CONTRAST  TECHNIQUE: Multidetector CT imaging of the chest was performed using the standard protocol during bolus  administration of intravenous contrast. Multiplanar CT image reconstructions and MIPs were obtained to evaluate the vascular anatomy.  CONTRAST:  47mL OMNIPAQUE IOHEXOL 350 MG/ML SOLN  COMPARISON:  Chest radiographs dated 06/24/2014  FINDINGS: No evidence of pulmonary embolism.  Severe emphysematous changes. Patchy opacities in the bilateral lung bases, likely atelectasis, pneumonia not excluded. No pleural effusion or pneumothorax.  The heart is normal in size.  No pericardial effusion.  No suspicious mediastinal, hilar, or axillary lymphadenopathy.  Visualized upper abdomen is notable for a stable heterogeneous appearance of the right kidney, unchanged from recent CT.  Mild degenerative changes of the visualized thoracolumbar spine. Stable endplate changes of multiple mid thoracic vertebral bodies related to known sickle cell disease.  Review of the MIP images confirms the above findings.  IMPRESSION: No evidence of pulmonary embolism.  Severe emphysematous changes.  Patchy opacities in the bilateral lung bases, likely atelectasis, pneumonia not excluded.   Electronically Signed   By: Julian Hy M.D.   On: 06/25/2014 12:49    Scheduled Meds: . antiseptic oral rinse  7 mL Mouth Rinse BID  . arformoterol  15 mcg Nebulization BID  . budesonide (PULMICORT) nebulizer solution  0.25 mg Nebulization BID  . feeding supplement (ENSURE COMPLETE)  237 mL Oral TID BM  . ferrous sulfate  325 mg Oral BID WC  . fluticasone  2 spray Each Nare Daily  . ipratropium-albuterol  3 mL Nebulization QID  . levofloxacin (LEVAQUIN) IV  750 mg Intravenous Q48H  . [START ON 06/28/2014] megestrol  400 mg Oral Daily  . nitroGLYCERIN  0.4 mg Transdermal Daily  . pantoprazole  40 mg Oral BID AC  . [START ON 06/28/2014] predniSONE  60 mg Oral Q breakfast  . sodium chloride  3 mL Intravenous Q12H   Continuous Infusions:   Principal Problem:   Dyspnea Active Problems:   COPD   PULMONARY EMBOLISM, HX OF   Anemia    Abdominal pain   Hyponatremia   Protein-calorie malnutrition, severe   Diarrhea   Chest pain    Time spent: 30 min    Sienna Stonehocker, [place] [facility] Pager [phone]. If 7PM-7AM, please contact night-coverage at [email or web address], password [facility] 06/27/2014, 12:04 PM  LOS: 3 days

## 2014-06-27 NOTE — Progress Notes (Signed)
NUTRITION FOLLOW UP  Intervention:   -Recommend Ensure Complete TID -Recommend diet textures per SLP- Dys2 -Recommend Megace 400 mg BID (appetite stimulant to start 8/28) -Encourage PO intake -Will continue to monitor  Nutrition Dx:   Inadequate oral intake related to inability to eat as evidenced by NPO; ongoing, now related to decreased appetite/SOB as evidenced by PO intake < 50%  Goal:   Pt to meet >/= 90% of their estimated nutrition needs    Monitor:   Total protein/energy intake, labs, weights, swallow profile  Assessment:   8/25: - Pt known to RD from past admissions  - Pt resting in bed during RD visit, visibly cachetic  - Said he eats pancakes/cornflakes with orange juice for breakfast, eggs and sausage with sweet tea for lunch, and forgets what he usually eats for dinner  - Reports drinking 2 Ensure/day at home  - Knows he's been dropping weight, down 13 pounds in the past month  - Pt falling asleep at end of visit   8/27: -Was placed on Regular diet on 8/26; SLP recommended pt be on Dys2 diet with pureed meats and extra gravies/sauces for comfort. MD noted pt with globus sensation with solids -Current PO intake approximately 30% -Ensure Complete TID was ordered on 8/27 -Megace 400 mg BID to begin on 8/28 -Loose stools improving during admit -Pallitiave care consult pending for COPD symptom management  Height: Ht Readings from Last 1 Encounters:  06/27/14 5\' 10"  (1.778 m)    Weight Status:   Wt Readings from Last 1 Encounters:  06/25/14 93 lb 11.1 oz (42.5 kg)    Re-estimated needs:  Kcal: 1700-1900  Protein: 85-105g  Fluid: 1.7-1.9L/day    Skin: WDL  Diet Order: Dysphagia 2/ pureed meats   Intake/Output Summary (Last 24 hours) at 06/27/14 1448 Last data filed at 06/27/14 1300  Gross per 24 hour  Intake    450 ml  Output    750 ml  Net   -300 ml    Last BM: 8/24  Labs:   Recent Labs Lab 06/25/14 0509 06/26/14 0406 06/27/14 0430  NA  132* 138 134*  K 4.4 4.5 5.3  CL 92* 97 94*  CO2 29 30 30   BUN 7 11 12   CREATININE 0.74 0.86 0.94  CALCIUM 9.1 9.2 9.1  GLUCOSE 83 88 231*    CBG (last 3)   Recent Labs  06/24/14 1539  GLUCAP 78    Scheduled Meds: . antiseptic oral rinse  7 mL Mouth Rinse BID  . arformoterol  15 mcg Nebulization BID  . budesonide (PULMICORT) nebulizer solution  0.25 mg Nebulization BID  . feeding supplement (ENSURE COMPLETE)  237 mL Oral TID BM  . ferrous sulfate  325 mg Oral BID WC  . fluticasone  2 spray Each Nare Daily  . ipratropium-albuterol  3 mL Nebulization QID  . levofloxacin (LEVAQUIN) IV  750 mg Intravenous Q48H  . [START ON 06/28/2014] megestrol  400 mg Oral Daily  . pantoprazole  40 mg Oral BID AC  . [START ON 06/28/2014] predniSONE  60 mg Oral Q breakfast  . sodium chloride  3 mL Intravenous Q12H    Continuous Infusions:   Atlee Abide MS RD LDN Clinical Dietitian [phone]

## 2014-06-27 NOTE — Procedures (Signed)
Objective Swallowing Evaluation: Modified Barium Swallowing Study  Patient Details  Name: Anthony [name] MRN: [id number] Date of Birth: [birth year]  Today's Date: 06/27/2014 Time: 4098-1191 SLP Time Calculation (min): 32 min  Past Medical History:  Past Medical History  Diagnosis Date  . COPD (chronic obstructive pulmonary disease)   . Sickle cell anemia   . Pulmonary embolism    Past Surgical History:  Past Surgical History  Procedure Laterality Date  . No past surgeries    . Colonoscopy N/A 03/09/2013    Procedure: COLONOSCOPY;  Surgeon: Beryle Beams, MD;  Location: [facility];  Service: Endoscopy;  Laterality: N/A;  . Esophagogastroduodenoscopy N/A 03/09/2013    Procedure: ESOPHAGOGASTRODUODENOSCOPY (EGD);  Surgeon: Beryle Beams, MD;  Location: [facility] ENDOSCOPY;  Service: Endoscopy;  Laterality: N/A;   HPI:  78 yo male adm to [facility] with chest tightness, dyspnea.   Pt with PMH + for COPD, sickle cell anemia, and pulmonary embolism.  He has previously been seen by SLP for BSE.  He continues to report dysphagia to solid foods sensing stasis in phayrnx.  Pt also reported early satiety during last admit.  H/o gastritis but normal esophagus per endoscopy March 2014.  MD ordered swallow evaluation and after discussing - clinical eval was cancelled to proceed to instrumental evaluation.       Assessment / Plan / Recommendation Clinical Impression  Dysphagia Diagnosis: Mild pharyngeal phase dysphagia;Mild oral phase dysphagia  Clinical impression: Pt presents with mild oropharyngeal dysphagia with weakness.   Decreased oral bolus containment noted with pt extending head upward to aid oral transiting.  Tongue base retraction was compromised and resulted in stasis of solids/pudding- as pt identifies.  Liquid swallows aid clearance of pharynx.    Suspect pt's dysphagia is due to his poor dentition, xerostomia, deconditioning and dyspnea impacting swallow/breathing coordination.  Also suspect  his COPD (calories using to breathe), kyphosis and pt's report (during early August 2015 admit of early satiety) impacts his nutrition level.      Advised pt to compensation strategies to increase efficiency and decrease effort when eating.  Using teach back, live video, SLP provided pt with instructions to precautions/compensation.      Recommend dietician consult to help maximize intake.      Treatment Recommendation  Therapy as outlined in treatment plan below    Diet Recommendation Dysphagia 2 (Fine chop);Thin liquid - extra gravy/sauce    Liquid Administration via: Straw;Cup Medication Administration: Whole meds with liquid Supervision: Patient able to self feed Compensations: Slow rate;Small sips/bites (start meal with liquids, follow solids with liquids, intermittent dry swallow) Postural Changes and/or Swallow Maneuvers: Seated upright 90 degrees;Upright 30-60 min after meal    Other  Recommendations Recommended Consults: Other (Comment) (dietician to help pt maximize intake) Oral Care Recommendations: Oral care BID   Follow Up Recommendations  SLP at next venue of care briefly for dysphagia management    Frequency and Duration min 1 x/week  1 week     General Date of Onset: 06/07/14 HPI: 78 yo male adm to [facility] with chest tightness, dyspnea.   Pt with PMH + for COPD, sickle cell anemia, and pulmonary embolism.  He has previously been seen by SLP for BSE.  He continues to report dysphagia to solid foods sensing stasis in phayrnx.  Pt also reported early satiety during last admit.  H/o gastritis but normal esophagus per endoscopy March 2014.  MD ordered swallow evaluation and after discussing - clinical eval was cancelled to proceed  to instrumental evaluation.   Type of Study: Modified Barium Swallowing Study Reason for Referral: Objectively evaluate swallowing function Previous Swallow Assessment: bse 06/07/2014 Diet Prior to this Study: Dysphagia 2 (chopped);Thin  liquids Respiratory Status: Nasal cannula History of Recent Intubation: No Behavior/Cognition: Alert;Cooperative;Pleasant mood Oral Cavity - Dentition: Poor condition (poor condition) Oral Motor / Sensory Function: Within functional limits Self-Feeding Abilities: Able to feed self Patient Positioning: Postural control interferes with function (pt leans forward and appears kyphotic and this may impair clearance of food/drink) Baseline Vocal Quality: Clear Volitional Cough: Strong Volitional Swallow: Able to elicit Anatomy: Within functional limits Pharyngeal Secretions: Standing secretions in (comment) (mild secretions appeared in pharynx mixed with barium)    Reason for Referral Objectively evaluate swallowing function   Oral Phase Oral Preparation/Oral Phase Oral Phase: Impaired Oral - Thin Oral - Thin Teaspoon: Reduced posterior propulsion;Weak lingual manipulation;Piecemeal swallowing;Delayed oral transit Oral - Thin Cup: Weak lingual manipulation;Reduced posterior propulsion;Piecemeal swallowing;Delayed oral transit Oral - Thin Straw: Weak lingual manipulation;Reduced posterior propulsion;Piecemeal swallowing;Delayed oral transit Oral - Solids Oral - Puree: Delayed oral transit;Reduced posterior propulsion Oral - Regular: Reduced posterior propulsion;Weak lingual manipulation;Delayed oral transit Oral Phase - Comment Oral Phase - Comment: pt flexes head to aid oral transiting   Pharyngeal Phase Pharyngeal Phase Pharyngeal Phase: Impaired Pharyngeal - Thin Pharyngeal - Thin Teaspoon: Reduced tongue base retraction;Pharyngeal residue - valleculae;Pharyngeal residue - pyriform sinuses;Premature spillage to valleculae Pharyngeal - Thin Cup: Reduced tongue base retraction;Penetration/Aspiration during swallow;Pharyngeal residue - pyriform sinuses;Pharyngeal residue - valleculae;Premature spillage to pyriform sinuses Penetration/Aspiration details (thin cup): Material enters airway,  remains ABOVE vocal cords then ejected out Pharyngeal - Thin Straw: Reduced tongue base retraction;Penetration/Aspiration during swallow;Pharyngeal residue - valleculae;Pharyngeal residue - pyriform sinuses;Premature spillage to valleculae Penetration/Aspiration details (thin straw): Material enters airway, remains ABOVE vocal cords then ejected out Pharyngeal - Solids Pharyngeal - Puree: Reduced tongue base retraction;Pharyngeal residue - valleculae;Premature spillage to valleculae Pharyngeal - Regular: Reduced tongue base retraction;Pharyngeal residue - valleculae;Premature spillage to valleculae Pharyngeal Phase - Comment Pharyngeal Comment: moderate residuals noted - worse with solids than liquids, liquid swallows helpful to clear, dry swallows also helpful- most notably with liquid clearance, chin tuck posture did not prevent vallecular space residuals   Cervical Esophageal Phase    GO    Cervical Esophageal Phase Cervical Esophageal Phase:  (appearance of prominent CP did not impair barium flow, pt appeared with mild amount of stasis in distal esophagus  - ? small area corkscrew appearance mid area, could not see to diaphgram and radiologist not present to confirm findings)         Luanna Salk, [place] [facility] [phone]

## 2014-06-27 NOTE — Progress Notes (Signed)
Physical Therapy Treatment Patient Details Name: ZAMIRE WHITEHURST MRN: [id number] DOB: 1936-02-09 Today's Date: 06/27/2014    History of Present Illness 78 yo male admitted with dyspnea. Hx of sickle cell disease, COPD-O2 dep, PE. Pt is from home alone with personal care attendant 5x/week for 3 hours.     PT Comments    Pt in bed on 3 lts O2 states he has been on Oxygen for years but most recently was needing 3 lts vs 2.  Assisted to EOB required increased time and long rest break before attempting amb.  Used B platform EVA walker for increased support and to attempt increase gait distance.  Pt required + 2 assist for safety and to have recliner to follow.  Pt was only able to amb 5 feet.  Sats 92% with HR 112 and 4/4 DOE.  Very limited activity tolerance.    Follow Up Recommendations  SNF ([place])     Equipment Recommendations       Recommendations for Other Services       Precautions / Restrictions Restrictions Weight Bearing Restrictions: No    Mobility  Bed Mobility Overal bed mobility: Needs Assistance Bed Mobility: Supine to Sit     Supine to sit: Min assist     General bed mobility comments: Assist for trunk and bil LEs. Increased time. Remained propped on R elbow for a short while at EOB before being able to achieve full upright posture. Required long sitting rest break.  Transfers Overall transfer level: Needs assistance Equipment used: Bilateral platform walker Ethelene Hal) Transfers: Sit to/from Stand Sit to Stand: Min assist         General transfer comment: Assist to rise, stabilize, control descent. VCs safety, technique, hand placement.   Ambulation/Gait Ambulation/Gait assistance: +2 safety/equipment;Min assist Ambulation Distance (Feet): 5 Feet Assistive device: Bilateral platform walker (Eva walker for increased support and energy conservation) Gait Pattern/deviations: Step-to pattern;Decreased step length - right;Decreased step  length - left;Trunk flexed Gait velocity: decreased   General Gait Details: amb using B platform EVA walker for increased support and to increase gait distance.  Amb on 3 lts O2 sats 92% with HR 112.  Very limited activity tolerance with 4/4 DOE.  + 2 assist for safety and to have recliner follow.     Stairs            Wheelchair Mobility    Modified Rankin (Stroke Patients Only)       Balance                                    Cognition                            Exercises      General Comments        Pertinent Vitals/Pain      Home Living                      Prior Function            PT Goals (current goals can now be found in the care plan section) Progress towards PT goals: Progressing toward goals    Frequency       PT Plan      Co-evaluation             End of Session  Equipment Utilized During Treatment: Gait belt;Oxygen Activity Tolerance: Patient limited by fatigue;Other (comment) (4/4 dyspnea) Patient left: in chair;with call bell/phone within reach     Time: 0930-0955 PT Time Calculation (min): 25 min  Charges:  $Gait Training: 8-22 mins $Therapeutic Activity: 8-22 mins                    G Codes:      Rica Koyanagi  PTA [facility]  Acute  Rehab Pager      [phone]

## 2014-06-28 DIAGNOSIS — R109 Unspecified abdominal pain: Secondary | ICD-10-CM

## 2014-06-28 LAB — CBC
HCT: 22.6 % — ABNORMAL LOW (ref 39.0–52.0)
Hemoglobin: 7.6 g/dL — ABNORMAL LOW (ref 13.0–17.0)
MCH: 25.6 pg — ABNORMAL LOW (ref 26.0–34.0)
MCHC: 33.6 g/dL (ref 30.0–36.0)
MCV: 76.1 fL — AB (ref 78.0–100.0)
Platelets: 97 10*3/uL — ABNORMAL LOW (ref 150–400)
RBC: 2.97 MIL/uL — ABNORMAL LOW (ref 4.22–5.81)
RDW: 22.2 % — AB (ref 11.5–15.5)
WBC: 4.6 10*3/uL (ref 4.0–10.5)

## 2014-06-28 LAB — PREPARE RBC (CROSSMATCH)

## 2014-06-28 LAB — RENAL FUNCTION PANEL
ALBUMIN: 3 g/dL — AB (ref 3.5–5.2)
Anion gap: 7 (ref 5–15)
BUN: 18 mg/dL (ref 6–23)
CALCIUM: 9.3 mg/dL (ref 8.4–10.5)
CO2: 34 mEq/L — ABNORMAL HIGH (ref 19–32)
CREATININE: 0.94 mg/dL (ref 0.50–1.35)
Chloride: 95 mEq/L — ABNORMAL LOW (ref 96–112)
GFR calc Af Amer: >90 mL/min (ref >90)
GFR, EST NON AFRICAN AMERICAN: 79 mL/min — AB (ref >90)
Glucose, Bld: 87 mg/dL (ref 70–99)
PHOSPHORUS: 2.4 mg/dL (ref 2.3–4.6)
Potassium: 4.5 mEq/L (ref 3.7–5.3)
Sodium: 136 mEq/L — ABNORMAL LOW (ref 137–147)

## 2014-06-28 LAB — MAGNESIUM: Magnesium: 1.5 mg/dL (ref 1.5–2.5)

## 2014-06-28 LAB — GLUCOSE, CAPILLARY
GLUCOSE-CAPILLARY: 159 mg/dL — AB (ref 70–99)
Glucose-Capillary: 119 mg/dL — ABNORMAL HIGH (ref 70–99)

## 2014-06-28 MED ORDER — DOCUSATE SODIUM 100 MG PO CAPS
100.0000 mg | ORAL_CAPSULE | Freq: Two times a day (BID) | ORAL | Status: DC
  Administered 2014-06-28 – 2014-07-01 (×5): 100 mg via ORAL
  Filled 2014-06-28 (×7): qty 1

## 2014-06-28 MED ORDER — MORPHINE SULFATE 15 MG PO TABS
15.0000 mg | ORAL_TABLET | ORAL | Status: DC | PRN
  Administered 2014-06-28 – 2014-06-30 (×4): 15 mg via ORAL
  Filled 2014-06-28 (×4): qty 1

## 2014-06-28 MED ORDER — SODIUM CHLORIDE 0.9 % IV SOLN
Freq: Once | INTRAVENOUS | Status: AC
  Administered 2014-06-28: 13:00:00 via INTRAVENOUS

## 2014-06-28 MED ORDER — MAGNESIUM SULFATE 40 MG/ML IJ SOLN
2.0000 g | Freq: Once | INTRAMUSCULAR | Status: AC
  Administered 2014-06-28: 2 g via INTRAVENOUS
  Filled 2014-06-28: qty 50

## 2014-06-28 MED ORDER — POLYETHYLENE GLYCOL 3350 17 G PO PACK
17.0000 g | PACK | Freq: Every day | ORAL | Status: DC
  Administered 2014-06-28 – 2014-07-01 (×3): 17 g via ORAL
  Filled 2014-06-28 (×4): qty 1

## 2014-06-28 MED ORDER — SENNA 8.6 MG PO TABS
2.0000 | ORAL_TABLET | Freq: Every day | ORAL | Status: DC
  Administered 2014-06-28 – 2014-06-30 (×3): 17.2 mg via ORAL
  Filled 2014-06-28 (×3): qty 2

## 2014-06-28 MED ORDER — LEVOFLOXACIN 750 MG PO TABS
750.0000 mg | ORAL_TABLET | ORAL | Status: DC
  Administered 2014-06-28 – 2014-06-30 (×2): 750 mg via ORAL
  Filled 2014-06-28 (×2): qty 1

## 2014-06-28 NOTE — Progress Notes (Signed)
[facility] PROGRESS NOTE  Anthony [name] TMA:[id number] DOB: 08/09/1936 DOA: 06/24/2014 PCP: Philis Fendt, MD  Assessment/Plan  Severe protein calorie malnutrition with a 22 pound weight loss and cachectic, unable to eat. Since we have been unable to identify an etiology for his weight loss, I am concerned that he will continue to lose weight.  His prognosis is guarded.  He declined palliative care consultation, however, he was amenable to discussion code status with me today.  He is now DNR/DNI.  The conversation was not witnessed by RN or family, but I did relay what we discussed to his nurse Susie afterwards.   -  PSA, CEA and AFP wnl -  Continue megace, started 8/27 -  Regular diet and encouraged to use supplements -  Appreciate nutrition recommendations -  Will do calorie count starting tomorrow -  Change code to DNR/DNI, although patient would be amenable to trial of bipap  COPD on chronic oxygen with acute exacerbation -  continue prednisone  -  Continue levofloxacin day 3 -  Continue duonebs -  Continue budesonide and brovana, but convert to [facility] or symbicort or advair at discharge  Chest tightness and dyspnea, peak troponin of 0.35, likely demand ischemia.  CT angio chest negative for pneumonia and for pulmonary embolism.  -  Continue to treat for acute COPD exacerbation -  Encouraged him to eat liberally and take his supplements -  Start morphine prn  Globus sensation with solids likely due to dyspnea, deconditioning -  MBS completed 8/27 -  Speech therapy recommends dysphagia 2 and patient doing better today  GI bleed, occult positive stool, hemoglobin continuing to trend down, had diverticulosis and hemorrhoids on colonoscopy last year by Dr. Benson Norway for same indication.  Given breathing problems and frailty, I do not think that he is currently a good candidate for repeat endoscopy.   -  Transfuse 1 unit PRBC -  Repeat hgb in AM  Diarrhea with suprapubic  abdominal pain, C. difficile not obtained because the patient no longer having diarrhea. liver function tests and urinalysis within normal limits except for albumin of 3.1. Lipase 81, decreased from prior.  admissions.  -  PVR wnl -  lactic acid 1.3 -  Start colace, senna, docusate  History of pulmonary embolism, no longer on anticoagulation secondary to GI bleed. No acute PE on CT anterior chest.   Weakness, likely secondary to malnutrition, physical therapy recommending skilled nursing for ongoing PT/OT.   Hyponatremia, chronic likely secondary to recent osmostat and dehydration, and improved with IVF.   Leukopenia, mild and resolved spontaneously -  Check HIV   Iron deficiency anemia, started on iron supplementation.  -  Iron studies, B12, folate all wnl a few days ago -  Check retic and TSH with AM -  Transfusion as above  Thrombocytopenia, progressive.  May be due to further bleeding.  -  Repeat in AM  Diet:  regular Access:  PIV IVF:  off Proph:  SCD  Code Status: full Family Communication: patient alone Disposition Plan: pending improvement in dyspnea   Consultants:  cardiology  Procedures:  KUB  CXR  CT abd/pelvis  CT chest   Antibiotics:  Levofloxacin    HPI/Subjective:  Some improvement in breathing and eating is going a little better today.   Denies nausea, vomiting, diarrhea.  No BM since admission 5 days ago.   Still having severe lower abdominal pain.    Objective: Filed Vitals:   06/28/14 1210 06/28/14 1310 06/28/14  1410 06/28/14 1447  BP: 115/49 123/49 133/47 128/62  Pulse: 93 93 92 95  Temp: 98.1 F (36.7 C) 98.3 F (36.8 C) 98.5 F (36.9 C) 98.1 F (36.7 C)  TempSrc: Oral Oral Oral Oral  Resp: 18 16 16 20   Height:      Weight:      SpO2: 100% 100% 100% 100%    Intake/Output Summary (Last 24 hours) at 06/28/14 1507 Last data filed at 06/28/14 1447  Gross per 24 hour  Intake 676.81 ml  Output   1075 ml  Net -398.19 ml    Filed Weights   06/25/14 0544  Weight: 42.5 kg (93 lb 11.1 oz)    Exam:   General:  Cachectic BM, No acute distress, lying on right side curled in bed  HEENT:  NCAT, MMM  Cardiovascular:  RRR, nl S1, S2 no mrg, 2+ pulses, warm extremities  Respiratory:  Diminished bilateral BS to apices, no wheezes, rhonchi, or rales, tachypneic, no increased WOB  Abdomen:   NABS, soft, ND, TTP with guarding suprapubic area  MSK:   Normal tone and bulk, no LEE  Neuro:  Grossly moves all extremities  Data Reviewed: Basic Metabolic Panel:  Recent Labs Lab 06/24/14 1445 06/25/14 0509 06/26/14 0406 06/27/14 0430 06/28/14 0445  NA 130* 132* 138 134* 136*  K 4.8 4.4 4.5 5.3 4.5  CL 89* 92* 97 94* 95*  CO2 30 29 30 30  34*  GLUCOSE 84 83 88 231* 87  BUN 6 7 11 12 18   CREATININE 0.79 0.74 0.86 0.94 0.94  CALCIUM 9.7 9.1 9.2 9.1 9.3  MG  --   --   --   --  1.5  PHOS  --   --   --   --  2.4   Liver Function Tests:  Recent Labs Lab 06/24/14 1445 06/26/14 0406 06/27/14 0430 06/28/14 0445  AST 33 23 20  --   ALT 10 7 6   --   ALKPHOS 59 50 47  --   BILITOT 0.9 1.0 0.8  --   PROT 8.2 6.9 7.1  --   ALBUMIN 3.6 3.1* 3.0* 3.0*    Recent Labs Lab 06/24/14 1445  LIPASE 81*   No results found for this basename: AMMONIA,  in the last 168 hours CBC:  Recent Labs Lab 06/24/14 1445 06/25/14 0509 06/26/14 0406 06/26/14 1059 06/27/14 0430 06/28/14 0445  WBC 4.9 6.2 5.7  --  3.2* 4.6  NEUTROABS 2.7  --   --   --   --   --   HGB 10.8* 10.5* 9.2* 9.2* 8.8* 7.6*  HCT 33.5* 32.4* 28.2* 28.0* 26.9* 22.6*  MCV 77.2* 77.7* 77.5*  --  77.3* 76.1*  PLT 151 140* 140*  --  126* 97*   Cardiac Enzymes:  Recent Labs Lab 06/25/14 0139 06/25/14 0358 06/25/14 1014 06/25/14 1600  TROPONINI 0.33* 0.35* <0.30 <0.30   BNP (last 3 results) No results found for this basename: PROBNP,  in the last 8760 hours CBG:  Recent Labs Lab 06/24/14 1539  GLUCAP 78    Recent Results (from  the past 240 hour(s))  MRSA PCR SCREENING     Status: None   Collection Time    06/25/14  6:08 AM      Result Value Ref Range Status   MRSA by PCR NEGATIVE  NEGATIVE Final   Comment:            The GeneXpert MRSA Assay (FDA  approved for NASAL specimens     only), is one component of a     comprehensive MRSA colonization     surveillance program. It is not     intended to diagnose MRSA     infection nor to guide or     monitor treatment for     MRSA infections.     Studies: Dg Swallowing Func-speech Pathology  06/27/2014   Macario Golds, CCC-SLP     06/27/2014  3:22 PM Objective Swallowing Evaluation: Modified Barium Swallowing Study   Patient Details  Name: KRUZ CHIU MRN: [id number] Date of Birth: [birth year]  Today's Date: 06/27/2014 Time: 3536-1443 SLP Time Calculation (min): 32 min  Past Medical History:  Past Medical History  Diagnosis Date  . COPD (chronic obstructive pulmonary disease)   . Sickle cell anemia   . Pulmonary embolism    Past Surgical History:  Past Surgical History  Procedure Laterality Date  . No past surgeries    . Colonoscopy N/A 03/09/2013    Procedure: COLONOSCOPY;  Surgeon: Beryle Beams, MD;   Location: [place];  Service: Endoscopy;  Laterality: N/A;  . Esophagogastroduodenoscopy N/A 03/09/2013    Procedure: ESOPHAGOGASTRODUODENOSCOPY (EGD);  Surgeon: Beryle Beams, MD;  Location: [facility] END[facility]COPY;  Service: Endoscopy;   Laterality: N/A;   HPI:  78 yo male adm to [facility] with chest tightness, dyspnea.   Pt with  PMH + for COPD, sickle cell anemia, and pulmonary embolism.  He  has previously been seen by SLP for BSE.  He continues to report  dysphagia to solid foods sensing stasis in phayrnx.  Pt also  reported early satiety during last admit.  H/o gastritis but  normal esophagus per endoscopy March 2014.  MD ordered swallow  evaluation and after discussing - clinical eval was cancelled to  proceed to instrumental evaluation.       Assessment / Plan /  Recommendation Clinical Impression  Dysphagia Diagnosis: Mild pharyngeal phase dysphagia;Mild oral  phase dysphagia  Clinical impression: Pt presents with mild oropharyngeal  dysphagia with weakness.   Decreased oral bolus containment noted  with pt extending head upward to aid oral transiting.  Tongue  base retraction was compromised and resulted in stasis of  solids/pudding- as pt identifies.  Liquid swallows aid clearance  of pharynx.    Suspect pt's dysphagia is due to his poor dentition, xerostomia,  deconditioning and dyspnea impacting swallow/breathing  coordination.  Also suspect his COPD (calories using to breathe),  kyphosis and pt's report (during early August 2015 admit of early  satiety) impacts his nutrition level.      Advised pt to compensation strategies to increase efficiency and  decrease effort when eating.  Using teach back, live video, SLP  provided pt with instructions to precautions/compensation.      Recommend dietician consult to help maximize intake.      Treatment Recommendation  Therapy as outlined in treatment plan below    Diet Recommendation Dysphagia 2 (Fine chop);Thin liquid - extra  gravy/sauce    Liquid Administration via: Straw;Cup Medication Administration: Whole meds with liquid Supervision: Patient able to self feed Compensations: Slow rate;Small sips/bites (start meal with  liquids, follow solids with liquids, intermittent dry swallow) Postural Changes and/or Swallow Maneuvers: Seated upright 90  degrees;Upright 30-60 min after meal    Other  Recommendations Recommended Consults: Other (Comment)  (dietician to help pt maximize intake) Oral Care Recommendations: Oral care BID   Follow Up Recommendations  SLP  at next venue of care briefly for dysphagia management    Frequency and Duration min 1 x/week  1 week     General Date of Onset: 06/07/14 HPI: 78 yo male adm to [facility] with chest tightness, dyspnea.   Pt  with PMH + for COPD, sickle cell anemia, and pulmonary embolism.   He  has previously been seen by SLP for BSE.  He continues to  report dysphagia to solid foods sensing stasis in phayrnx.  Pt  also reported early satiety during last admit.  H/o gastritis but  normal esophagus per endoscopy March 2014.  MD ordered swallow  evaluation and after discussing - clinical eval was cancelled to  proceed to instrumental evaluation.   Type of Study: Modified Barium Swallowing Study Reason for Referral: Objectively evaluate swallowing function Previous Swallow Assessment: bse 06/07/2014 Diet Prior to this Study: Dysphagia 2 (chopped);Thin liquids Respiratory Status: Nasal cannula History of Recent Intubation: No Behavior/Cognition: Alert;Cooperative;Pleasant mood Oral Cavity - Dentition: Poor condition (poor condition) Oral Motor / Sensory Function: Within functional limits Self-Feeding Abilities: Able to feed self Patient Positioning: Postural control interferes with function  (pt leans forward and appears kyphotic and this may impair  clearance of food/drink) Baseline Vocal Quality: Clear Volitional Cough: Strong Volitional Swallow: Able to elicit Anatomy: Within functional limits Pharyngeal Secretions: Standing secretions in (comment) (mild  secretions appeared in pharynx mixed with barium)    Reason for Referral Objectively evaluate swallowing function   Oral Phase Oral Preparation/Oral Phase Oral Phase: Impaired Oral - Thin Oral - Thin Teaspoon: Reduced posterior propulsion;Weak lingual  manipulation;Piecemeal swallowing;Delayed oral transit Oral - Thin Cup: Weak lingual manipulation;Reduced posterior  propulsion;Piecemeal swallowing;Delayed oral transit Oral - Thin Straw: Weak lingual manipulation;Reduced posterior  propulsion;Piecemeal swallowing;Delayed oral transit Oral - Solids Oral - Puree: Delayed oral transit;Reduced posterior propulsion Oral - Regular: Reduced posterior propulsion;Weak lingual  manipulation;Delayed oral transit Oral Phase - Comment Oral Phase - Comment: pt flexes head  to aid oral transiting   Pharyngeal Phase Pharyngeal Phase Pharyngeal Phase: Impaired Pharyngeal - Thin Pharyngeal - Thin Teaspoon: Reduced tongue base  retraction;Pharyngeal residue - valleculae;Pharyngeal residue -  pyriform sinuses;Premature spillage to valleculae Pharyngeal - Thin Cup: Reduced tongue base  retraction;Penetration/Aspiration during swallow;Pharyngeal  residue - pyriform sinuses;Pharyngeal residue -  valleculae;Premature spillage to pyriform sinuses Penetration/Aspiration details (thin cup): Material enters  airway, remains ABOVE vocal cords then ejected out Pharyngeal - Thin Straw: Reduced tongue base  retraction;Penetration/Aspiration during swallow;Pharyngeal  residue - valleculae;Pharyngeal residue - pyriform  sinuses;Premature spillage to valleculae Penetration/Aspiration details (thin straw): Material enters  airway, remains ABOVE vocal cords then ejected out Pharyngeal - Solids Pharyngeal - Puree: Reduced tongue base retraction;Pharyngeal  residue - valleculae;Premature spillage to valleculae Pharyngeal - Regular: Reduced tongue base retraction;Pharyngeal  residue - valleculae;Premature spillage to valleculae Pharyngeal Phase - Comment Pharyngeal Comment: moderate residuals noted - worse with solids  than liquids, liquid swallows helpful to clear, dry swallows also  helpful- most notably with liquid clearance, chin tuck posture  did not prevent vallecular space residuals   Cervical Esophageal Phase    GO    Cervical Esophageal Phase Cervical Esophageal Phase:  (appearance of prominent CP did not  impair barium flow, pt appeared with mild amount of stasis in  distal esophagus  - ? small area corkscrew appearance mid area,  could not see to diaphgram and radiologist not present to confirm  findings)         Luanna Salk, [facility] [place] [phone]  Scheduled Meds: . antiseptic oral rinse  7 mL Mouth Rinse BID  . arformoterol  15 mcg Nebulization BID  . budesonide (PULMICORT) nebulizer  solution  0.25 mg Nebulization BID  . feeding supplement (ENSURE COMPLETE)  237 mL Oral TID BM  . ferrous sulfate  325 mg Oral BID WC  . fluticasone  2 spray Each Nare Daily  . ipratropium-albuterol  3 mL Nebulization QID  . levofloxacin  750 mg Oral Q48H  . megestrol  400 mg Oral Daily  . pantoprazole  40 mg Oral BID AC  . predniSONE  60 mg Oral Q breakfast  . sodium chloride  3 mL Intravenous Q12H   Continuous Infusions:   Principal Problem:   Dyspnea Active Problems:   COPD   PULMONARY EMBOLISM, HX OF   Anemia   Abdominal pain   Hyponatremia   Protein-calorie malnutrition, severe   Diarrhea   Chest pain    Time spent: 30 min    Monique Hefty, [facility] Pager [phone]. If 7PM-7AM, please contact night-coverage at [email or web address], password [facility] 06/28/2014, 3:07 PM  LOS: 4 days

## 2014-06-28 NOTE — Progress Notes (Signed)
This patient is receiving Levaquin. Based on criteria approved by the Pharmacy and Therapeutics Committee, this medication is being converted to the equivalent oral dose form. These criteria include:   . The patient is eating (either orally or per tube) and/or has been taking other orally administered medications for at least 24 hours.  . Afebrile x 24hrs.   If you have questions about this conversion, please contact the pharmacy department.  Romeo Rabon, PharmD, pager [phone]. 06/28/2014,10:06 AM.

## 2014-06-28 NOTE — Progress Notes (Signed)
Speech Language Pathology Treatment: Dysphagia  Patient Details Name: Anthony [name] MRN: [id number] DOB: [birth year] Today's Date: 06/28/2014 Time: 0940-1000 SLP Time Calculation (min): 20 min  Assessment / Plan / Recommendation Clinical Impression  Pt resting in bed. RN present to administer meds. Reviewed safe swallow precautions per MBS completed 06/27/14. Pt indicated tolerance of dys 2 diet, and dislike of pureed meats. Pt also reported goal to continue advancing diet back to baseline as strength continues to improve. No overt s/s aspiration or clinical indication of airway compromise observed at this time. Recommend follow up at SNF for readiness to advance, and tolerance of advanced diet. Acute ST to sign off for now. Please reconsult if needs arise.    HPI HPI: 78 yo male adm to [facility] with chest tightness, dyspnea.   Pt with PMH + for COPD, sickle cell anemia, and pulmonary embolism.  He has previously been seen by SLP for BSE.  He continues to report dysphagia to solid foods sensing stasis in phayrnx.  Pt also reported early satiety during last admit.  H/o gastritis but normal esophagus per endoscopy March 2014.  MD ordered swallow evaluation and after discussing - clinical eval was cancelled to proceed to instrumental evaluation.   SLP in to assess diet tolerance and complete education.   Pertinent Vitals Pain Assessment: No/denies pain  SLP Plan  All goals met;Discharge SLP treatment due to (comment)    Recommendations Diet recommendations: Dysphagia 2 (fine chop);Thin liquid (advance diet as pt strength and endurance improve) Liquids provided via: Straw;Cup Medication Administration: Whole meds with liquid Supervision: Patient able to self feed Compensations: Slow rate;Small sips/bites;Follow solids with liquid;Multiple dry swallows after each bite/sip (begin meal with liquids) Postural Changes and/or Swallow Maneuvers: Seated upright 90 degrees;Upright 30-60 min after meal            Oral Care Recommendations: Oral care BID Follow up Recommendations: Skilled Nursing facility Plan: All goals met;Discharge SLP treatment due to (comment)    GO    Anthony [name] [facility], [place]  Shonna Chock 06/28/2014, 10:06 AM

## 2014-06-28 NOTE — Progress Notes (Deleted)
SLP Cancellation Note  Patient Details Name: CRIMSON BEER MRN: [id number] DOB: [birth year]   Cancelled treatment:           Shonna Chock 06/28/2014, 10:05 AM

## 2014-06-29 LAB — RENAL FUNCTION PANEL
ALBUMIN: 3 g/dL — AB (ref 3.5–5.2)
Anion gap: 5 (ref 5–15)
BUN: 19 mg/dL (ref 6–23)
CALCIUM: 9.3 mg/dL (ref 8.4–10.5)
CO2: 37 mEq/L — ABNORMAL HIGH (ref 19–32)
Chloride: 93 mEq/L — ABNORMAL LOW (ref 96–112)
Creatinine, Ser: 0.85 mg/dL (ref 0.50–1.35)
GFR calc Af Amer: >90 mL/min (ref >90)
GFR calc non Af Amer: 83 mL/min — ABNORMAL LOW (ref >90)
Glucose, Bld: 140 mg/dL — ABNORMAL HIGH (ref 70–99)
PHOSPHORUS: 3 mg/dL (ref 2.3–4.6)
Potassium: 5.6 mEq/L — ABNORMAL HIGH (ref 3.7–5.3)
SODIUM: 135 meq/L — AB (ref 137–147)

## 2014-06-29 LAB — CBC
HCT: 29 % — ABNORMAL LOW (ref 39.0–52.0)
HEMOGLOBIN: 9.7 g/dL — AB (ref 13.0–17.0)
MCH: 27.2 pg (ref 26.0–34.0)
MCHC: 33.4 g/dL (ref 30.0–36.0)
MCV: 81.5 fL (ref 78.0–100.0)
Platelets: 114 10*3/uL — ABNORMAL LOW (ref 150–400)
RBC: 3.56 MIL/uL — ABNORMAL LOW (ref 4.22–5.81)
RDW: 22.5 % — AB (ref 11.5–15.5)
WBC: 4.8 10*3/uL (ref 4.0–10.5)

## 2014-06-29 LAB — MAGNESIUM: Magnesium: 2 mg/dL (ref 1.5–2.5)

## 2014-06-29 LAB — TYPE AND SCREEN
ABO/RH(D): A POS
Antibody Screen: NEGATIVE
Unit division: 0

## 2014-06-29 LAB — RETICULOCYTES
RBC.: 3.56 MIL/uL — AB (ref 4.22–5.81)
Retic Count, Absolute: 32 10*3/uL (ref 19.0–186.0)
Retic Ct Pct: 0.9 % (ref 0.4–3.1)

## 2014-06-29 LAB — TSH: TSH: 1.73 u[IU]/mL (ref 0.350–4.500)

## 2014-06-29 LAB — SEDIMENTATION RATE: SED RATE: 16 mm/h (ref 0–16)

## 2014-06-29 LAB — HIV ANTIBODY (ROUTINE TESTING W REFLEX): HIV: NONREACTIVE

## 2014-06-29 MED ORDER — PREDNISONE 20 MG PO TABS
40.0000 mg | ORAL_TABLET | Freq: Every day | ORAL | Status: DC
  Administered 2014-06-30 – 2014-07-01 (×2): 40 mg via ORAL
  Filled 2014-06-29 (×3): qty 2

## 2014-06-29 MED ORDER — BISACODYL 10 MG RE SUPP: 10.0000 mg | Freq: Every day | RECTAL | Status: DC | PRN

## 2014-06-29 NOTE — Progress Notes (Addendum)
[facility] PROGRESS NOTE  Anthony [name] ALP:[id number] DOB: 06-11-1936 DOA: 06/24/2014 PCP: Philis Fendt, MD  Assessment/Plan  Severe protein calorie malnutrition with a 22 pound weight loss and cachectic, unable to eat. Since we have been unable to identify an etiology for his weight loss, I am concerned that he will continue to lose weight.  His prognosis is guarded.  He declined palliative care consultation, however, he was amenable to discussion code status with me today.  He is now DNR/DNI.  The conversation was not witnessed by RN or family, but I did relay what we discussed to his nurse Susie afterwards.   -  PSA, CEA and AFP wnl -  Continue megace, started 8/27 -  Regular diet and encouraged to use supplements -  Appreciate nutrition recommendations -  Day 1 calorie count -  Palliative care to follow at SNF  COPD on chronic oxygen with acute exacerbation, starting to improve -  Decrease prednisone  -  Continue levofloxacin day 4 -  Continue duonebs -  Continue budesonide and brovana, but convert to [facility] or symbicort or advair at discharge  Chest tightness and dyspnea, peak troponin of 0.35, likely demand ischemia.  CT angio chest negative for pneumonia and for pulmonary embolism.  -  Continue to treat for acute COPD exacerbation -  Encouraged him to eat liberally and take his supplements -  Continue morphine prn  Globus sensation with solids likely due to dyspnea, deconditioning -  MBS completed 8/27 -  Appreciate Speech therapy assistance -  Continue dysphagia 2   GI bleed, occult positive stool, hemoglobin continuing to trend down, had diverticulosis and hemorrhoids on colonoscopy last year by Dr. Benson Norway for same indication.  Given breathing problems and frailty, I do not think that he is currently a good candidate for repeat endoscopy.   -  Transfused 1 unit PRBC on 8/28  Diarrhea with suprapubic abdominal pain, C. difficile not obtained because the patient  no longer having diarrhea. liver function tests and urinalysis within normal limits except for albumin of 3.1. Lipase 81, decreased from prior.  admissions.  -  PVR wnl -  lactic acid 1.3 -  Continue colace, senna, docusate, miralax -  Add prn bisacodyl  History of pulmonary embolism, no longer on anticoagulation secondary to GI bleed. No acute PE on CT anterior chest.   Weakness, likely secondary to malnutrition, physical therapy recommending skilled nursing for ongoing PT/OT.   Hyponatremia, chronic likely secondary to recent osmostat and dehydration, and improved with IVF.   Leukopenia, mild and resolved spontaneously -  F/u HIV   Iron deficiency anemia, marrow suppression, and underlying sickle cell, started on iron supplementation.  -  Iron studies, B12, folate all wnl a few days ago -  Retic low and TSH wnl -  Transfused 1 unit 8/28 -  Consider bone marrow biopsy  Thrombocytopenia, improved today.   -  Lab holiday on Sunday  Diet:  Dysphagia 2 with thin Access:  PIV IVF:  off Proph:  SCD  Code Status: DNR/DNI, although patient would be amenable to trial of bipap Family Communication: patient alone Disposition Plan: pending improvement in dyspnea, to SNF on Monday with palliative care to follow   Consultants:  cardiology  Procedures:  KUB  CXR  CT abd/pelvis  CT chest   Antibiotics:  Levofloxacin    HPI/Subjective:  Some improvement in breathing, although eating is not going well today.  Denies nausea, vomiting, diarrhea.  No BM since admission 6  days ago.  Still having intermittent severe lower abdominal pain.    Objective: Filed Vitals:   06/28/14 2103 06/29/14 0019 06/29/14 0515 06/29/14 0835  BP: 123/50  115/46   Pulse: 96  102   Temp: 97.7 F (36.5 C)  97.4 F (36.3 C)   TempSrc: Oral  Oral   Resp: 20  18   Height:      Weight:      SpO2: 100% 100% 100% 97%    Intake/Output Summary (Last 24 hours) at 06/29/14 1222 Last data filed at  06/29/14 0532  Gross per 24 hour  Intake 784.31 ml  Output    600 ml  Net 184.31 ml   Filed Weights   06/25/14 0544  Weight: 42.5 kg (93 lb 11.1 oz)    Exam:   General:  Cachectic BM, No acute distress, lying on right side curled in bed  HEENT:  NCAT, MMM  Cardiovascular:  RRR, nl S1, S2 no mrg, 2+ pulses, warm extremities  Respiratory:  Diminished bilateral BS to apices, no wheezes, rhonchi, or rales, tachypneic, no increased WOB  Abdomen:   NABS, soft, ND, TTP with guarding suprapubic area  MSK:   Normal tone and bulk, no LEE  Neuro:  Grossly moves all extremities  Data Reviewed: Basic Metabolic Panel:  Recent Labs Lab 06/25/14 0509 06/26/14 0406 06/27/14 0430 06/28/14 0445 06/29/14 0426  NA 132* 138 134* 136* 135*  K 4.4 4.5 5.3 4.5 5.6*  CL 92* 97 94* 95* 93*  CO2 29 30 30  34* 37*  GLUCOSE 83 88 231* 87 140*  BUN 7 11 12 18 19   CREATININE 0.74 0.86 0.94 0.94 0.85  CALCIUM 9.1 9.2 9.1 9.3 9.3  MG  --   --   --  1.5 2.0  PHOS  --   --   --  2.4 3.0   Liver Function Tests:  Recent Labs Lab 06/24/14 1445 06/26/14 0406 06/27/14 0430 06/28/14 0445 06/29/14 0426  AST 33 23 20  --   --   ALT 10 7 6   --   --   ALKPHOS 59 50 47  --   --   BILITOT 0.9 1.0 0.8  --   --   PROT 8.2 6.9 7.1  --   --   ALBUMIN 3.6 3.1* 3.0* 3.0* 3.0*    Recent Labs Lab 06/24/14 1445  LIPASE 81*   No results found for this basename: AMMONIA,  in the last 168 hours CBC:  Recent Labs Lab 06/24/14 1445 06/25/14 0509 06/26/14 0406 06/26/14 1059 06/27/14 0430 06/28/14 0445 06/29/14 0426  WBC 4.9 6.2 5.7  --  3.2* 4.6 4.8  NEUTROABS 2.7  --   --   --   --   --   --   HGB 10.8* 10.5* 9.2* 9.2* 8.8* 7.6* 9.7*  HCT 33.5* 32.4* 28.2* 28.0* 26.9* 22.6* 29.0*  MCV 77.2* 77.7* 77.5*  --  77.3* 76.1* 81.5  PLT 151 140* 140*  --  126* 97* 114*   Cardiac Enzymes:  Recent Labs Lab 06/25/14 0139 06/25/14 0358 06/25/14 1014 06/25/14 1600  TROPONINI 0.33* 0.35* <0.30  <0.30   BNP (last 3 results) No results found for this basename: PROBNP,  in the last 8760 hours CBG:  Recent Labs Lab 06/24/14 1539 06/28/14 1646 06/28/14 2105  GLUCAP 78 119* 159*    Recent Results (from the past 240 hour(s))  MRSA PCR SCREENING     Status: None   Collection Time  06/25/14  6:08 AM      Result Value Ref Range Status   MRSA by PCR NEGATIVE  NEGATIVE Final   Comment:            The GeneXpert MRSA Assay (FDA     approved for NASAL specimens     only), is one component of a     comprehensive MRSA colonization     surveillance program. It is not     intended to diagnose MRSA     infection nor to guide or     monitor treatment for     MRSA infections.     Studies: Dg Swallowing Func-speech Pathology  06/27/2014   Macario Golds, CCC-SLP     06/27/2014  3:22 PM Objective Swallowing Evaluation: Modified Barium Swallowing Study   Patient Details  Name: GEDALYA JIM MRN: [id number] Date of Birth: 05-06-1936  Today's Date: 06/27/2014 Time: 8338-2505 SLP Time Calculation (min): 32 min  Past Medical History:  Past Medical History  Diagnosis Date  . COPD (chronic obstructive pulmonary disease)   . Sickle cell anemia   . Pulmonary embolism    Past Surgical History:  Past Surgical History  Procedure Laterality Date  . No past surgeries    . Colonoscopy N/A 03/09/2013    Procedure: COLONOSCOPY;  Surgeon: Beryle Beams, MD;   Location: [place];  Service: Endoscopy;  Laterality: N/A;  . Esophagogastroduodenoscopy N/A 03/09/2013    Procedure: ESOPHAGOGASTRODUODENOSCOPY (EGD);  Surgeon: Beryle Beams, MD;  Location: [facility] ENDOSCOPY;  Service: Endoscopy;   Laterality: N/A;   HPI:  78 yo male adm to [facility] with chest tightness, dyspnea.   Pt with  PMH + for COPD, sickle cell anemia, and pulmonary embolism.  He  has previously been seen by SLP for BSE.  He continues to report  dysphagia to solid foods sensing stasis in phayrnx.  Pt also  reported early satiety during last admit.   H/o gastritis but  normal esophagus per endoscopy March 2014.  MD ordered swallow  evaluation and after discussing - clinical eval was cancelled to  proceed to instrumental evaluation.       Assessment / Plan / Recommendation Clinical Impression  Dysphagia Diagnosis: Mild pharyngeal phase dysphagia;Mild oral  phase dysphagia  Clinical impression: Pt presents with mild oropharyngeal  dysphagia with weakness.   Decreased oral bolus containment noted  with pt extending head upward to aid oral transiting.  Tongue  base retraction was compromised and resulted in stasis of  solids/pudding- as pt identifies.  Liquid swallows aid clearance  of pharynx.    Suspect pt's dysphagia is due to his poor dentition, xerostomia,  deconditioning and dyspnea impacting swallow/breathing  coordination.  Also suspect his COPD (calories using to breathe),  kyphosis and pt's report (during early August 2015 admit of early  satiety) impacts his nutrition level.      Advised pt to compensation strategies to increase efficiency and  decrease effort when eating.  Using teach back, live video, SLP  provided pt with instructions to precautions/compensation.      Recommend dietician consult to help maximize intake.      Treatment Recommendation  Therapy as outlined in treatment plan below    Diet Recommendation Dysphagia 2 (Fine chop);Thin liquid - extra  gravy/sauce    Liquid Administration via: Straw;Cup Medication Administration: Whole meds with liquid Supervision: Patient able to self feed Compensations: Slow rate;Small sips/bites (start meal with  liquids, follow solids with liquids, intermittent  dry swallow) Postural Changes and/or Swallow Maneuvers: Seated upright 90  degrees;Upright 30-60 min after meal    Other  Recommendations Recommended Consults: Other (Comment)  (dietician to help pt maximize intake) Oral Care Recommendations: Oral care BID   Follow Up Recommendations  SLP at next venue of care briefly for dysphagia management     Frequency and Duration min 1 x/week  1 week     General Date of Onset: 06/07/14 HPI: 78 yo male adm to [facility] with chest tightness, dyspnea.   Pt  with PMH + for COPD, sickle cell anemia, and pulmonary embolism.   He has previously been seen by SLP for BSE.  He continues to  report dysphagia to solid foods sensing stasis in phayrnx.  Pt  also reported early satiety during last admit.  H/o gastritis but  normal esophagus per endoscopy March 2014.  MD ordered swallow  evaluation and after discussing - clinical eval was cancelled to  proceed to instrumental evaluation.   Type of Study: Modified Barium Swallowing Study Reason for Referral: Objectively evaluate swallowing function Previous Swallow Assessment: bse 06/07/2014 Diet Prior to this Study: Dysphagia 2 (chopped);Thin liquids Respiratory Status: Nasal cannula History of Recent Intubation: No Behavior/Cognition: Alert;Cooperative;Pleasant mood Oral Cavity - Dentition: Poor condition (poor condition) Oral Motor / Sensory Function: Within functional limits Self-Feeding Abilities: Able to feed self Patient Positioning: Postural control interferes with function  (pt leans forward and appears kyphotic and this may impair  clearance of food/drink) Baseline Vocal Quality: Clear Volitional Cough: Strong Volitional Swallow: Able to elicit Anatomy: Within functional limits Pharyngeal Secretions: Standing secretions in (comment) (mild  secretions appeared in pharynx mixed with barium)    Reason for Referral Objectively evaluate swallowing function   Oral Phase Oral Preparation/Oral Phase Oral Phase: Impaired Oral - Thin Oral - Thin Teaspoon: Reduced posterior propulsion;Weak lingual  manipulation;Piecemeal swallowing;Delayed oral transit Oral - Thin Cup: Weak lingual manipulation;Reduced posterior  propulsion;Piecemeal swallowing;Delayed oral transit Oral - Thin Straw: Weak lingual manipulation;Reduced posterior  propulsion;Piecemeal swallowing;Delayed oral transit Oral - Solids  Oral - Puree: Delayed oral transit;Reduced posterior propulsion Oral - Regular: Reduced posterior propulsion;Weak lingual  manipulation;Delayed oral transit Oral Phase - Comment Oral Phase - Comment: pt flexes head to aid oral transiting   Pharyngeal Phase Pharyngeal Phase Pharyngeal Phase: Impaired Pharyngeal - Thin Pharyngeal - Thin Teaspoon: Reduced tongue base  retraction;Pharyngeal residue - valleculae;Pharyngeal residue -  pyriform sinuses;Premature spillage to valleculae Pharyngeal - Thin Cup: Reduced tongue base  retraction;Penetration/Aspiration during swallow;Pharyngeal  residue - pyriform sinuses;Pharyngeal residue -  valleculae;Premature spillage to pyriform sinuses Penetration/Aspiration details (thin cup): Material enters  airway, remains ABOVE vocal cords then ejected out Pharyngeal - Thin Straw: Reduced tongue base  retraction;Penetration/Aspiration during swallow;Pharyngeal  residue - valleculae;Pharyngeal residue - pyriform  sinuses;Premature spillage to valleculae Penetration/Aspiration details (thin straw): Material enters  airway, remains ABOVE vocal cords then ejected out Pharyngeal - Solids Pharyngeal - Puree: Reduced tongue base retraction;Pharyngeal  residue - valleculae;Premature spillage to valleculae Pharyngeal - Regular: Reduced tongue base retraction;Pharyngeal  residue - valleculae;Premature spillage to valleculae Pharyngeal Phase - Comment Pharyngeal Comment: moderate residuals noted - worse with solids  than liquids, liquid swallows helpful to clear, dry swallows also  helpful- most notably with liquid clearance, chin tuck posture  did not prevent vallecular space residuals   Cervical Esophageal Phase    GO    Cervical Esophageal Phase Cervical Esophageal Phase:  (appearance of prominent CP did not  impair barium flow, pt appeared with mild  amount of stasis in  distal esophagus  - ? small area corkscrew appearance mid area,  could not see to diaphgram and radiologist not present to  confirm  findings)         Luanna Salk, [place] [facility] SLP [phone]     Scheduled Meds: . antiseptic oral rinse  7 mL Mouth Rinse BID  . arformoterol  15 mcg Nebulization BID  . budesonide (PULMICORT) nebulizer solution  0.25 mg Nebulization BID  . docusate sodium  100 mg Oral BID  . feeding supplement (ENSURE COMPLETE)  237 mL Oral TID BM  . ferrous sulfate  325 mg Oral BID WC  . fluticasone  2 spray Each Nare Daily  . ipratropium-albuterol  3 mL Nebulization QID  . levofloxacin  750 mg Oral Q48H  . megestrol  400 mg Oral Daily  . pantoprazole  40 mg Oral BID AC  . polyethylene glycol  17 g Oral Daily  . predniSONE  60 mg Oral Q breakfast  . senna  2 tablet Oral QHS  . sodium chloride  3 mL Intravenous Q12H   Continuous Infusions:   Principal Problem:   Dyspnea Active Problems:   COPD   PULMONARY EMBOLISM, HX OF   Anemia   Abdominal pain   Hyponatremia   Protein-calorie malnutrition, severe   Diarrhea   Chest pain    Time spent: 30 min    Maleigh Bagot, [facility] Pager [phone]. If 7PM-7AM, please contact night-coverage at [email or web address], password [facility] 06/29/2014, 12:22 PM  LOS: 5 days

## 2014-06-30 LAB — CBC
HCT: 29.5 % — ABNORMAL LOW (ref 39.0–52.0)
Hemoglobin: 9.6 g/dL — ABNORMAL LOW (ref 13.0–17.0)
MCH: 26.5 pg (ref 26.0–34.0)
MCHC: 32.5 g/dL (ref 30.0–36.0)
MCV: 81.5 fL (ref 78.0–100.0)
PLATELETS: 107 10*3/uL — AB (ref 150–400)
RBC: 3.62 MIL/uL — ABNORMAL LOW (ref 4.22–5.81)
RDW: 23.6 % — AB (ref 11.5–15.5)
WBC: 6.7 10*3/uL (ref 4.0–10.5)

## 2014-06-30 LAB — MAGNESIUM: Magnesium: 1.9 mg/dL (ref 1.5–2.5)

## 2014-06-30 LAB — RENAL FUNCTION PANEL
ALBUMIN: 2.9 g/dL — AB (ref 3.5–5.2)
Anion gap: 6 (ref 5–15)
BUN: 24 mg/dL — ABNORMAL HIGH (ref 6–23)
CALCIUM: 9.4 mg/dL (ref 8.4–10.5)
CO2: 38 mEq/L — ABNORMAL HIGH (ref 19–32)
Chloride: 90 mEq/L — ABNORMAL LOW (ref 96–112)
Creatinine, Ser: 0.91 mg/dL (ref 0.50–1.35)
GFR calc Af Amer: >90 mL/min (ref >90)
GFR, EST NON AFRICAN AMERICAN: 80 mL/min — AB (ref >90)
Glucose, Bld: 143 mg/dL — ABNORMAL HIGH (ref 70–99)
Phosphorus: 2.4 mg/dL (ref 2.3–4.6)
Potassium: 5.7 mEq/L — ABNORMAL HIGH (ref 3.7–5.3)
SODIUM: 134 meq/L — AB (ref 137–147)

## 2014-06-30 MED ORDER — ENSURE COMPLETE PO LIQD
237.0000 mL | Freq: Three times a day (TID) | ORAL | Status: DC
  Administered 2014-06-30 – 2014-07-01 (×3): 237 mL via ORAL

## 2014-06-30 MED ORDER — BISACODYL 10 MG RE SUPP: 10.0000 mg | Freq: Once | RECTAL | Status: DC

## 2014-06-30 NOTE — Progress Notes (Signed)
ANTIBIOTIC CONSULT NOTE - FOLLOW UP  Pharmacy Consult for Levaquin Indication: acute COPD exacerbation  No Known Allergies  Patient Measurements: Height: 5\' 10"  (177.8 cm) Weight: 93 lb 11.1 oz (42.5 kg) IBW/kg (Calculated) : 73  Vital Signs: Temp: 98.9 F (37.2 C) (08/30 0641) Temp src: Oral (08/30 0641) BP: 130/56 mmHg (08/30 0641) Pulse Rate: 104 (08/30 0641) Intake/Output from previous day: 08/29 0701 - 08/30 0700 In: 600 [P.O.:600] Out: 1050 [Urine:1050]  Labs:  Recent Labs  06/28/14 0445 06/29/14 0426 06/30/14 0450 06/30/14 0452  WBC 4.6 4.8  --  6.7  HGB 7.6* 9.7*  --  9.6*  PLT 97* 114*  --  107*  CREATININE 0.94 0.85 0.91  --    Estimated Creatinine Clearance: 41.5 ml/min (by C-G formula based on Cr of 0.91).   Assessment: 17 yoM admitted with chest tightness, dyspnea. Pharmacy consulted to dose Levaquin for acute COPD exacerbation.  8/26 >> Levaquin >>  8/30:  Day #5 Levaquin  Tmax: afebrile  WBCs: wnl (prednisone)  Renal: SCr 0.91, CrCl 41 ml/min  No cultures  Auto-switched to PO on 8/28, noted poor PO intake, but appears to be tolerating oral medications.  Goal of Therapy:  Appropriate abx dosing, eradication of infection.   Plan:   Continue Levaquin 750mg  PO q48h  Follow up renal function and duration of therapy.  Gretta Arab PharmD, BCPS Pager [phone] 06/30/2014 9:44 AM

## 2014-06-30 NOTE — Progress Notes (Signed)
Nutrition Note  Calorie Count Order received.  Started today.  RD to follow up with documentation Monday.    Full RD assessment 8/27. Diet advanced to Regular.  Ensure Complete tid. Megace started.  Estimated needs:  1700-1900 kcal, 85-105 gm protein daily.  Antonieta Iba, RD, LDN Clinical Inpatient Dietitian Pager:  [phone] Weekend and after hours pager:  [phone]

## 2014-06-30 NOTE — Progress Notes (Signed)
[facility] PROGRESS NOTE  Anthony [name] YJE:[id number] DOB: [birth year] DOA: 06/24/2014 PCP: Philis Fendt, MD  Assessment/Plan  Severe protein calorie malnutrition with a 22 pound weight loss and cachectic, unable to eat. Since we have been unable to identify an etiology for his weight loss, I am concerned that he will continue to lose weight.  His prognosis is guarded.  He declined palliative care consultation, however, he was amenable to discussion code status with me today.  He is now DNR/DNI.  The conversation was not witnessed by RN or family, but I did relay what we discussed to his nurse Susie afterwards.   -  PSA, CEA and AFP wnl -  Continue megace, started 8/27 -  nonrestricted diet and encouraged to use supplements -  Appreciate nutrition recommendations -  Day 1 of calorie count  -  Palliative care to follow at SNF  COPD on chronic oxygen with acute exacerbation, starting to improve -  Continue prednisone 47m today and tomorrow -  Continue levofloxacin day 5 -  Continue duonebs -  Continue budesonide and brovana, but [facility] or advair at discharge  Chest tightness and dyspnea, peak troponin of 0.35, likely demand ischemia.  CT angio chest negative for pneumonia and for pulmonary embolism.  -  Continue to treat for acute COPD exacerbation -  Encouraged him to eat liberally and take his supplements -  Continue morphine prn  Globus sensation with solids likely due to dyspnea, deconditioning -  MBS completed 8/27 -  Appreciate Speech therapy assistance  GI bleed, occult positive stool, hemoglobin continuing to trend down, had diverticulosis and hemorrhoids on colonoscopy last year by Dr. HBenson Norwayfor same indication.  Given breathing problems and frailty, I do not think that he is currently a good candidate for repeat endoscopy.   -  Transfused 1 unit PRBC on 8/28  Diarrhea with suprapubic abdominal pain, C. difficile not obtained because the  patient no longer having diarrhea. liver function tests and urinalysis within normal limits except for albumin of 3.1. Lipase 81, decreased from prior.  admissions.  -  PVR wnl -  lactic acid 1.3 -  Continue colace, senna, docusate, miralax -  Give bisacodyl  History of pulmonary embolism, no longer on anticoagulation secondary to GI bleed. No acute PE on CT anterior chest.   Weakness, likely secondary to malnutrition, physical therapy recommending skilled nursing for ongoing PT/OT.   Hyponatremia, chronic likely secondary to recent osmostat and dehydration, and improved with IVF.   Leukopenia, mild and resolved spontaneously.  HIV neg  Iron deficiency anemia, marrow suppression, and underlying sickle cell, started on iron supplementation.  -  Iron studies, B12, folate all wnl a few days ago -  Retic low and TSH wnl -  Transfused 1 unit 8/28 -  Consider bone marrow biopsy  Thrombocytopenia, improved today.   -  Lab holiday tomorrow  Diet:  Dysphagia 2 with thin Access:  PIV IVF:  off Proph:  SCD  Code Status: DNR/DNI, although patient would be amenable to trial of bipap Family Communication: patient alone.   Disposition Plan:  Discussed possibility of hospice care, however, patient is not ready to accept hospice services at this time.  Most likely to SNF on Monday with palliative care to follow   Consultants:  cardiology  Procedures:  KUB  CXR  CT abd/pelvis  CT chest   Antibiotics:  Levofloxacin    HPI/Subjective:  Overall breathing has improved, but he still feels  SOB.  Eating is not going well but he has been drinking ensures.  Denies nausea, vomiting, diarrhea.  No BM.   Objective: Filed Vitals:   06/29/14 1354 06/29/14 2216 06/30/14 0641 06/30/14 0928  BP: 101/45 107/45 130/56   Pulse: 93 110 104   Temp: 98 F (36.7 C) 98.7 F (37.1 C) 98.9 F (37.2 C)   TempSrc: Oral Oral Oral   Resp: _0 Height:      Weight:      SpO2: 100% 100% 100%  97%    Intake/Output Summary (Last 24 hours) at 06/30/14 1000 Last data filed at 06/30/14 0955  Gross per 24 hour  Intake    657 ml  Output   1350 ml  Net   -693 ml   Filed Weights   06/25/14 0544  Weight: 42.5 kg (93 lb 11.1 oz)    Exam:   General:  Cachectic BM, No acute distress, sitting at side of bed pursed lip breathing  HEENT:  NCAT, MMM  Cardiovascular:  RRR, nl S1, S2 no mrg, 2+ pulses, warm extremities  Respiratory:  Diminished bilateral BS to apices, no wheezes, rhonchi, or rales, tachypneic, no increased WOB  Abdomen:   NABS, soft, ND, TTP with guarding suprapubic area  MSK:   Normal tone and bulk, no LEE  Neuro:  Grossly moves all extremities  Data Reviewed: Basic Metabolic Panel:  Recent Labs Lab 06/26/14 0406 06/27/14 0430 06/28/14 0445 06/29/14 0426 06/30/14 0450 06/30/14 0452  NA 138 134* 136* 135* 134*  --   K 4.5 5.3 4.5 5.6* 5.7*  --   CL 97 94* 95* 93* 90*  --   CO2 30 30 34* 37* 38*  --   GLUCOSE 88 231* 87 140* 143*  --   BUN _1 24*  --   CREATININE 0.86 0.94 0.94 0.85 0.91  --   CALCIUM 9.2 9.1 9.3 9.3 9.4  --   MG  --   --  1.5 2.0  --  1.9  PHOS  --   --  2.4 3.0 2.4  --    Liver Function Tests:  Recent Labs Lab 06/24/14 1445 06/26/14 0406 06/27/14 0430 06/28/14 0445 06/29/14 0426 06/30/14 0450  AST 33 23 20  --   --   --   ALT _2 --   --   --   ALKPHOS 59 50 47  --   --   --   BILITOT 0.9 1.0 0.8  --   --   --   PROT 8.2 6.9 7.1  --   --   --   ALBUMIN 3.6 3.1* 3.0* 3.0* 3.0* 2.9*    Recent Labs Lab 06/24/14 1445  LIPASE 81*   No results found for this basename: AMMONIA,  in the last 168 hours CBC:  Recent Labs Lab 06/24/14 1445  06/26/14 0406 06/26/14 1059 06/27/14 0430 06/28/14 0445 06/29/14 0426 06/30/14 0452  WBC 4.9  < > 5.7  --  3.2* 4.6 4.8 6.7  NEUTROABS 2.7  --   --   --   --   --   --   --   HGB 10.8*  < > 9.2* 9.2* 8.8* 7.6* 9.7* 9.6*  HCT 33.5*  < > 28.2* 28.0* 26.9* 22.6*  29.0* 29.5*  MCV 77.2*  < > 77.5*  --  77.3* 76.1* 81.5 81.5  PLT 151  < > 140*  --  126* 97*  114* 107*  < > = values in this interval not displayed. Cardiac Enzymes:  Recent Labs Lab 06/25/14 0139 06/25/14 0358 06/25/14 1014 06/25/14 1600  TROPONINI 0.33* 0.35* <0.30 <0.30   BNP (last 3 results) No results found for this basename: PROBNP,  in the last 8760 hours CBG:  Recent Labs Lab 06/24/14 1539 06/28/14 1646 06/28/14 2105  GLUCAP 78 119* 159*    Recent Results (from the past 240 hour(s))  MRSA PCR SCREENING     Status: None   Collection Time    06/25/14  6:08 AM      Result Value Ref Range Status   MRSA by PCR NEGATIVE  NEGATIVE Final   Comment:            The GeneXpert MRSA Assay (FDA     approved for NASAL specimens     only), is one component of a     comprehensive MRSA colonization     surveillance program. It is not     intended to diagnose MRSA     infection nor to guide or     monitor treatment for     MRSA infections.     Studies: No results found.  Scheduled Meds: . antiseptic oral rinse  7 mL Mouth Rinse BID  . arformoterol  15 mcg Nebulization BID  . budesonide (PULMICORT) nebulizer solution  0.25 mg Nebulization BID  . docusate sodium  100 mg Oral BID  . feeding supplement (ENSURE COMPLETE)  237 mL Oral TID BM  . ferrous sulfate  325 mg Oral BID WC  . fluticasone  2 spray Each Nare Daily  . ipratropium-albuterol  3 mL Nebulization QID  . levofloxacin  750 mg Oral Q48H  . megestrol  400 mg Oral Daily  . pantoprazole  40 mg Oral BID AC  . polyethylene glycol  17 g Oral Daily  . predniSONE  40 mg Oral Q breakfast  . senna  2 tablet Oral QHS  . sodium chloride  3 mL Intravenous Q12H   Continuous Infusions:   Principal Problem:   Dyspnea Active Problems:   COPD   PULMONARY EMBOLISM, HX OF   Anemia   Abdominal pain   Hyponatremia   Protein-calorie malnutrition, severe   Diarrhea   Chest pain    Time spent: 30 min    Valgene Deloatch,  [facility] Pager [phone]. If 7PM-7AM, please contact night-coverage at [email or web address], password [email or web address] 06/30/2014, 10:00 AM  LOS: 6 days

## 2014-07-01 DIAGNOSIS — R531 Weakness: Secondary | ICD-10-CM

## 2014-07-01 MED ORDER — BUDESONIDE-FORMOTEROL FUMARATE 160-4.5 MCG/ACT IN AERO: 2.0000 | INHALATION_SPRAY | Freq: Two times a day (BID) | RESPIRATORY_TRACT | Status: DC

## 2014-07-01 MED ORDER — MEGESTROL ACETATE 400 MG/10ML PO SUSP: 400.0000 mg | Freq: Every day | ORAL | Status: DC

## 2014-07-01 MED ORDER — IPRATROPIUM-ALBUTEROL 0.5-2.5 (3) MG/3ML IN SOLN: 3.0000 mL | Freq: Three times a day (TID) | RESPIRATORY_TRACT | Status: AC

## 2014-07-01 MED ORDER — MAGNESIUM CITRATE PO SOLN: 1.0000 | Freq: Once | ORAL | Status: DC

## 2014-07-01 MED ORDER — POLYETHYLENE GLYCOL 3350 17 G PO PACK: 17.0000 g | PACK | Freq: Two times a day (BID) | ORAL | Status: AC

## 2014-07-01 MED ORDER — MORPHINE SULFATE 15 MG PO TABS: 15.0000 mg | ORAL_TABLET | ORAL | Status: AC | PRN

## 2014-07-01 MED ORDER — LEVOFLOXACIN 750 MG PO TABS: 750.0000 mg | ORAL_TABLET | ORAL | Status: AC

## 2014-07-01 MED ORDER — PREDNISONE 20 MG PO TABS: 40.0000 mg | ORAL_TABLET | Freq: Every day | ORAL | Status: AC

## 2014-07-01 MED ORDER — SENNA 8.6 MG PO TABS: 2.0000 | ORAL_TABLET | Freq: Every day | ORAL | Status: AC

## 2014-07-01 MED ORDER — DSS 100 MG PO CAPS: 100.0000 mg | ORAL_CAPSULE | Freq: Two times a day (BID) | ORAL | Status: AC

## 2014-07-01 MED ORDER — BISACODYL 10 MG RE SUPP: 10.0000 mg | Freq: Every day | RECTAL | Status: AC | PRN

## 2014-07-01 MED ORDER — SALINE SPRAY 0.65 % NA SOLN: 1.0000 | NASAL | Status: DC | PRN

## 2014-07-01 MED ORDER — ENSURE PO LIQD: 237.0000 mL | Freq: Three times a day (TID) | ORAL | Status: AC

## 2014-07-01 MED ORDER — ALBUTEROL SULFATE (2.5 MG/3ML) 0.083% IN NEBU: 2.5000 mg | INHALATION_SOLUTION | RESPIRATORY_TRACT | Status: AC | PRN

## 2014-07-01 NOTE — Plan of Care (Signed)
Problem: Phase II Progression Outcomes Goal: Dyspnea controlled w/progressive activity Outcome: Not Met (add Reason) Patient is end stage COPD

## 2014-07-01 NOTE — Progress Notes (Signed)
Calorie Count Note  48 hour calorie count ordered.  Diet: Regular Supplements: Ensure Complete QID  Breakfast: 147 kcal, 0 gram protein Lunch: 366 kcal, 28 gram protein Dinner: 0% PO intake Supplements: 1400 kcal, 52 gram protein (equivalent of four Ensure Complete)  Estimated needs: 1700-1900 kcal, 85-105 gm protein daily.   Total intake: 1913 kcal (100% of minimum estimated needs)  80 gram protein (94% of minimum estimated needs)  Assessment: -Pt continues to have minimal intake of meals; however is able to consume 3-4 Ensure Complete daily, which assist in meeting estimated calorie needs. Pt will need to continue with supplement regimen upon d/c as it is unlikely pt will meet kcal/protein needs w/out Ensure/Boost or equivalent supplement. -Offered pt MagicCup protein supplement, which pt enjoyed. Will order with meals, and relayed to encourage additional supplement to RN -Reported to be tolerating regular diet texture; has been ordering softer foods to assist in tolerance (baked fish, eggs, ice cream) -Pt reported SOB and early satiety that inhibit intake of meals  Nutrition Dx: Inadequate oral intake related to inability to eat as evidenced by NPO; ongoing, now related to decreased appetite/SOB as evidenced by PO intake < 50%; improving with supplementation   Goal: Pt to meet >/= 90% of their estimated nutrition needs -progressing with supplementation  Intervention:  -Continue to recommend Ensure Complete QID -Recommend addition of MagicCup TID w/meals -Continue with Megace -Continue with liberalized diet to encourage PO intake -Encouraged PO intake and feeding assistance from staff   McDonough [place] Clinical Dietitian [phone]

## 2014-07-01 NOTE — Progress Notes (Signed)
Patient is set to discharge to [facility] today. Patient & sons, Ronalee Belts (ph#: [phone]) & [name] (ph#: [phone]) aware. Discharge packet in [place], [place] aware. [facility] called for transport.   Clinical Social Work Department CLINICAL SOCIAL WORK PLACEMENT NOTE 07/01/2014  Patient:  Anthony [name], Anthony [name]  Account Number:  [id number] Admit date:  06/24/2014  Clinical Social Worker:  Renold Genta  Date/time:  06/26/2014 04:13 PM  Clinical Social Work is seeking post-discharge placement for this patient at the following level of care:   SKILLED NURSING   (*CSW will update this form in Epic as items are completed)   06/26/2014  Patient/family provided with [place] Department of Clinical Social Work's list of facilities offering this level of care within the geographic area requested by the patient (or if unable, by the patient's family).  06/26/2014  Patient/family informed of their freedom to choose among providers that offer the needed level of care, that participate in Medicare, Medicaid or managed care program needed by the patient, have an available bed and are willing to accept the patient.  06/26/2014  Patient/family informed of [facility]' ownership interest in [facility], as well as of the fact that they are under no obligation to receive care at this facility.  PASARR submitted to EDS on 06/26/2014 PASARR number received on 06/26/2014  FL2 transmitted to all facilities in geographic area requested by pt/family on  06/26/2014 FL2 transmitted to all facilities within larger geographic area on   Patient informed that his/her managed care company has contracts with or will negotiate with  certain facilities, including the following:     Patient/family informed of bed offers received:  06/26/2014 Patient chooses bed at [facility] Physician recommends and patient chooses bed at    Patient to be transferred to [facility] on  07/01/2014 Patient to be transferred to facility by [facility] Patient and family notified of transfer on 07/01/2014 Name of family member notified:  patient's sons Wille Celeste via phone  The following physician request were entered in Epic:   Additional Comments:   Raynaldo Opitz, [place] Social Worker cell #: [phone]

## 2014-07-03 ENCOUNTER — Institutional Professional Consult (permissible substitution): Payer: PRIVATE HEALTH INSURANCE | Admitting: Internal Medicine

## 2014-08-06 ENCOUNTER — Institutional Professional Consult (permissible substitution): Payer: Medicare Other | Admitting: Internal Medicine

## 2014-08-06 ENCOUNTER — Encounter: Payer: Self-pay | Admitting: Internal Medicine

## 2014-11-01 DIAGNOSIS — I272 Other secondary pulmonary hypertension: Secondary | ICD-10-CM | POA: Diagnosis not present

## 2014-11-01 DIAGNOSIS — I2609 Other pulmonary embolism with acute cor pulmonale: Secondary | ICD-10-CM | POA: Diagnosis not present

## 2014-11-01 DIAGNOSIS — J449 Chronic obstructive pulmonary disease, unspecified: Secondary | ICD-10-CM | POA: Diagnosis not present

## 2014-11-01 DIAGNOSIS — D571 Sickle-cell disease without crisis: Secondary | ICD-10-CM | POA: Diagnosis not present

## 2014-11-04 DIAGNOSIS — D571 Sickle-cell disease without crisis: Secondary | ICD-10-CM | POA: Diagnosis not present

## 2014-11-04 DIAGNOSIS — I2609 Other pulmonary embolism with acute cor pulmonale: Secondary | ICD-10-CM | POA: Diagnosis not present

## 2014-11-04 DIAGNOSIS — I272 Other secondary pulmonary hypertension: Secondary | ICD-10-CM | POA: Diagnosis not present

## 2014-11-04 DIAGNOSIS — J449 Chronic obstructive pulmonary disease, unspecified: Secondary | ICD-10-CM | POA: Diagnosis not present

## 2014-11-05 DIAGNOSIS — J449 Chronic obstructive pulmonary disease, unspecified: Secondary | ICD-10-CM | POA: Diagnosis not present

## 2014-11-05 DIAGNOSIS — I2609 Other pulmonary embolism with acute cor pulmonale: Secondary | ICD-10-CM | POA: Diagnosis not present

## 2014-11-05 DIAGNOSIS — I272 Other secondary pulmonary hypertension: Secondary | ICD-10-CM | POA: Diagnosis not present

## 2014-11-05 DIAGNOSIS — D571 Sickle-cell disease without crisis: Secondary | ICD-10-CM | POA: Diagnosis not present

## 2014-11-06 DIAGNOSIS — J449 Chronic obstructive pulmonary disease, unspecified: Secondary | ICD-10-CM | POA: Diagnosis not present

## 2014-11-06 DIAGNOSIS — D571 Sickle-cell disease without crisis: Secondary | ICD-10-CM | POA: Diagnosis not present

## 2014-11-06 DIAGNOSIS — I272 Other secondary pulmonary hypertension: Secondary | ICD-10-CM | POA: Diagnosis not present

## 2014-11-06 DIAGNOSIS — I2609 Other pulmonary embolism with acute cor pulmonale: Secondary | ICD-10-CM | POA: Diagnosis not present

## 2014-11-07 DIAGNOSIS — I2609 Other pulmonary embolism with acute cor pulmonale: Secondary | ICD-10-CM | POA: Diagnosis not present

## 2014-11-07 DIAGNOSIS — J449 Chronic obstructive pulmonary disease, unspecified: Secondary | ICD-10-CM | POA: Diagnosis not present

## 2014-11-07 DIAGNOSIS — I272 Other secondary pulmonary hypertension: Secondary | ICD-10-CM | POA: Diagnosis not present

## 2014-11-07 DIAGNOSIS — D571 Sickle-cell disease without crisis: Secondary | ICD-10-CM | POA: Diagnosis not present

## 2014-11-08 DIAGNOSIS — J449 Chronic obstructive pulmonary disease, unspecified: Secondary | ICD-10-CM | POA: Diagnosis not present

## 2014-11-08 DIAGNOSIS — D571 Sickle-cell disease without crisis: Secondary | ICD-10-CM | POA: Diagnosis not present

## 2014-11-08 DIAGNOSIS — I272 Other secondary pulmonary hypertension: Secondary | ICD-10-CM | POA: Diagnosis not present

## 2014-11-08 DIAGNOSIS — I2609 Other pulmonary embolism with acute cor pulmonale: Secondary | ICD-10-CM | POA: Diagnosis not present

## 2014-11-12 DIAGNOSIS — I2609 Other pulmonary embolism with acute cor pulmonale: Secondary | ICD-10-CM | POA: Diagnosis not present

## 2014-11-12 DIAGNOSIS — J449 Chronic obstructive pulmonary disease, unspecified: Secondary | ICD-10-CM | POA: Diagnosis not present

## 2014-11-12 DIAGNOSIS — I272 Other secondary pulmonary hypertension: Secondary | ICD-10-CM | POA: Diagnosis not present

## 2014-11-12 DIAGNOSIS — D571 Sickle-cell disease without crisis: Secondary | ICD-10-CM | POA: Diagnosis not present

## 2014-11-13 DIAGNOSIS — I2609 Other pulmonary embolism with acute cor pulmonale: Secondary | ICD-10-CM | POA: Diagnosis not present

## 2014-11-13 DIAGNOSIS — I272 Other secondary pulmonary hypertension: Secondary | ICD-10-CM | POA: Diagnosis not present

## 2014-11-13 DIAGNOSIS — D571 Sickle-cell disease without crisis: Secondary | ICD-10-CM | POA: Diagnosis not present

## 2014-11-13 DIAGNOSIS — J449 Chronic obstructive pulmonary disease, unspecified: Secondary | ICD-10-CM | POA: Diagnosis not present

## 2014-11-14 DIAGNOSIS — I2609 Other pulmonary embolism with acute cor pulmonale: Secondary | ICD-10-CM | POA: Diagnosis not present

## 2014-11-14 DIAGNOSIS — K5289 Other specified noninfective gastroenteritis and colitis: Secondary | ICD-10-CM | POA: Diagnosis not present

## 2014-11-14 DIAGNOSIS — I272 Other secondary pulmonary hypertension: Secondary | ICD-10-CM | POA: Diagnosis not present

## 2014-11-14 DIAGNOSIS — J449 Chronic obstructive pulmonary disease, unspecified: Secondary | ICD-10-CM | POA: Diagnosis not present

## 2014-11-14 DIAGNOSIS — K219 Gastro-esophageal reflux disease without esophagitis: Secondary | ICD-10-CM | POA: Diagnosis not present

## 2014-11-14 DIAGNOSIS — D571 Sickle-cell disease without crisis: Secondary | ICD-10-CM | POA: Diagnosis not present

## 2014-11-15 DIAGNOSIS — I272 Other secondary pulmonary hypertension: Secondary | ICD-10-CM | POA: Diagnosis not present

## 2014-11-15 DIAGNOSIS — J449 Chronic obstructive pulmonary disease, unspecified: Secondary | ICD-10-CM | POA: Diagnosis not present

## 2014-11-15 DIAGNOSIS — I2609 Other pulmonary embolism with acute cor pulmonale: Secondary | ICD-10-CM | POA: Diagnosis not present

## 2014-11-15 DIAGNOSIS — D571 Sickle-cell disease without crisis: Secondary | ICD-10-CM | POA: Diagnosis not present

## 2014-11-18 DIAGNOSIS — D571 Sickle-cell disease without crisis: Secondary | ICD-10-CM | POA: Diagnosis not present

## 2014-11-18 DIAGNOSIS — J449 Chronic obstructive pulmonary disease, unspecified: Secondary | ICD-10-CM | POA: Diagnosis not present

## 2014-11-18 DIAGNOSIS — I272 Other secondary pulmonary hypertension: Secondary | ICD-10-CM | POA: Diagnosis not present

## 2014-11-18 DIAGNOSIS — I2609 Other pulmonary embolism with acute cor pulmonale: Secondary | ICD-10-CM | POA: Diagnosis not present

## 2014-11-19 DIAGNOSIS — I272 Other secondary pulmonary hypertension: Secondary | ICD-10-CM | POA: Diagnosis not present

## 2014-11-19 DIAGNOSIS — I2609 Other pulmonary embolism with acute cor pulmonale: Secondary | ICD-10-CM | POA: Diagnosis not present

## 2014-11-19 DIAGNOSIS — J449 Chronic obstructive pulmonary disease, unspecified: Secondary | ICD-10-CM | POA: Diagnosis not present

## 2014-11-19 DIAGNOSIS — D571 Sickle-cell disease without crisis: Secondary | ICD-10-CM | POA: Diagnosis not present

## 2014-11-20 DIAGNOSIS — J449 Chronic obstructive pulmonary disease, unspecified: Secondary | ICD-10-CM | POA: Diagnosis not present

## 2014-11-20 DIAGNOSIS — I2609 Other pulmonary embolism with acute cor pulmonale: Secondary | ICD-10-CM | POA: Diagnosis not present

## 2014-11-20 DIAGNOSIS — D571 Sickle-cell disease without crisis: Secondary | ICD-10-CM | POA: Diagnosis not present

## 2014-11-20 DIAGNOSIS — I272 Other secondary pulmonary hypertension: Secondary | ICD-10-CM | POA: Diagnosis not present

## 2014-11-21 DIAGNOSIS — I272 Other secondary pulmonary hypertension: Secondary | ICD-10-CM | POA: Diagnosis not present

## 2014-11-21 DIAGNOSIS — D571 Sickle-cell disease without crisis: Secondary | ICD-10-CM | POA: Diagnosis not present

## 2014-11-21 DIAGNOSIS — I2609 Other pulmonary embolism with acute cor pulmonale: Secondary | ICD-10-CM | POA: Diagnosis not present

## 2014-11-21 DIAGNOSIS — J449 Chronic obstructive pulmonary disease, unspecified: Secondary | ICD-10-CM | POA: Diagnosis not present

## 2014-11-22 DIAGNOSIS — J449 Chronic obstructive pulmonary disease, unspecified: Secondary | ICD-10-CM | POA: Diagnosis not present

## 2014-11-22 DIAGNOSIS — I272 Other secondary pulmonary hypertension: Secondary | ICD-10-CM | POA: Diagnosis not present

## 2014-11-22 DIAGNOSIS — D571 Sickle-cell disease without crisis: Secondary | ICD-10-CM | POA: Diagnosis not present

## 2014-11-22 DIAGNOSIS — I2609 Other pulmonary embolism with acute cor pulmonale: Secondary | ICD-10-CM | POA: Diagnosis not present

## 2014-11-26 DIAGNOSIS — I272 Other secondary pulmonary hypertension: Secondary | ICD-10-CM | POA: Diagnosis not present

## 2014-11-26 DIAGNOSIS — J449 Chronic obstructive pulmonary disease, unspecified: Secondary | ICD-10-CM | POA: Diagnosis not present

## 2014-11-26 DIAGNOSIS — I2609 Other pulmonary embolism with acute cor pulmonale: Secondary | ICD-10-CM | POA: Diagnosis not present

## 2014-11-26 DIAGNOSIS — D571 Sickle-cell disease without crisis: Secondary | ICD-10-CM | POA: Diagnosis not present

## 2014-11-27 DIAGNOSIS — J449 Chronic obstructive pulmonary disease, unspecified: Secondary | ICD-10-CM | POA: Diagnosis not present

## 2014-11-27 DIAGNOSIS — I272 Other secondary pulmonary hypertension: Secondary | ICD-10-CM | POA: Diagnosis not present

## 2014-11-27 DIAGNOSIS — D571 Sickle-cell disease without crisis: Secondary | ICD-10-CM | POA: Diagnosis not present

## 2014-11-27 DIAGNOSIS — I2609 Other pulmonary embolism with acute cor pulmonale: Secondary | ICD-10-CM | POA: Diagnosis not present

## 2014-11-28 DIAGNOSIS — I2609 Other pulmonary embolism with acute cor pulmonale: Secondary | ICD-10-CM | POA: Diagnosis not present

## 2014-11-28 DIAGNOSIS — J449 Chronic obstructive pulmonary disease, unspecified: Secondary | ICD-10-CM | POA: Diagnosis not present

## 2014-11-28 DIAGNOSIS — I272 Other secondary pulmonary hypertension: Secondary | ICD-10-CM | POA: Diagnosis not present

## 2014-11-28 DIAGNOSIS — D571 Sickle-cell disease without crisis: Secondary | ICD-10-CM | POA: Diagnosis not present

## 2014-11-29 DIAGNOSIS — I272 Other secondary pulmonary hypertension: Secondary | ICD-10-CM | POA: Diagnosis not present

## 2014-11-29 DIAGNOSIS — I2609 Other pulmonary embolism with acute cor pulmonale: Secondary | ICD-10-CM | POA: Diagnosis not present

## 2014-11-29 DIAGNOSIS — J449 Chronic obstructive pulmonary disease, unspecified: Secondary | ICD-10-CM | POA: Diagnosis not present

## 2014-11-29 DIAGNOSIS — D571 Sickle-cell disease without crisis: Secondary | ICD-10-CM | POA: Diagnosis not present

## 2014-12-02 DIAGNOSIS — J449 Chronic obstructive pulmonary disease, unspecified: Secondary | ICD-10-CM | POA: Diagnosis not present

## 2014-12-02 DIAGNOSIS — I2609 Other pulmonary embolism with acute cor pulmonale: Secondary | ICD-10-CM | POA: Diagnosis not present

## 2014-12-02 DIAGNOSIS — I272 Other secondary pulmonary hypertension: Secondary | ICD-10-CM | POA: Diagnosis not present

## 2014-12-02 DIAGNOSIS — D571 Sickle-cell disease without crisis: Secondary | ICD-10-CM | POA: Diagnosis not present

## 2014-12-03 DIAGNOSIS — I2609 Other pulmonary embolism with acute cor pulmonale: Secondary | ICD-10-CM | POA: Diagnosis not present

## 2014-12-03 DIAGNOSIS — D571 Sickle-cell disease without crisis: Secondary | ICD-10-CM | POA: Diagnosis not present

## 2014-12-03 DIAGNOSIS — J449 Chronic obstructive pulmonary disease, unspecified: Secondary | ICD-10-CM | POA: Diagnosis not present

## 2014-12-03 DIAGNOSIS — I272 Other secondary pulmonary hypertension: Secondary | ICD-10-CM | POA: Diagnosis not present

## 2014-12-04 DIAGNOSIS — I272 Other secondary pulmonary hypertension: Secondary | ICD-10-CM | POA: Diagnosis not present

## 2014-12-04 DIAGNOSIS — J449 Chronic obstructive pulmonary disease, unspecified: Secondary | ICD-10-CM | POA: Diagnosis not present

## 2014-12-04 DIAGNOSIS — I2609 Other pulmonary embolism with acute cor pulmonale: Secondary | ICD-10-CM | POA: Diagnosis not present

## 2014-12-04 DIAGNOSIS — D571 Sickle-cell disease without crisis: Secondary | ICD-10-CM | POA: Diagnosis not present

## 2014-12-05 DIAGNOSIS — J449 Chronic obstructive pulmonary disease, unspecified: Secondary | ICD-10-CM | POA: Diagnosis not present

## 2014-12-05 DIAGNOSIS — I2609 Other pulmonary embolism with acute cor pulmonale: Secondary | ICD-10-CM | POA: Diagnosis not present

## 2014-12-05 DIAGNOSIS — I272 Other secondary pulmonary hypertension: Secondary | ICD-10-CM | POA: Diagnosis not present

## 2014-12-05 DIAGNOSIS — D571 Sickle-cell disease without crisis: Secondary | ICD-10-CM | POA: Diagnosis not present

## 2014-12-06 DIAGNOSIS — I272 Other secondary pulmonary hypertension: Secondary | ICD-10-CM | POA: Diagnosis not present

## 2014-12-06 DIAGNOSIS — D571 Sickle-cell disease without crisis: Secondary | ICD-10-CM | POA: Diagnosis not present

## 2014-12-06 DIAGNOSIS — J449 Chronic obstructive pulmonary disease, unspecified: Secondary | ICD-10-CM | POA: Diagnosis not present

## 2014-12-06 DIAGNOSIS — I2609 Other pulmonary embolism with acute cor pulmonale: Secondary | ICD-10-CM | POA: Diagnosis not present

## 2014-12-09 DIAGNOSIS — D571 Sickle-cell disease without crisis: Secondary | ICD-10-CM | POA: Diagnosis not present

## 2014-12-09 DIAGNOSIS — J449 Chronic obstructive pulmonary disease, unspecified: Secondary | ICD-10-CM | POA: Diagnosis not present

## 2014-12-09 DIAGNOSIS — I272 Other secondary pulmonary hypertension: Secondary | ICD-10-CM | POA: Diagnosis not present

## 2014-12-09 DIAGNOSIS — I2609 Other pulmonary embolism with acute cor pulmonale: Secondary | ICD-10-CM | POA: Diagnosis not present

## 2014-12-10 DIAGNOSIS — I272 Other secondary pulmonary hypertension: Secondary | ICD-10-CM | POA: Diagnosis not present

## 2014-12-10 DIAGNOSIS — J449 Chronic obstructive pulmonary disease, unspecified: Secondary | ICD-10-CM | POA: Diagnosis not present

## 2014-12-10 DIAGNOSIS — D571 Sickle-cell disease without crisis: Secondary | ICD-10-CM | POA: Diagnosis not present

## 2014-12-10 DIAGNOSIS — I2609 Other pulmonary embolism with acute cor pulmonale: Secondary | ICD-10-CM | POA: Diagnosis not present

## 2014-12-11 DIAGNOSIS — J449 Chronic obstructive pulmonary disease, unspecified: Secondary | ICD-10-CM | POA: Diagnosis not present

## 2014-12-11 DIAGNOSIS — D571 Sickle-cell disease without crisis: Secondary | ICD-10-CM | POA: Diagnosis not present

## 2014-12-11 DIAGNOSIS — I2609 Other pulmonary embolism with acute cor pulmonale: Secondary | ICD-10-CM | POA: Diagnosis not present

## 2014-12-11 DIAGNOSIS — I272 Other secondary pulmonary hypertension: Secondary | ICD-10-CM | POA: Diagnosis not present

## 2014-12-12 DIAGNOSIS — D571 Sickle-cell disease without crisis: Secondary | ICD-10-CM | POA: Diagnosis not present

## 2014-12-12 DIAGNOSIS — I2609 Other pulmonary embolism with acute cor pulmonale: Secondary | ICD-10-CM | POA: Diagnosis not present

## 2014-12-12 DIAGNOSIS — I272 Other secondary pulmonary hypertension: Secondary | ICD-10-CM | POA: Diagnosis not present

## 2014-12-12 DIAGNOSIS — J449 Chronic obstructive pulmonary disease, unspecified: Secondary | ICD-10-CM | POA: Diagnosis not present

## 2014-12-13 DIAGNOSIS — I272 Other secondary pulmonary hypertension: Secondary | ICD-10-CM | POA: Diagnosis not present

## 2014-12-13 DIAGNOSIS — I2609 Other pulmonary embolism with acute cor pulmonale: Secondary | ICD-10-CM | POA: Diagnosis not present

## 2014-12-13 DIAGNOSIS — J449 Chronic obstructive pulmonary disease, unspecified: Secondary | ICD-10-CM | POA: Diagnosis not present

## 2014-12-13 DIAGNOSIS — D571 Sickle-cell disease without crisis: Secondary | ICD-10-CM | POA: Diagnosis not present

## 2014-12-16 DIAGNOSIS — I2609 Other pulmonary embolism with acute cor pulmonale: Secondary | ICD-10-CM | POA: Diagnosis not present

## 2014-12-16 DIAGNOSIS — D571 Sickle-cell disease without crisis: Secondary | ICD-10-CM | POA: Diagnosis not present

## 2014-12-16 DIAGNOSIS — J449 Chronic obstructive pulmonary disease, unspecified: Secondary | ICD-10-CM | POA: Diagnosis not present

## 2014-12-16 DIAGNOSIS — I272 Other secondary pulmonary hypertension: Secondary | ICD-10-CM | POA: Diagnosis not present

## 2014-12-17 DIAGNOSIS — J449 Chronic obstructive pulmonary disease, unspecified: Secondary | ICD-10-CM | POA: Diagnosis not present

## 2014-12-17 DIAGNOSIS — D571 Sickle-cell disease without crisis: Secondary | ICD-10-CM | POA: Diagnosis not present

## 2014-12-17 DIAGNOSIS — I272 Other secondary pulmonary hypertension: Secondary | ICD-10-CM | POA: Diagnosis not present

## 2014-12-17 DIAGNOSIS — I2609 Other pulmonary embolism with acute cor pulmonale: Secondary | ICD-10-CM | POA: Diagnosis not present

## 2014-12-18 DIAGNOSIS — I272 Other secondary pulmonary hypertension: Secondary | ICD-10-CM | POA: Diagnosis not present

## 2014-12-18 DIAGNOSIS — D571 Sickle-cell disease without crisis: Secondary | ICD-10-CM | POA: Diagnosis not present

## 2014-12-18 DIAGNOSIS — I2609 Other pulmonary embolism with acute cor pulmonale: Secondary | ICD-10-CM | POA: Diagnosis not present

## 2014-12-18 DIAGNOSIS — J449 Chronic obstructive pulmonary disease, unspecified: Secondary | ICD-10-CM | POA: Diagnosis not present

## 2014-12-19 DIAGNOSIS — J449 Chronic obstructive pulmonary disease, unspecified: Secondary | ICD-10-CM | POA: Diagnosis not present

## 2014-12-19 DIAGNOSIS — I272 Other secondary pulmonary hypertension: Secondary | ICD-10-CM | POA: Diagnosis not present

## 2014-12-19 DIAGNOSIS — I2609 Other pulmonary embolism with acute cor pulmonale: Secondary | ICD-10-CM | POA: Diagnosis not present

## 2014-12-19 DIAGNOSIS — D571 Sickle-cell disease without crisis: Secondary | ICD-10-CM | POA: Diagnosis not present

## 2014-12-20 DIAGNOSIS — J449 Chronic obstructive pulmonary disease, unspecified: Secondary | ICD-10-CM | POA: Diagnosis not present

## 2014-12-20 DIAGNOSIS — I2609 Other pulmonary embolism with acute cor pulmonale: Secondary | ICD-10-CM | POA: Diagnosis not present

## 2014-12-20 DIAGNOSIS — D571 Sickle-cell disease without crisis: Secondary | ICD-10-CM | POA: Diagnosis not present

## 2014-12-20 DIAGNOSIS — I272 Other secondary pulmonary hypertension: Secondary | ICD-10-CM | POA: Diagnosis not present

## 2014-12-23 DIAGNOSIS — I272 Other secondary pulmonary hypertension: Secondary | ICD-10-CM | POA: Diagnosis not present

## 2014-12-23 DIAGNOSIS — J449 Chronic obstructive pulmonary disease, unspecified: Secondary | ICD-10-CM | POA: Diagnosis not present

## 2014-12-23 DIAGNOSIS — D571 Sickle-cell disease without crisis: Secondary | ICD-10-CM | POA: Diagnosis not present

## 2014-12-23 DIAGNOSIS — I2609 Other pulmonary embolism with acute cor pulmonale: Secondary | ICD-10-CM | POA: Diagnosis not present

## 2014-12-24 DIAGNOSIS — I2609 Other pulmonary embolism with acute cor pulmonale: Secondary | ICD-10-CM | POA: Diagnosis not present

## 2014-12-24 DIAGNOSIS — I272 Other secondary pulmonary hypertension: Secondary | ICD-10-CM | POA: Diagnosis not present

## 2014-12-24 DIAGNOSIS — J449 Chronic obstructive pulmonary disease, unspecified: Secondary | ICD-10-CM | POA: Diagnosis not present

## 2014-12-24 DIAGNOSIS — D571 Sickle-cell disease without crisis: Secondary | ICD-10-CM | POA: Diagnosis not present

## 2014-12-25 DIAGNOSIS — J449 Chronic obstructive pulmonary disease, unspecified: Secondary | ICD-10-CM | POA: Diagnosis not present

## 2014-12-25 DIAGNOSIS — I272 Other secondary pulmonary hypertension: Secondary | ICD-10-CM | POA: Diagnosis not present

## 2014-12-25 DIAGNOSIS — I2609 Other pulmonary embolism with acute cor pulmonale: Secondary | ICD-10-CM | POA: Diagnosis not present

## 2014-12-25 DIAGNOSIS — D571 Sickle-cell disease without crisis: Secondary | ICD-10-CM | POA: Diagnosis not present

## 2014-12-26 DIAGNOSIS — I2609 Other pulmonary embolism with acute cor pulmonale: Secondary | ICD-10-CM | POA: Diagnosis not present

## 2014-12-26 DIAGNOSIS — I272 Other secondary pulmonary hypertension: Secondary | ICD-10-CM | POA: Diagnosis not present

## 2014-12-26 DIAGNOSIS — J449 Chronic obstructive pulmonary disease, unspecified: Secondary | ICD-10-CM | POA: Diagnosis not present

## 2014-12-26 DIAGNOSIS — D571 Sickle-cell disease without crisis: Secondary | ICD-10-CM | POA: Diagnosis not present

## 2014-12-27 DIAGNOSIS — I272 Other secondary pulmonary hypertension: Secondary | ICD-10-CM | POA: Diagnosis not present

## 2014-12-27 DIAGNOSIS — D571 Sickle-cell disease without crisis: Secondary | ICD-10-CM | POA: Diagnosis not present

## 2014-12-27 DIAGNOSIS — I2609 Other pulmonary embolism with acute cor pulmonale: Secondary | ICD-10-CM | POA: Diagnosis not present

## 2014-12-27 DIAGNOSIS — J449 Chronic obstructive pulmonary disease, unspecified: Secondary | ICD-10-CM | POA: Diagnosis not present

## 2014-12-30 DIAGNOSIS — I2609 Other pulmonary embolism with acute cor pulmonale: Secondary | ICD-10-CM | POA: Diagnosis not present

## 2014-12-30 DIAGNOSIS — D571 Sickle-cell disease without crisis: Secondary | ICD-10-CM | POA: Diagnosis not present

## 2014-12-30 DIAGNOSIS — I272 Other secondary pulmonary hypertension: Secondary | ICD-10-CM | POA: Diagnosis not present

## 2014-12-30 DIAGNOSIS — J449 Chronic obstructive pulmonary disease, unspecified: Secondary | ICD-10-CM | POA: Diagnosis not present

## 2014-12-31 DIAGNOSIS — J449 Chronic obstructive pulmonary disease, unspecified: Secondary | ICD-10-CM | POA: Diagnosis not present

## 2014-12-31 DIAGNOSIS — D571 Sickle-cell disease without crisis: Secondary | ICD-10-CM | POA: Diagnosis not present

## 2014-12-31 DIAGNOSIS — I2609 Other pulmonary embolism with acute cor pulmonale: Secondary | ICD-10-CM | POA: Diagnosis not present

## 2014-12-31 DIAGNOSIS — I272 Other secondary pulmonary hypertension: Secondary | ICD-10-CM | POA: Diagnosis not present

## 2015-01-01 DIAGNOSIS — D571 Sickle-cell disease without crisis: Secondary | ICD-10-CM | POA: Diagnosis not present

## 2015-01-01 DIAGNOSIS — I2609 Other pulmonary embolism with acute cor pulmonale: Secondary | ICD-10-CM | POA: Diagnosis not present

## 2015-01-01 DIAGNOSIS — J449 Chronic obstructive pulmonary disease, unspecified: Secondary | ICD-10-CM | POA: Diagnosis not present

## 2015-01-01 DIAGNOSIS — I272 Other secondary pulmonary hypertension: Secondary | ICD-10-CM | POA: Diagnosis not present

## 2015-01-02 DIAGNOSIS — J449 Chronic obstructive pulmonary disease, unspecified: Secondary | ICD-10-CM | POA: Diagnosis not present

## 2015-01-02 DIAGNOSIS — I272 Other secondary pulmonary hypertension: Secondary | ICD-10-CM | POA: Diagnosis not present

## 2015-01-02 DIAGNOSIS — I2609 Other pulmonary embolism with acute cor pulmonale: Secondary | ICD-10-CM | POA: Diagnosis not present

## 2015-01-02 DIAGNOSIS — D571 Sickle-cell disease without crisis: Secondary | ICD-10-CM | POA: Diagnosis not present

## 2015-01-03 DIAGNOSIS — D571 Sickle-cell disease without crisis: Secondary | ICD-10-CM | POA: Diagnosis not present

## 2015-01-03 DIAGNOSIS — I2609 Other pulmonary embolism with acute cor pulmonale: Secondary | ICD-10-CM | POA: Diagnosis not present

## 2015-01-03 DIAGNOSIS — I272 Other secondary pulmonary hypertension: Secondary | ICD-10-CM | POA: Diagnosis not present

## 2015-01-03 DIAGNOSIS — J449 Chronic obstructive pulmonary disease, unspecified: Secondary | ICD-10-CM | POA: Diagnosis not present

## 2015-01-06 DIAGNOSIS — I2609 Other pulmonary embolism with acute cor pulmonale: Secondary | ICD-10-CM | POA: Diagnosis not present

## 2015-01-06 DIAGNOSIS — D571 Sickle-cell disease without crisis: Secondary | ICD-10-CM | POA: Diagnosis not present

## 2015-01-06 DIAGNOSIS — I272 Other secondary pulmonary hypertension: Secondary | ICD-10-CM | POA: Diagnosis not present

## 2015-01-06 DIAGNOSIS — J449 Chronic obstructive pulmonary disease, unspecified: Secondary | ICD-10-CM | POA: Diagnosis not present

## 2015-01-07 DIAGNOSIS — D571 Sickle-cell disease without crisis: Secondary | ICD-10-CM | POA: Diagnosis not present

## 2015-01-07 DIAGNOSIS — J449 Chronic obstructive pulmonary disease, unspecified: Secondary | ICD-10-CM | POA: Diagnosis not present

## 2015-01-07 DIAGNOSIS — I272 Other secondary pulmonary hypertension: Secondary | ICD-10-CM | POA: Diagnosis not present

## 2015-01-07 DIAGNOSIS — I2609 Other pulmonary embolism with acute cor pulmonale: Secondary | ICD-10-CM | POA: Diagnosis not present

## 2015-01-08 DIAGNOSIS — D571 Sickle-cell disease without crisis: Secondary | ICD-10-CM | POA: Diagnosis not present

## 2015-01-08 DIAGNOSIS — I272 Other secondary pulmonary hypertension: Secondary | ICD-10-CM | POA: Diagnosis not present

## 2015-01-08 DIAGNOSIS — J449 Chronic obstructive pulmonary disease, unspecified: Secondary | ICD-10-CM | POA: Diagnosis not present

## 2015-01-08 DIAGNOSIS — I2609 Other pulmonary embolism with acute cor pulmonale: Secondary | ICD-10-CM | POA: Diagnosis not present

## 2015-01-09 DIAGNOSIS — I2609 Other pulmonary embolism with acute cor pulmonale: Secondary | ICD-10-CM | POA: Diagnosis not present

## 2015-01-09 DIAGNOSIS — I272 Other secondary pulmonary hypertension: Secondary | ICD-10-CM | POA: Diagnosis not present

## 2015-01-09 DIAGNOSIS — J449 Chronic obstructive pulmonary disease, unspecified: Secondary | ICD-10-CM | POA: Diagnosis not present

## 2015-01-09 DIAGNOSIS — D571 Sickle-cell disease without crisis: Secondary | ICD-10-CM | POA: Diagnosis not present

## 2015-01-10 DIAGNOSIS — D571 Sickle-cell disease without crisis: Secondary | ICD-10-CM | POA: Diagnosis not present

## 2015-01-10 DIAGNOSIS — J449 Chronic obstructive pulmonary disease, unspecified: Secondary | ICD-10-CM | POA: Diagnosis not present

## 2015-01-10 DIAGNOSIS — I2609 Other pulmonary embolism with acute cor pulmonale: Secondary | ICD-10-CM | POA: Diagnosis not present

## 2015-01-10 DIAGNOSIS — I272 Other secondary pulmonary hypertension: Secondary | ICD-10-CM | POA: Diagnosis not present

## 2015-01-13 DIAGNOSIS — I2609 Other pulmonary embolism with acute cor pulmonale: Secondary | ICD-10-CM | POA: Diagnosis not present

## 2015-01-13 DIAGNOSIS — I272 Other secondary pulmonary hypertension: Secondary | ICD-10-CM | POA: Diagnosis not present

## 2015-01-13 DIAGNOSIS — J449 Chronic obstructive pulmonary disease, unspecified: Secondary | ICD-10-CM | POA: Diagnosis not present

## 2015-01-13 DIAGNOSIS — D571 Sickle-cell disease without crisis: Secondary | ICD-10-CM | POA: Diagnosis not present

## 2015-01-14 DIAGNOSIS — J449 Chronic obstructive pulmonary disease, unspecified: Secondary | ICD-10-CM | POA: Diagnosis not present

## 2015-01-14 DIAGNOSIS — I272 Other secondary pulmonary hypertension: Secondary | ICD-10-CM | POA: Diagnosis not present

## 2015-01-14 DIAGNOSIS — D571 Sickle-cell disease without crisis: Secondary | ICD-10-CM | POA: Diagnosis not present

## 2015-01-14 DIAGNOSIS — I2609 Other pulmonary embolism with acute cor pulmonale: Secondary | ICD-10-CM | POA: Diagnosis not present

## 2015-01-15 DIAGNOSIS — J449 Chronic obstructive pulmonary disease, unspecified: Secondary | ICD-10-CM | POA: Diagnosis not present

## 2015-01-15 DIAGNOSIS — I2609 Other pulmonary embolism with acute cor pulmonale: Secondary | ICD-10-CM | POA: Diagnosis not present

## 2015-01-15 DIAGNOSIS — D571 Sickle-cell disease without crisis: Secondary | ICD-10-CM | POA: Diagnosis not present

## 2015-01-15 DIAGNOSIS — I272 Other secondary pulmonary hypertension: Secondary | ICD-10-CM | POA: Diagnosis not present

## 2015-01-16 DIAGNOSIS — J449 Chronic obstructive pulmonary disease, unspecified: Secondary | ICD-10-CM | POA: Diagnosis not present

## 2015-01-16 DIAGNOSIS — D571 Sickle-cell disease without crisis: Secondary | ICD-10-CM | POA: Diagnosis not present

## 2015-01-16 DIAGNOSIS — I2609 Other pulmonary embolism with acute cor pulmonale: Secondary | ICD-10-CM | POA: Diagnosis not present

## 2015-01-16 DIAGNOSIS — I272 Other secondary pulmonary hypertension: Secondary | ICD-10-CM | POA: Diagnosis not present

## 2015-01-17 DIAGNOSIS — I2609 Other pulmonary embolism with acute cor pulmonale: Secondary | ICD-10-CM | POA: Diagnosis not present

## 2015-01-17 DIAGNOSIS — J449 Chronic obstructive pulmonary disease, unspecified: Secondary | ICD-10-CM | POA: Diagnosis not present

## 2015-01-17 DIAGNOSIS — D571 Sickle-cell disease without crisis: Secondary | ICD-10-CM | POA: Diagnosis not present

## 2015-01-17 DIAGNOSIS — I272 Other secondary pulmonary hypertension: Secondary | ICD-10-CM | POA: Diagnosis not present

## 2015-01-20 DIAGNOSIS — J449 Chronic obstructive pulmonary disease, unspecified: Secondary | ICD-10-CM | POA: Diagnosis not present

## 2015-01-20 DIAGNOSIS — I272 Other secondary pulmonary hypertension: Secondary | ICD-10-CM | POA: Diagnosis not present

## 2015-01-20 DIAGNOSIS — I2609 Other pulmonary embolism with acute cor pulmonale: Secondary | ICD-10-CM | POA: Diagnosis not present

## 2015-01-20 DIAGNOSIS — D571 Sickle-cell disease without crisis: Secondary | ICD-10-CM | POA: Diagnosis not present

## 2015-01-21 DIAGNOSIS — I272 Other secondary pulmonary hypertension: Secondary | ICD-10-CM | POA: Diagnosis not present

## 2015-01-21 DIAGNOSIS — I2609 Other pulmonary embolism with acute cor pulmonale: Secondary | ICD-10-CM | POA: Diagnosis not present

## 2015-01-21 DIAGNOSIS — D571 Sickle-cell disease without crisis: Secondary | ICD-10-CM | POA: Diagnosis not present

## 2015-01-21 DIAGNOSIS — J449 Chronic obstructive pulmonary disease, unspecified: Secondary | ICD-10-CM | POA: Diagnosis not present

## 2015-01-22 DIAGNOSIS — I272 Other secondary pulmonary hypertension: Secondary | ICD-10-CM | POA: Diagnosis not present

## 2015-01-22 DIAGNOSIS — I2609 Other pulmonary embolism with acute cor pulmonale: Secondary | ICD-10-CM | POA: Diagnosis not present

## 2015-01-22 DIAGNOSIS — D571 Sickle-cell disease without crisis: Secondary | ICD-10-CM | POA: Diagnosis not present

## 2015-01-22 DIAGNOSIS — J449 Chronic obstructive pulmonary disease, unspecified: Secondary | ICD-10-CM | POA: Diagnosis not present

## 2015-01-23 DIAGNOSIS — I2609 Other pulmonary embolism with acute cor pulmonale: Secondary | ICD-10-CM | POA: Diagnosis not present

## 2015-01-23 DIAGNOSIS — I272 Other secondary pulmonary hypertension: Secondary | ICD-10-CM | POA: Diagnosis not present

## 2015-01-23 DIAGNOSIS — D571 Sickle-cell disease without crisis: Secondary | ICD-10-CM | POA: Diagnosis not present

## 2015-01-23 DIAGNOSIS — J449 Chronic obstructive pulmonary disease, unspecified: Secondary | ICD-10-CM | POA: Diagnosis not present

## 2015-01-24 DIAGNOSIS — D571 Sickle-cell disease without crisis: Secondary | ICD-10-CM | POA: Diagnosis not present

## 2015-01-24 DIAGNOSIS — I272 Other secondary pulmonary hypertension: Secondary | ICD-10-CM | POA: Diagnosis not present

## 2015-01-24 DIAGNOSIS — J449 Chronic obstructive pulmonary disease, unspecified: Secondary | ICD-10-CM | POA: Diagnosis not present

## 2015-01-24 DIAGNOSIS — I2609 Other pulmonary embolism with acute cor pulmonale: Secondary | ICD-10-CM | POA: Diagnosis not present

## 2015-01-27 DIAGNOSIS — J449 Chronic obstructive pulmonary disease, unspecified: Secondary | ICD-10-CM | POA: Diagnosis not present

## 2015-01-27 DIAGNOSIS — I2609 Other pulmonary embolism with acute cor pulmonale: Secondary | ICD-10-CM | POA: Diagnosis not present

## 2015-01-27 DIAGNOSIS — D571 Sickle-cell disease without crisis: Secondary | ICD-10-CM | POA: Diagnosis not present

## 2015-01-27 DIAGNOSIS — I272 Other secondary pulmonary hypertension: Secondary | ICD-10-CM | POA: Diagnosis not present

## 2015-01-28 DIAGNOSIS — D571 Sickle-cell disease without crisis: Secondary | ICD-10-CM | POA: Diagnosis not present

## 2015-01-28 DIAGNOSIS — I272 Other secondary pulmonary hypertension: Secondary | ICD-10-CM | POA: Diagnosis not present

## 2015-01-28 DIAGNOSIS — I2609 Other pulmonary embolism with acute cor pulmonale: Secondary | ICD-10-CM | POA: Diagnosis not present

## 2015-01-28 DIAGNOSIS — J449 Chronic obstructive pulmonary disease, unspecified: Secondary | ICD-10-CM | POA: Diagnosis not present

## 2015-01-29 DIAGNOSIS — I272 Other secondary pulmonary hypertension: Secondary | ICD-10-CM | POA: Diagnosis not present

## 2015-01-29 DIAGNOSIS — J449 Chronic obstructive pulmonary disease, unspecified: Secondary | ICD-10-CM | POA: Diagnosis not present

## 2015-01-29 DIAGNOSIS — D571 Sickle-cell disease without crisis: Secondary | ICD-10-CM | POA: Diagnosis not present

## 2015-01-29 DIAGNOSIS — I2609 Other pulmonary embolism with acute cor pulmonale: Secondary | ICD-10-CM | POA: Diagnosis not present

## 2015-01-30 DIAGNOSIS — I2609 Other pulmonary embolism with acute cor pulmonale: Secondary | ICD-10-CM | POA: Diagnosis not present

## 2015-01-30 DIAGNOSIS — J449 Chronic obstructive pulmonary disease, unspecified: Secondary | ICD-10-CM | POA: Diagnosis not present

## 2015-01-30 DIAGNOSIS — D571 Sickle-cell disease without crisis: Secondary | ICD-10-CM | POA: Diagnosis not present

## 2015-01-30 DIAGNOSIS — I272 Other secondary pulmonary hypertension: Secondary | ICD-10-CM | POA: Diagnosis not present

## 2015-01-31 DIAGNOSIS — D571 Sickle-cell disease without crisis: Secondary | ICD-10-CM | POA: Diagnosis not present

## 2015-01-31 DIAGNOSIS — I272 Other secondary pulmonary hypertension: Secondary | ICD-10-CM | POA: Diagnosis not present

## 2015-01-31 DIAGNOSIS — I2609 Other pulmonary embolism with acute cor pulmonale: Secondary | ICD-10-CM | POA: Diagnosis not present

## 2015-01-31 DIAGNOSIS — J449 Chronic obstructive pulmonary disease, unspecified: Secondary | ICD-10-CM | POA: Diagnosis not present

## 2015-02-03 DIAGNOSIS — D571 Sickle-cell disease without crisis: Secondary | ICD-10-CM | POA: Diagnosis not present

## 2015-02-03 DIAGNOSIS — I2609 Other pulmonary embolism with acute cor pulmonale: Secondary | ICD-10-CM | POA: Diagnosis not present

## 2015-02-03 DIAGNOSIS — J449 Chronic obstructive pulmonary disease, unspecified: Secondary | ICD-10-CM | POA: Diagnosis not present

## 2015-02-03 DIAGNOSIS — I272 Other secondary pulmonary hypertension: Secondary | ICD-10-CM | POA: Diagnosis not present

## 2015-02-04 DIAGNOSIS — J449 Chronic obstructive pulmonary disease, unspecified: Secondary | ICD-10-CM | POA: Diagnosis not present

## 2015-02-04 DIAGNOSIS — I2609 Other pulmonary embolism with acute cor pulmonale: Secondary | ICD-10-CM | POA: Diagnosis not present

## 2015-02-04 DIAGNOSIS — D571 Sickle-cell disease without crisis: Secondary | ICD-10-CM | POA: Diagnosis not present

## 2015-02-04 DIAGNOSIS — I272 Other secondary pulmonary hypertension: Secondary | ICD-10-CM | POA: Diagnosis not present

## 2015-02-05 DIAGNOSIS — D571 Sickle-cell disease without crisis: Secondary | ICD-10-CM | POA: Diagnosis not present

## 2015-02-05 DIAGNOSIS — I2609 Other pulmonary embolism with acute cor pulmonale: Secondary | ICD-10-CM | POA: Diagnosis not present

## 2015-02-05 DIAGNOSIS — I272 Other secondary pulmonary hypertension: Secondary | ICD-10-CM | POA: Diagnosis not present

## 2015-02-05 DIAGNOSIS — J449 Chronic obstructive pulmonary disease, unspecified: Secondary | ICD-10-CM | POA: Diagnosis not present

## 2015-02-06 DIAGNOSIS — D571 Sickle-cell disease without crisis: Secondary | ICD-10-CM | POA: Diagnosis not present

## 2015-02-06 DIAGNOSIS — I272 Other secondary pulmonary hypertension: Secondary | ICD-10-CM | POA: Diagnosis not present

## 2015-02-06 DIAGNOSIS — J449 Chronic obstructive pulmonary disease, unspecified: Secondary | ICD-10-CM | POA: Diagnosis not present

## 2015-02-06 DIAGNOSIS — I2609 Other pulmonary embolism with acute cor pulmonale: Secondary | ICD-10-CM | POA: Diagnosis not present

## 2015-02-07 ENCOUNTER — Inpatient Hospital Stay (HOSPITAL_COMMUNITY): Payer: Medicare Other

## 2015-02-07 ENCOUNTER — Encounter (HOSPITAL_COMMUNITY): Payer: Self-pay | Admitting: Emergency Medicine

## 2015-02-07 ENCOUNTER — Emergency Department (HOSPITAL_COMMUNITY): Payer: Medicare Other

## 2015-02-07 ENCOUNTER — Inpatient Hospital Stay (HOSPITAL_COMMUNITY)
Admission: EM | Admit: 2015-02-07 | Discharge: 2015-02-16 | DRG: 190 | Disposition: A | Discharge status: Hospice | Payer: Medicare Other | Attending: Internal Medicine | Admitting: Internal Medicine

## 2015-02-07 DIAGNOSIS — Z66 Do not resuscitate: Secondary | ICD-10-CM | POA: Diagnosis not present

## 2015-02-07 DIAGNOSIS — J9601 Acute respiratory failure with hypoxia: Secondary | ICD-10-CM | POA: Diagnosis not present

## 2015-02-07 DIAGNOSIS — Z86718 Personal history of other venous thrombosis and embolism: Secondary | ICD-10-CM

## 2015-02-07 DIAGNOSIS — R911 Solitary pulmonary nodule: Secondary | ICD-10-CM | POA: Diagnosis not present

## 2015-02-07 DIAGNOSIS — E43 Unspecified severe protein-calorie malnutrition: Secondary | ICD-10-CM | POA: Diagnosis present

## 2015-02-07 DIAGNOSIS — J441 Chronic obstructive pulmonary disease with (acute) exacerbation: Secondary | ICD-10-CM | POA: Diagnosis not present

## 2015-02-07 DIAGNOSIS — J9600 Acute respiratory failure, unspecified whether with hypoxia or hypercapnia: Secondary | ICD-10-CM | POA: Diagnosis not present

## 2015-02-07 DIAGNOSIS — Z9981 Dependence on supplemental oxygen: Secondary | ICD-10-CM | POA: Diagnosis not present

## 2015-02-07 DIAGNOSIS — R069 Unspecified abnormalities of breathing: Secondary | ICD-10-CM | POA: Diagnosis not present

## 2015-02-07 DIAGNOSIS — Z86711 Personal history of pulmonary embolism: Secondary | ICD-10-CM

## 2015-02-07 DIAGNOSIS — R0602 Shortness of breath: Secondary | ICD-10-CM | POA: Diagnosis not present

## 2015-02-07 DIAGNOSIS — J9622 Acute and chronic respiratory failure with hypercapnia: Secondary | ICD-10-CM | POA: Diagnosis present

## 2015-02-07 DIAGNOSIS — R140 Abdominal distension (gaseous): Secondary | ICD-10-CM | POA: Diagnosis present

## 2015-02-07 DIAGNOSIS — J9621 Acute and chronic respiratory failure with hypoxia: Secondary | ICD-10-CM | POA: Diagnosis present

## 2015-02-07 DIAGNOSIS — J449 Chronic obstructive pulmonary disease, unspecified: Secondary | ICD-10-CM | POA: Diagnosis not present

## 2015-02-07 DIAGNOSIS — Z681 Body mass index (BMI) 19 or less, adult: Secondary | ICD-10-CM

## 2015-02-07 DIAGNOSIS — J439 Emphysema, unspecified: Secondary | ICD-10-CM | POA: Diagnosis not present

## 2015-02-07 DIAGNOSIS — Z79899 Other long term (current) drug therapy: Secondary | ICD-10-CM | POA: Diagnosis not present

## 2015-02-07 DIAGNOSIS — R000 Tachycardia, unspecified: Secondary | ICD-10-CM | POA: Diagnosis not present

## 2015-02-07 DIAGNOSIS — E870 Hyperosmolality and hypernatremia: Secondary | ICD-10-CM | POA: Diagnosis not present

## 2015-02-07 DIAGNOSIS — Z87891 Personal history of nicotine dependence: Secondary | ICD-10-CM

## 2015-02-07 DIAGNOSIS — D571 Sickle-cell disease without crisis: Secondary | ICD-10-CM | POA: Diagnosis present

## 2015-02-07 DIAGNOSIS — Z7952 Long term (current) use of systemic steroids: Secondary | ICD-10-CM | POA: Diagnosis not present

## 2015-02-07 DIAGNOSIS — D649 Anemia, unspecified: Secondary | ICD-10-CM | POA: Diagnosis not present

## 2015-02-07 DIAGNOSIS — I272 Other secondary pulmonary hypertension: Secondary | ICD-10-CM | POA: Diagnosis not present

## 2015-02-07 DIAGNOSIS — J9602 Acute respiratory failure with hypercapnia: Secondary | ICD-10-CM

## 2015-02-07 DIAGNOSIS — J9811 Atelectasis: Secondary | ICD-10-CM | POA: Diagnosis not present

## 2015-02-07 DIAGNOSIS — E876 Hypokalemia: Secondary | ICD-10-CM | POA: Diagnosis present

## 2015-02-07 DIAGNOSIS — E875 Hyperkalemia: Secondary | ICD-10-CM | POA: Diagnosis present

## 2015-02-07 DIAGNOSIS — J984 Other disorders of lung: Secondary | ICD-10-CM | POA: Diagnosis not present

## 2015-02-07 DIAGNOSIS — R4182 Altered mental status, unspecified: Secondary | ICD-10-CM

## 2015-02-07 DIAGNOSIS — Z515 Encounter for palliative care: Secondary | ICD-10-CM | POA: Diagnosis not present

## 2015-02-07 DIAGNOSIS — I2609 Other pulmonary embolism with acute cor pulmonale: Secondary | ICD-10-CM | POA: Diagnosis not present

## 2015-02-07 DIAGNOSIS — R0600 Dyspnea, unspecified: Secondary | ICD-10-CM | POA: Diagnosis not present

## 2015-02-07 LAB — BASIC METABOLIC PANEL
Anion gap: 9 (ref 5–15)
BUN: 15 mg/dL (ref 6–23)
CALCIUM: 9.2 mg/dL (ref 8.4–10.5)
CO2: 41 mmol/L (ref 19–32)
CREATININE: 1.43 mg/dL — AB (ref 0.50–1.35)
Chloride: 90 mmol/L — ABNORMAL LOW (ref 96–112)
GFR calc Af Amer: 53 mL/min — ABNORMAL LOW (ref >90)
GFR, EST NON AFRICAN AMERICAN: 46 mL/min — AB (ref >90)
Glucose, Bld: 142 mg/dL — ABNORMAL HIGH (ref 70–99)
Potassium: 3.3 mmol/L — ABNORMAL LOW (ref 3.5–5.1)
Sodium: 140 mmol/L (ref 135–145)

## 2015-02-07 LAB — CBC
HEMATOCRIT: 26.6 % — AB (ref 39.0–52.0)
HEMOGLOBIN: 9 g/dL — AB (ref 13.0–17.0)
MCH: 30.5 pg (ref 26.0–34.0)
MCHC: 33.8 g/dL (ref 30.0–36.0)
MCV: 90.2 fL (ref 78.0–100.0)
PLATELETS: 168 10*3/uL (ref 150–400)
RBC: 2.95 MIL/uL — AB (ref 4.22–5.81)
RDW: 20.5 % — ABNORMAL HIGH (ref 11.5–15.5)
WBC: 7.2 10*3/uL (ref 4.0–10.5)

## 2015-02-07 LAB — BLOOD GAS, ARTERIAL
Acid-Base Excess: 16.2 mmol/L — ABNORMAL HIGH (ref 0.0–2.0)
Acid-Base Excess: 13.5 mmol/L — ABNORMAL HIGH (ref 0.0–2.0)
Bicarbonate: 42.4 mEq/L — ABNORMAL HIGH (ref 20.0–24.0)
Bicarbonate: 40.4 mEq/L — ABNORMAL HIGH (ref 20.0–24.0)
DRAWN BY: 235321
Delivery systems: POSITIVE
EXPIRATORY PAP: 6
FIO2: 0.31 %
FIO2: 0.35 %
INSPIRATORY PAP: 12
MODE: POSITIVE
O2 SAT: 77.9 %
O2 Saturation: 87 %
PATIENT TEMPERATURE: 98.6
PATIENT TEMPERATURE: 98.6
PCO2 ART: 61.8 mmHg — AB (ref 35.0–45.0)
PCO2 ART: 70 mmHg — AB (ref 35.0–45.0)
PH ART: 7.38 (ref 7.350–7.450)
TCO2: 39.5 mmol/L (ref 0–100)
TCO2: 38.3 mmol/L (ref 0–100)
pH, Arterial: 7.451 — ABNORMAL HIGH (ref 7.350–7.450)
pO2, Arterial: 42.5 mmHg — ABNORMAL LOW (ref 80.0–100.0)
pO2, Arterial: 57.7 mmHg — ABNORMAL LOW (ref 80.0–100.0)

## 2015-02-07 LAB — I-STAT TROPONIN, ED: Troponin i, poc: 0.02 ng/mL (ref 0.00–0.08)

## 2015-02-07 MED ORDER — METHYLPREDNISOLONE SODIUM SUCC 125 MG IJ SOLR
125.0000 mg | Freq: Once | INTRAMUSCULAR | Status: AC
  Administered 2015-02-07: 125 mg via INTRAVENOUS
  Filled 2015-02-07: qty 2

## 2015-02-07 MED ORDER — BUDESONIDE-FORMOTEROL FUMARATE 160-4.5 MCG/ACT IN AERO
2.0000 | INHALATION_SPRAY | Freq: Two times a day (BID) | RESPIRATORY_TRACT | Status: DC
  Administered 2015-02-08 – 2015-02-11 (×6): 2 via RESPIRATORY_TRACT
  Filled 2015-02-07: qty 6

## 2015-02-07 MED ORDER — LORAZEPAM 2 MG/ML IJ SOLN
1.0000 mg | Freq: Once | INTRAMUSCULAR | Status: AC
  Administered 2015-02-07: 1 mg via INTRAVENOUS
  Filled 2015-02-07: qty 1

## 2015-02-07 MED ORDER — ONDANSETRON HCL 4 MG/2ML IJ SOLN: 4.0000 mg | Freq: Four times a day (QID) | INTRAMUSCULAR | Status: DC | PRN

## 2015-02-07 MED ORDER — FERROUS SULFATE 325 (65 FE) MG PO TABS
325.0000 mg | ORAL_TABLET | Freq: Two times a day (BID) | ORAL | Status: DC
  Administered 2015-02-08 – 2015-02-16 (×13): 325 mg via ORAL
  Filled 2015-02-07 (×17): qty 1

## 2015-02-07 MED ORDER — ALBUTEROL (5 MG/ML) CONTINUOUS INHALATION SOLN
10.0000 mg/h | INHALATION_SOLUTION | RESPIRATORY_TRACT | Status: DC
  Administered 2015-02-07: 10 mg/h via RESPIRATORY_TRACT
  Filled 2015-02-07: qty 20

## 2015-02-07 MED ORDER — FOLIC ACID 0.5 MG HALF TAB
500.0000 ug | ORAL_TABLET | Freq: Every day | ORAL | Status: DC
  Administered 2015-02-08 – 2015-02-16 (×8): 0.5 mg via ORAL
  Filled 2015-02-07 (×9): qty 1

## 2015-02-07 MED ORDER — ALBUTEROL SULFATE (2.5 MG/3ML) 0.083% IN NEBU
2.5000 mg | INHALATION_SOLUTION | RESPIRATORY_TRACT | Status: DC | PRN
  Administered 2015-02-09 (×2): 2.5 mg via RESPIRATORY_TRACT
  Filled 2015-02-07 (×3): qty 3

## 2015-02-07 MED ORDER — GUAIFENESIN ER 600 MG PO TB12
600.0000 mg | ORAL_TABLET | Freq: Two times a day (BID) | ORAL | Status: DC
  Administered 2015-02-08 – 2015-02-16 (×16): 600 mg via ORAL
  Filled 2015-02-07 (×18): qty 1

## 2015-02-07 MED ORDER — LORAZEPAM 0.5 MG PO TABS
0.5000 mg | ORAL_TABLET | Freq: Four times a day (QID) | ORAL | Status: DC | PRN
  Administered 2015-02-09: 0.5 mg via ORAL
  Filled 2015-02-07: qty 1

## 2015-02-07 MED ORDER — IPRATROPIUM BROMIDE 0.02 % IN SOLN
1.0000 mg | Freq: Once | RESPIRATORY_TRACT | Status: AC
  Administered 2015-02-07: 1 mg via RESPIRATORY_TRACT
  Filled 2015-02-07: qty 5

## 2015-02-07 MED ORDER — DOCUSATE SODIUM 100 MG PO CAPS
100.0000 mg | ORAL_CAPSULE | Freq: Two times a day (BID) | ORAL | Status: DC
  Administered 2015-02-08 – 2015-02-16 (×11): 100 mg via ORAL
  Filled 2015-02-07 (×14): qty 1

## 2015-02-07 MED ORDER — IOHEXOL 350 MG/ML SOLN
80.0000 mL | Freq: Once | INTRAVENOUS | Status: AC | PRN
  Administered 2015-02-07: 80 mL via INTRAVENOUS

## 2015-02-07 MED ORDER — ENOXAPARIN SODIUM 40 MG/0.4ML ~~LOC~~ SOLN
40.0000 mg | SUBCUTANEOUS | Status: DC
  Administered 2015-02-08 – 2015-02-16 (×8): 40 mg via SUBCUTANEOUS
  Filled 2015-02-07 (×8): qty 0.4

## 2015-02-07 MED ORDER — GLUCERNA SHAKE PO LIQD
237.0000 mL | Freq: Three times a day (TID) | ORAL | Status: DC
  Administered 2015-02-08 – 2015-02-16 (×23): 237 mL via ORAL
  Filled 2015-02-07 (×35): qty 237

## 2015-02-07 MED ORDER — ACETAMINOPHEN 650 MG RE SUPP
650.0000 mg | Freq: Four times a day (QID) | RECTAL | Status: DC | PRN
Start: 2015-02-07 — End: 2015-02-16

## 2015-02-07 MED ORDER — FLEET ENEMA 7-19 GM/118ML RE ENEM: 1.0000 | ENEMA | Freq: Every day | RECTAL | Status: DC | PRN

## 2015-02-07 MED ORDER — IPRATROPIUM BROMIDE 0.02 % IN SOLN: 1.0000 mg | Freq: Four times a day (QID) | RESPIRATORY_TRACT | Status: DC

## 2015-02-07 MED ORDER — SENNA 8.6 MG PO TABS
2.0000 | ORAL_TABLET | Freq: Every day | ORAL | Status: DC
  Administered 2015-02-08 – 2015-02-15 (×4): 17.2 mg via ORAL
  Filled 2015-02-07 (×5): qty 2

## 2015-02-07 MED ORDER — POLYETHYLENE GLYCOL 3350 17 G PO PACK: 17.0000 g | PACK | Freq: Every day | ORAL | Status: DC | PRN

## 2015-02-07 MED ORDER — SODIUM CHLORIDE 0.9 % IV BOLUS (SEPSIS)
500.0000 mL | Freq: Once | INTRAVENOUS | Status: AC
  Administered 2015-02-07: 500 mL via INTRAVENOUS

## 2015-02-07 MED ORDER — LORATADINE 10 MG PO TABS
10.0000 mg | ORAL_TABLET | Freq: Every day | ORAL | Status: DC
  Administered 2015-02-08 – 2015-02-16 (×8): 10 mg via ORAL
  Filled 2015-02-07 (×8): qty 1

## 2015-02-07 MED ORDER — ACETAMINOPHEN 325 MG PO TABS: 650.0000 mg | ORAL_TABLET | Freq: Four times a day (QID) | ORAL | Status: DC | PRN

## 2015-02-07 MED ORDER — SODIUM CHLORIDE 0.9 % IJ SOLN
3.0000 mL | Freq: Two times a day (BID) | INTRAMUSCULAR | Status: DC
  Administered 2015-02-08 – 2015-02-15 (×10): 3 mL via INTRAVENOUS

## 2015-02-07 MED ORDER — METHYLPREDNISOLONE SODIUM SUCC 125 MG IJ SOLR: 60.0000 mg | Freq: Two times a day (BID) | INTRAMUSCULAR | Status: DC

## 2015-02-07 MED ORDER — POTASSIUM CHLORIDE CRYS ER 20 MEQ PO TBCR
20.0000 meq | EXTENDED_RELEASE_TABLET | Freq: Once | ORAL | Status: AC
  Administered 2015-02-08: 20 meq via ORAL
  Filled 2015-02-07: qty 1

## 2015-02-07 MED ORDER — DOXYCYCLINE HYCLATE 100 MG PO TABS
100.0000 mg | ORAL_TABLET | Freq: Two times a day (BID) | ORAL | Status: DC
Start: 2015-02-07 — End: 2015-02-16
  Administered 2015-02-08 – 2015-02-16 (×16): 100 mg via ORAL
  Filled 2015-02-07 (×19): qty 1

## 2015-02-07 MED ORDER — SODIUM CHLORIDE 0.9 % IV SOLN
INTRAVENOUS | Status: AC
  Administered 2015-02-08: via INTRAVENOUS

## 2015-02-07 MED ORDER — HYDROCODONE-ACETAMINOPHEN 5-325 MG PO TABS: 1.0000 | ORAL_TABLET | ORAL | Status: DC | PRN

## 2015-02-07 MED ORDER — BISACODYL 10 MG RE SUPP: 10.0000 mg | Freq: Every day | RECTAL | Status: DC | PRN

## 2015-02-07 MED ORDER — IPRATROPIUM-ALBUTEROL 0.5-2.5 (3) MG/3ML IN SOLN
3.0000 mL | Freq: Four times a day (QID) | RESPIRATORY_TRACT | Status: DC
  Administered 2015-02-08 – 2015-02-11 (×12): 3 mL via RESPIRATORY_TRACT
  Filled 2015-02-07 (×12): qty 3

## 2015-02-07 MED ORDER — LORAZEPAM 1 MG PO TABS: 1.0000 mg | ORAL_TABLET | Freq: Once | ORAL | Status: DC

## 2015-02-07 MED ORDER — GUAIFENESIN-DM 100-10 MG/5ML PO SYRP: 5.0000 mL | ORAL_SOLUTION | ORAL | Status: DC | PRN

## 2015-02-07 MED ORDER — PANTOPRAZOLE SODIUM 40 MG PO TBEC
40.0000 mg | DELAYED_RELEASE_TABLET | Freq: Every day | ORAL | Status: DC
  Administered 2015-02-08 – 2015-02-15 (×7): 40 mg via ORAL
  Filled 2015-02-07 (×8): qty 1

## 2015-02-07 MED ORDER — MORPHINE SULFATE 15 MG PO TABS: 15.0000 mg | ORAL_TABLET | ORAL | Status: DC | PRN

## 2015-02-07 MED ORDER — ONDANSETRON HCL 4 MG PO TABS: 4.0000 mg | ORAL_TABLET | Freq: Four times a day (QID) | ORAL | Status: DC | PRN

## 2015-02-07 MED ORDER — OSELTAMIVIR PHOSPHATE 75 MG PO CAPS
75.0000 mg | ORAL_CAPSULE | Freq: Two times a day (BID) | ORAL | Status: DC
  Administered 2015-02-08 (×2): 75 mg via ORAL
  Filled 2015-02-07 (×3): qty 1

## 2015-02-07 NOTE — ED Notes (Signed)
[place] RN call at pt discharge.

## 2015-02-07 NOTE — ED Notes (Signed)
Attempted to call report to floor with no answer.

## 2015-02-07 NOTE — H&P (Addendum)
PCP:  Philis Fendt, MD  Pulmonary Dr. Gwenette Greet last seen 2010  Chief Complaint: Shortness of breath  HPI: Anthony [name] is a 79 y.o. male   has a past medical history of COPD (chronic obstructive pulmonary disease); Sickle cell anemia; and Pulmonary embolism.   Presented with  Patient has chronic history of COPD at baseline on 3 L of oxygen. Patient lives at home alone but has nurse come out regularly. Reports fever and cough for few days. Reportedly was sent in by hospice nurse for shortness of breath for past 3 days as well as productive cough of yellow sputum. Nebulizer treatments were given at home without any improvement. Patient was started on BiPAP on arrival by EMS. ABG was obtained showing pH 7.451/61.8/42.5.  Repeat ABG after being on BiPAP showed pH 7.380/70/57.7 Patient in emergency department had CT angiogram of chest due to prior history of PE that showed 1.4 cm spiculated nodule over left lower lobe no evidence of pulmonary embolism. While in emergency department patient was given Solu-Medrol 125 mg IV as well as albuterol nebulizer and Atrovent nebulizer, Ativan 1 mg IV and bolus 500 mL  Hospitalist was called for admission for COPD exacerbation  Review of Systems:    Pertinent positives include:  shortness of breath at rest. dyspnea on exertion, productive cough, wheezing. Constitutional:  No weight loss, night sweats, Fevers, chills, fatigue, weight loss  HEENT:  No headaches, Difficulty swallowing,Tooth/dental problems,Sore throat,  No sneezing, itching, ear ache, nasal congestion, post nasal drip,  Cardio-vascular:  No chest pain, Orthopnea, PND, anasarca, dizziness, palpitations.no Bilateral lower extremity swelling  GI:  No heartburn, indigestion, abdominal pain, nausea, vomiting, diarrhea, change in bowel habits, loss of appetite, melena, blood in stool, hematemesis Resp:  no  NoNo excess mucus, no , No non-productive cough, No coughing up of blood.No  change in color of mucus.No  Skin:  no rash or lesions. No jaundice GU:  no dysuria, change in color of urine, no urgency or frequency. No straining to urinate.  No flank pain.  Musculoskeletal:  No joint pain or no joint swelling. No decreased range of motion. No back pain.  Psych:  No change in mood or affect. No depression or anxiety. No memory loss.  Neuro: no localizing neurological complaints, no tingling, no weakness, no double vision, no gait abnormality, no slurred speech, no confusion  Otherwise ROS are negative except for above, 10 systems were reviewed  Past Medical History: Past Medical History  Diagnosis Date  . COPD (chronic obstructive pulmonary disease)   . Sickle cell anemia   . Pulmonary embolism    Past Surgical History  Procedure Laterality Date  . No past surgeries    . Colonoscopy N/A 03/09/2013    Procedure: COLONOSCOPY;  Surgeon: Beryle Beams, MD;  Location: [facility];  Service: Endoscopy;  Laterality: N/A;  . Esophagogastroduodenoscopy N/A 03/09/2013    Procedure: ESOPHAGOGASTRODUODENOSCOPY (EGD);  Surgeon: Beryle Beams, MD;  Location: [facility] ENDOSCOPY;  Service: Endoscopy;  Laterality: N/A;     Medications: Prior to Admission medications   Medication Sig Start Date End Date Taking? Authorizing Provider  albuterol (PROVENTIL HFA;VENTOLIN HFA) 108 (90 BASE) MCG/ACT inhaler Inhale 2 puffs into the lungs every 6 (six) hours as needed for wheezing or shortness of breath (wheezing and sob).   Yes Historical Provider, MD  albuterol (PROVENTIL) (2.5 MG/3ML) 0.083% nebulizer solution Take 3 mLs (2.5 mg total) by nebulization every 4 (four) hours as needed for wheezing or shortness  of breath. 07/01/14  Yes Janece Canterbury, MD  cetirizine (ZYRTEC) 10 MG tablet Take 10 mg by mouth daily.   Yes Historical Provider, MD  Cholecalciferol (VITAMIN D) 2000 UNITS tablet Take 2,000 Units by mouth daily.   Yes Historical Provider, MD  docusate sodium 100 MG CAPS Take 100 mg  by mouth 2 (two) times daily. 07/01/14  Yes Janece Canterbury, MD  ENSURE (ENSURE) Take 237 mLs by mouth 4 (four) times daily -  with meals and at bedtime. 07/01/14  Yes Janece Canterbury, MD  ferrous sulfate 325 (65 FE) MG tablet Take 325 mg by mouth 2 (two) times daily with a meal. 03/09/13  Yes Sorin June Leap, MD  folic acid (FOLVITE) 502 MCG tablet Take 400 mcg by mouth daily.   Yes Historical Provider, MD  furosemide (LASIX) 20 MG tablet Take 20 mg by mouth daily.   Yes Historical Provider, MD  Ipratropium-Albuterol (COMBIVENT RESPIMAT IN) Inhale 2 puffs into the lungs 3 (three) times daily.   Yes Historical Provider, MD  ipratropium-albuterol (DUONEB) 0.5-2.5 (3) MG/3ML SOLN Take 3 mLs by nebulization 4 (four) times daily -  before meals and at bedtime. 07/01/14  Yes Janece Canterbury, MD  levofloxacin (LEVAQUIN) 750 MG tablet Take 1 tablet (750 mg total) by mouth every other day. 07/02/14  Yes Janece Canterbury, MD  LORazepam (ATIVAN) 0.5 MG tablet Take 0.5 mg by mouth every 6 (six) hours as needed for anxiety.   Yes Historical Provider, MD  magnesium citrate SOLN Take 1 Bottle by mouth once.   Yes Historical Provider, MD  morphine (MSIR) 15 MG tablet Take 1 tablet (15 mg total) by mouth every 4 (four) hours as needed for moderate pain or severe pain (or shortness of breath). 07/01/14  Yes Janece Canterbury, MD  pantoprazole (PROTONIX) 40 MG tablet Take 1 tablet (40 mg total) by mouth daily at 12 noon. 03/09/13  Yes Sorin June Leap, MD  polyethylene glycol (MIRALAX / GLYCOLAX) packet Take 17 g by mouth 2 (two) times daily. 07/01/14  Yes Janece Canterbury, MD  potassium chloride (MICRO-K) 10 MEQ CR capsule Take 10 mEq by mouth daily.    Yes Historical Provider, MD  predniSONE (DELTASONE) 20 MG tablet Take 2 tablets (40 mg total) by mouth daily with breakfast. 07/01/14  Yes Janece Canterbury, MD  senna (SENOKOT) 8.6 MG TABS tablet Take 2 tablets (17.2 mg total) by mouth at bedtime. Patient taking differently: Take 2 tablets  by mouth 2 (two) times daily.  07/01/14  Yes Janece Canterbury, MD  sodium phosphate (FLEET) enema Place 1 enema rectally daily as needed (constipation). follow package directions   Yes Historical Provider, MD  bisacodyl (DULCOLAX) 10 MG suppository Place 1 suppository (10 mg total) rectally daily as needed for moderate constipation. 07/01/14   Janece Canterbury, MD  budesonide-formoterol [facility]) 160-4.5 MCG/ACT inhaler Inhale 2 puffs into the lungs 2 (two) times daily. 07/01/14   Janece Canterbury, MD  fluticasone (FLONASE) 50 MCG/ACT nasal spray Place 2 sprays into the nose daily. Patient not taking: Reported on 02/07/2015 12/30/12   Thurnell Lose, MD  megestrol (MEGACE) 400 MG/10ML suspension Take 10 mLs (400 mg total) by mouth daily. Patient not taking: Reported on 02/07/2015 07/01/14   Janece Canterbury, MD  sodium chloride (OCEAN) 0.65 % SOLN nasal spray Place 1 spray into both nostrils as needed for congestion. Patient not taking: Reported on 02/07/2015 07/01/14   Janece Canterbury, MD  Vitamin D, Ergocalciferol, (DRISDOL) 50000 UNITS CAPS Take 50,000 Units by  mouth every 7 (seven) days. On mondays    Historical Provider, MD    Allergies:  No Known Allergies  Social History: Son Phone number [phone]  Ambulatory   Lives at home alone      reports that he quit smoking about 5 months ago. He has never used smokeless tobacco. He reports that he does not drink alcohol or use illicit drugs.    Family History: family history includes Sickle cell anemia in his mother.    Physical Exam: Patient Vitals for the past 24 hrs:  BP Pulse Resp SpO2  02/07/15 1915 142/77 mmHg (!) 123 20 100 %  02/07/15 1655 - - - 98 %  02/07/15 1645 - (!) 133 17 93 %  02/07/15 1630 - (!) 130 19 93 %  02/07/15 1556 - (!) 130 21 100 %  02/07/15 1548 (!) 157/112 mmHg (!) 130 24 100 %  02/07/15 1547 - - - 100 %    1. General:  in No Acute distress 2. Psychological: Alert and  Oriented 3. Head/ENT:   Moist Mucous  Membranes                          Head Non traumatic, neck supple                          Normal   Dentition 4. SKIN:  decreased Skin turgor,  Skin clean Dry and intact no rash 5. Heart: Regular rate and rhythm no Murmur, Rub or gallop 6. Lungs:some  wheezes no crackles diminished air movement 7. Abdomen: Soft, non-tender,  some distended 8. Lower extremities: no clubbing, cyanosis, or edema 9. Neurologically Grossly intact, moving all 4 extremities equally 10. MSK: Normal range of motion  body mass index is unknown because there is no weight on file.   Labs on Admission:   Results for orders placed or performed during the hospital encounter of 02/07/15 (from the past 24 hour(s))  Basic metabolic panel    (if pt has PMH of COPD)     Status: Abnormal   Collection Time: 02/07/15  4:00 PM  Result Value Ref Range   Sodium 140 135 - 145 mmol/L   Potassium 3.3 (L) 3.5 - 5.1 mmol/L   Chloride 90 (L) 96 - 112 mmol/L   CO2 41 (HH) 19 - 32 mmol/L   Glucose, Bld 142 (H) 70 - 99 mg/dL   BUN 15 6 - 23 mg/dL   Creatinine, Ser 1.43 (H) 0.50 - 1.35 mg/dL   Calcium 9.2 8.4 - 10.5 mg/dL   GFR calc non Af Amer 46 (L) >90 mL/min   GFR calc Af Amer 53 (L) >90 mL/min   Anion gap 9 5 - 15  CBC     (if pt has PMH of COPD)     Status: Abnormal   Collection Time: 02/07/15  4:00 PM  Result Value Ref Range   WBC 7.2 4.0 - 10.5 K/uL   RBC 2.95 (L) 4.22 - 5.81 MIL/uL   Hemoglobin 9.0 (L) 13.0 - 17.0 g/dL   HCT 26.6 (L) 39.0 - 52.0 %   MCV 90.2 78.0 - 100.0 fL   MCH 30.5 26.0 - 34.0 pg   MCHC 33.8 30.0 - 36.0 g/dL   RDW 20.5 (H) 11.5 - 15.5 %   Platelets 168 150 - 400 K/uL  I-stat troponin, ED (if patient has history of COPD)  Status: None   Collection Time: 02/07/15  4:11 PM  Result Value Ref Range   Troponin i, poc 0.02 0.00 - 0.08 ng/mL   Comment 3          Blood gas, arterial     Status: Abnormal   Collection Time: 02/07/15  4:53 PM  Result Value Ref Range   FIO2 0.31 %   Delivery  systems VENTURI MASK    pH, Arterial 7.451 (H) 7.350 - 7.450   pCO2 arterial 61.8 (HH) 35.0 - 45.0 mmHg   pO2, Arterial 42.5 (L) 80.0 - 100.0 mmHg   Bicarbonate 42.4 (H) 20.0 - 24.0 mEq/L   TCO2 39.5 0 - 100 mmol/L   Acid-Base Excess 16.2 (H) 0.0 - 2.0 mmol/L   O2 Saturation 77.9 %   Patient temperature 98.6    Collection site RIGHT RADIAL    Drawn by COLLECTED BY RT    Sample type ARTERIAL DRAW    Allens test (pass/fail) PASS PASS  Blood gas, arterial     Status: Abnormal   Collection Time: 02/07/15  8:08 PM  Result Value Ref Range   FIO2 0.35 %   Delivery systems BILEVEL POSITIVE AIRWAY PRESSURE    Mode BILEVEL POSITIVE AIRWAY PRESSURE    Inspiratory PAP 12.0    Expiratory PAP 6.0    pH, Arterial 7.380 7.350 - 7.450   pCO2 arterial 70.0 (HH) 35.0 - 45.0 mmHg   pO2, Arterial 57.7 (L) 80.0 - 100.0 mmHg   Bicarbonate 40.4 (H) 20.0 - 24.0 mEq/L   TCO2 38.3 0 - 100 mmol/L   Acid-Base Excess 13.5 (H) 0.0 - 2.0 mmol/L   O2 Saturation 87.0 %   Patient temperature 98.6    Collection site RIGHT RADIAL    Drawn by [id number]    Sample type ARTERIAL DRAW    Allens test (pass/fail) PASS PASS    UA no evidence of UTI  Lab Results  Component Value Date   HGBA1C 5.2 06/05/2014    CrCl cannot be calculated (Unknown ideal weight.).  BNP (last 3 results) No results for input(s): PROBNP in the last 8760 hours.  Other results:  I have pearsonaly reviewed this: ECG REPORT  Rate: 135  Rhythm: Sinus tachycardia ST&T Change: nonSpecific EKG changes stable from prior   There were no vitals filed for this visit.   Cultures:    Component Value Date/Time   SDES SPUTUM 12/29/2012 1234   SDES SPUTUM 12/29/2012 1234   SPECREQUEST NONE 12/29/2012 1234   SPECREQUEST NONE 12/29/2012 1234   CULT NORMAL OROPHARYNGEAL FLORA 12/29/2012 1234   REPTSTATUS 12/29/2012 FINAL 12/29/2012 1234   REPTSTATUS 12/31/2012 FINAL 12/29/2012 1234     Radiological Exams on Admission: Ct Angio Chest Pe  W/cm &/or Wo Cm  02/07/2015   CLINICAL DATA:  pt with Hx of COPD and Sickle Cell from home, sent by hospice nurse for SOB x 3 days, productive cough with yellow sputum, hospice nurse administered 3 separate nebulizer treatments without success.  EXAM: CT ANGIOGRAPHY CHEST WITH CONTRAST  TECHNIQUE: Multidetector CT imaging of the chest was performed using the standard protocol during bolus administration of intravenous contrast. Multiplanar CT image reconstructions and MIPs were obtained to evaluate the vascular anatomy.  CONTRAST:  73mL OMNIPAQUE IOHEXOL 350 MG/ML SOLN  COMPARISON:  06/25/2014 and 08/08/2009 as well as CT abdomen 03/01/2013  FINDINGS: Lungs are well inflated and demonstrate moderate diffuse bilateral centriacinar emphysematous disease. Minimal scarring/atelectasis over the posterior lung bases. No focal airspace consolidation  or effusion. There is a enlarging spiculated nodule over the left lower lobe measuring 1.4 cm. Airways are within normal.  Heart is normal size. Pulmonary arterial system is within normal without evidence of emboli. No evidence of mediastinal hilar adenopathy. Remaining mediastinal structures are unremarkable.  Images through the upper abdomen are unchanged. Degenerative change with sclerosis and endplate changes over the thoracic spine which are stable compatible with known sickle cell disease.  Review of the MIP images confirms the above findings.  IMPRESSION: No evidence of pulmonary embolism.  No acute cardiopulmonary disease. Minimal posterior bibasilar scarring/ atelectasis. Severe emphysematous disease.  1.4 cm spiculated nodule over the left lower lobe suspicious for primary malignancy. Recommend PET-CT for further evaluation.   Electronically Signed   By: Marin Olp M.D.   On: 02/07/2015 19:20   Dg Chest Port 1 View  02/07/2015   CLINICAL DATA:  Increase short of breath, COPD. Sickle cell disease.  EXAM: PORTABLE CHEST - 1 VIEW  COMPARISON:  Radiograph 06/24/2014   FINDINGS: Normal cardiac silhouette. There is chronic scarring in atelectasis at the right lung base. Lungs are hyperinflated. No focal infiltrate or pulmonary edema. No pneumothorax.  IMPRESSION: No acute cardiopulmonary findings. No evidence pneumonia. Hyperinflated lungs with right basilar scarring and atelectasis.   Electronically Signed   By: Suzy Bouchard M.D.   On: 02/07/2015 16:27    Chart has been reviewed  Assessment/Plan  79 yo M with history of end-stage COPD currently on hospice here with COPD exacerbation requiring BiPAP support  Present on Admission:  . COPD exacerbation -  - Will initiate Steroid taper, antibiotics, Albuterol PRN, scheduled Atrovent, Dulera and Mucinex. Titrate O2 to saturation >90%. Follow patients respiratory status. We will continue BiPAP overnight. Patient confirmed thathe does not wish to be intubated has also been discussed at length with family. Patient is currently on hospice while at home. He would likely benefit from further support and possible inpatient hospice placement. Given history of fever and cough will obtain influenza PCR and initiate Tamiflu . Anemia - chronic stable . Sickle cell disease - currently stable . Hypokalemia - will replace  . Pulmonary nodule - spoke to her family who at this point wishes to have this pursued. Will obtain pulmonary consult in the morning. Patient likely need CT-guided biopsy to evaluate this further versus close monitoring Abdominal distention will obtain KUB patient has history of constipation evaluate for stool burden  Prophylaxis:   Lovenox, Protonix  CODE STATUS:  DNR/DNI  Other plan as per orders.  I have spent a total of 65 min on this admission case has been discussed with [facility] Carma Leaven 02/07/2015, 8:37 PM  [facility]  Pager [phone]   after 2 AM please page floor coverage PA If 7AM-7PM, please contact the day team taking care of the patient  [email or web address]  Password [email or web address]

## 2015-02-07 NOTE — ED Notes (Signed)
Bed: RESA Expected date:  Expected time:  Means of arrival:  Comments: SOB 

## 2015-02-07 NOTE — ED Notes (Signed)
Please contact Anselm Lis, Hospice RN, on patient's discharge from hospital to set up hospice again.  [phone]

## 2015-02-07 NOTE — ED Notes (Signed)
Per EMS pt with Hx of COPD and Sickle Cell from home, sent by hospice nurse for SOB x 3 days, productive cough with yellow sputum, hospice nurse administered 3 separate nebulizer treatments without success. Per EMS, pt was in tripod sitting possition, lung sounds inaudible. Per EMS, pt was on 15 L/min O2 via non-rebreather on arrival, EMS placed patient on bi-pap and administered additional breathing treatment.

## 2015-02-07 NOTE — Progress Notes (Signed)
EDCM went to speak to patient at bedside, however, patient is asleep on Bipap.  EDCM spoke to ED charge RN who reports patient is a full code and his hospice has stopped because he is in the hospital. [facility] called patient's son Anthony [name] at [phone] who reports he is patient's medical POA.  EDCM asked patient's son if he was a full code?  Anthony [name] stated, "What do you mean?"  EDCM asked Anthony [name] if patient had a DNR do not resuscitate order/wish?  Patient's son reports he is not sure.  EDCM instructed patient's son to bring POA and any other necessary paperwork to hospital.  Patient's son reports he will bring it tomorrow.  EDCM called and spoke to patient's hospice RN Anthony [name] at [phone] who reports she has a copy of patient's DNR, she does not know who has or where the original copy is.  Anthony [name] went on to say if the patient improves while in th hospital she can take him back at home, otherwise patient will need a hospice home.  Anthony [name] reports patient has a CAP aide who stays with the patient, did not specify how long.  EDCM called and discussed patient with Dr. Roel Cluck who reports patient told her he wishes to be a DNR.  No further EDCM needs at this time.

## 2015-02-07 NOTE — Progress Notes (Signed)
Transported patient on V60 BiPAP to the ICU without incident. VSS upon arrival.

## 2015-02-07 NOTE — Progress Notes (Signed)
Transported patient to CT on V60 bipap.

## 2015-02-07 NOTE — ED Notes (Signed)
Patient transported to CT 

## 2015-02-07 NOTE — ED Provider Notes (Signed)
CSN: [id number]     Arrival date & time 02/07/15  1552 History   First MD Initiated Contact with Patient 02/07/15 1557     Chief Complaint  Patient presents with  . Shortness of Breath     (Consider location/radiation/quality/duration/timing/severity/associated sxs/prior Treatment) HPI Comments: Patient presents with 3 days of worsening shortness of breath with cough, not relieved by home nebulizer treatments.  Patient states he has had some fevers as well as chills and body aches.  He denies chest pain.  He has had some abdominal fullness and has not had a bowel movement for 3 days.  Denies leg pain or swelling.  He's a history of PE, though is not currently anticoagulated  Patient is a 79 y.o. male presenting with shortness of breath.  Shortness of Breath Associated symptoms: cough   Associated symptoms: no abdominal pain, no chest pain, no fever, no headaches, no rash and no vomiting     Past Medical History  Diagnosis Date  . COPD (chronic obstructive pulmonary disease)   . Sickle cell anemia   . Pulmonary embolism    Past Surgical History  Procedure Laterality Date  . No past surgeries    . Colonoscopy N/A 03/09/2013    Procedure: COLONOSCOPY;  Surgeon: Beryle Beams, MD;  Location: [place];  Service: Endoscopy;  Laterality: N/A;  . Esophagogastroduodenoscopy N/A 03/09/2013    Procedure: ESOPHAGOGASTRODUODENOSCOPY (EGD);  Surgeon: Beryle Beams, MD;  Location: [facility] ENDOSCOPY;  Service: Endoscopy;  Laterality: N/A;   Family History  Problem Relation Age of Onset  . Sickle cell anemia Mother    History  Substance Use Topics  . Smoking status: Former Smoker    Quit date: 09/08/2014  . Smokeless tobacco: Never Used  . Alcohol Use: No    Review of Systems  Constitutional: Negative for fever, activity change, appetite change and fatigue.  HENT: Negative for congestion, facial swelling, rhinorrhea and trouble swallowing.   Eyes: Negative for photophobia and pain.   Respiratory: Positive for cough and shortness of breath. Negative for chest tightness.   Cardiovascular: Negative for chest pain and leg swelling.  Gastrointestinal: Negative for nausea, vomiting, abdominal pain, diarrhea and constipation.  Endocrine: Negative for polydipsia and polyuria.  Genitourinary: Negative for dysuria, urgency, decreased urine volume and difficulty urinating.  Musculoskeletal: Negative for back pain and gait problem.  Skin: Negative for color change, rash and wound.  Allergic/Immunologic: Negative for immunocompromised state.  Neurological: Negative for dizziness, facial asymmetry, speech difficulty, weakness, numbness and headaches.  Psychiatric/Behavioral: Negative for confusion, decreased concentration and agitation.      Allergies  Review of patient's allergies indicates no known allergies.  Home Medications   Prior to Admission medications   Medication Sig Start Date End Date Taking? Authorizing Provider  albuterol (PROVENTIL HFA;VENTOLIN HFA) 108 (90 BASE) MCG/ACT inhaler Inhale 2 puffs into the lungs every 6 (six) hours as needed for wheezing or shortness of breath (wheezing and sob).   Yes Historical Provider, MD  albuterol (PROVENTIL) (2.5 MG/3ML) 0.083% nebulizer solution Take 3 mLs (2.5 mg total) by nebulization every 4 (four) hours as needed for wheezing or shortness of breath. 07/01/14  Yes Janece Canterbury, MD  cetirizine (ZYRTEC) 10 MG tablet Take 10 mg by mouth daily.   Yes Historical Provider, MD  Cholecalciferol (VITAMIN D) 2000 UNITS tablet Take 2,000 Units by mouth daily.   Yes Historical Provider, MD  docusate sodium 100 MG CAPS Take 100 mg by mouth 2 (two) times daily. 07/01/14  Yes Janece Canterbury, MD  ENSURE (ENSURE) Take 237 mLs by mouth 4 (four) times daily -  with meals and at bedtime. 07/01/14  Yes Janece Canterbury, MD  ferrous sulfate 325 (65 FE) MG tablet Take 325 mg by mouth 2 (two) times daily with a meal. 03/09/13  Yes Sorin June Leap, MD   folic acid (FOLVITE) 062 MCG tablet Take 400 mcg by mouth daily.   Yes Historical Provider, MD  furosemide (LASIX) 20 MG tablet Take 20 mg by mouth daily.   Yes Historical Provider, MD  Ipratropium-Albuterol (COMBIVENT RESPIMAT IN) Inhale 2 puffs into the lungs 3 (three) times daily.   Yes Historical Provider, MD  ipratropium-albuterol (DUONEB) 0.5-2.5 (3) MG/3ML SOLN Take 3 mLs by nebulization 4 (four) times daily -  before meals and at bedtime. 07/01/14  Yes Janece Canterbury, MD  levofloxacin (LEVAQUIN) 750 MG tablet Take 1 tablet (750 mg total) by mouth every other day. 07/02/14  Yes Janece Canterbury, MD  LORazepam (ATIVAN) 0.5 MG tablet Take 0.5 mg by mouth every 6 (six) hours as needed for anxiety.   Yes Historical Provider, MD  magnesium citrate SOLN Take 1 Bottle by mouth once.   Yes Historical Provider, MD  morphine (MSIR) 15 MG tablet Take 1 tablet (15 mg total) by mouth every 4 (four) hours as needed for moderate pain or severe pain (or shortness of breath). 07/01/14  Yes Janece Canterbury, MD  pantoprazole (PROTONIX) 40 MG tablet Take 1 tablet (40 mg total) by mouth daily at 12 noon. 03/09/13  Yes Sorin June Leap, MD  polyethylene glycol (MIRALAX / GLYCOLAX) packet Take 17 g by mouth 2 (two) times daily. 07/01/14  Yes Janece Canterbury, MD  potassium chloride (MICRO-K) 10 MEQ CR capsule Take 10 mEq by mouth daily.    Yes Historical Provider, MD  predniSONE (DELTASONE) 20 MG tablet Take 2 tablets (40 mg total) by mouth daily with breakfast. 07/01/14  Yes Janece Canterbury, MD  senna (SENOKOT) 8.6 MG TABS tablet Take 2 tablets (17.2 mg total) by mouth at bedtime. Patient taking differently: Take 2 tablets by mouth 2 (two) times daily.  07/01/14  Yes Janece Canterbury, MD  sodium phosphate (FLEET) enema Place 1 enema rectally daily as needed (constipation). follow package directions   Yes Historical Provider, MD  bisacodyl (DULCOLAX) 10 MG suppository Place 1 suppository (10 mg total) rectally daily as needed for  moderate constipation. 07/01/14   Janece Canterbury, MD  budesonide-formoterol [facility]) 160-4.5 MCG/ACT inhaler Inhale 2 puffs into the lungs 2 (two) times daily. 07/01/14   Janece Canterbury, MD  fluticasone (FLONASE) 50 MCG/ACT nasal spray Place 2 sprays into the nose daily. Patient not taking: Reported on 02/07/2015 12/30/12   Thurnell Lose, MD  megestrol (MEGACE) 400 MG/10ML suspension Take 10 mLs (400 mg total) by mouth daily. Patient not taking: Reported on 02/07/2015 07/01/14   Janece Canterbury, MD  sodium chloride (OCEAN) 0.65 % SOLN nasal spray Place 1 spray into both nostrils as needed for congestion. Patient not taking: Reported on 02/07/2015 07/01/14   Janece Canterbury, MD  Vitamin D, Ergocalciferol, (DRISDOL) 50000 UNITS CAPS Take 50,000 Units by mouth every 7 (seven) days. On mondays    Historical Provider, MD   BP 142/77 mmHg  Pulse 123  Resp 20  SpO2 100% Physical Exam  Constitutional: He is oriented to person, place, and time. He appears well-developed and well-nourished. No distress.  HENT:  Head: Normocephalic and atraumatic.  Mouth/Throat: No oropharyngeal exudate.  Eyes: Pupils are  equal, round, and reactive to light.  Neck: Normal range of motion. Neck supple.  Cardiovascular: Normal rate, regular rhythm and normal heart sounds.  Exam reveals no gallop and no friction rub.   No murmur heard. Pulmonary/Chest: He is in respiratory distress. He has decreased breath sounds in the right upper field, the right middle field, the right lower field, the left middle field and the left lower field. He has wheezes in the right lower field and the left lower field. He has no rales.  Abdominal: Soft. Bowel sounds are normal. He exhibits no distension and no mass. There is no tenderness. There is no rebound and no guarding.  Musculoskeletal: Normal range of motion. He exhibits no edema or tenderness.  Neurological: He is alert and oriented to person, place, and time.  Skin: Skin is warm and  dry.  Psychiatric: He has a normal mood and affect.    ED Course  Procedures (including critical care time) Labs Review Labs Reviewed  BASIC METABOLIC PANEL - Abnormal; Notable for the following:    Potassium 3.3 (*)    Chloride 90 (*)    CO2 41 (*)    Glucose, Bld 142 (*)    Creatinine, Ser 1.43 (*)    GFR calc non Af Amer 46 (*)    GFR calc Af Amer 53 (*)    All other components within normal limits  CBC - Abnormal; Notable for the following:    RBC 2.95 (*)    Hemoglobin 9.0 (*)    HCT 26.6 (*)    RDW 20.5 (*)    All other components within normal limits  BLOOD GAS, ARTERIAL - Abnormal; Notable for the following:    pH, Arterial 7.451 (*)    pCO2 arterial 61.8 (*)    pO2, Arterial 42.5 (*)    Bicarbonate 42.4 (*)    Acid-Base Excess 16.2 (*)    All other components within normal limits  BLOOD GAS, ARTERIAL - Abnormal; Notable for the following:    pCO2 arterial 70.0 (*)    pO2, Arterial 57.7 (*)    Bicarbonate 40.4 (*)    Acid-Base Excess 13.5 (*)    All other components within normal limits  I-STAT TROPOININ, ED  I-STAT TROPOININ, ED    Imaging Review Ct Angio Chest Pe W/cm &/or Wo Cm  02/07/2015   CLINICAL DATA:  pt with Hx of COPD and Sickle Cell from home, sent by hospice nurse for SOB x 3 days, productive cough with yellow sputum, hospice nurse administered 3 separate nebulizer treatments without success.  EXAM: CT ANGIOGRAPHY CHEST WITH CONTRAST  TECHNIQUE: Multidetector CT imaging of the chest was performed using the standard protocol during bolus administration of intravenous contrast. Multiplanar CT image reconstructions and MIPs were obtained to evaluate the vascular anatomy.  CONTRAST:  103mL OMNIPAQUE IOHEXOL 350 MG/ML SOLN  COMPARISON:  06/25/2014 and 08/08/2009 as well as CT abdomen 03/01/2013  FINDINGS: Lungs are well inflated and demonstrate moderate diffuse bilateral centriacinar emphysematous disease. Minimal scarring/atelectasis over the posterior lung  bases. No focal airspace consolidation or effusion. There is a enlarging spiculated nodule over the left lower lobe measuring 1.4 cm. Airways are within normal.  Heart is normal size. Pulmonary arterial system is within normal without evidence of emboli. No evidence of mediastinal hilar adenopathy. Remaining mediastinal structures are unremarkable.  Images through the upper abdomen are unchanged. Degenerative change with sclerosis and endplate changes over the thoracic spine which are stable compatible with known sickle cell  disease.  Review of the MIP images confirms the above findings.  IMPRESSION: No evidence of pulmonary embolism.  No acute cardiopulmonary disease. Minimal posterior bibasilar scarring/ atelectasis. Severe emphysematous disease.  1.4 cm spiculated nodule over the left lower lobe suspicious for primary malignancy. Recommend PET-CT for further evaluation.   Electronically Signed   By: Marin Olp M.D.   On: 02/07/2015 19:20   Dg Chest Port 1 View  02/07/2015   CLINICAL DATA:  Increase short of breath, COPD. Sickle cell disease.  EXAM: PORTABLE CHEST - 1 VIEW  COMPARISON:  Radiograph 06/24/2014  FINDINGS: Normal cardiac silhouette. There is chronic scarring in atelectasis at the right lung base. Lungs are hyperinflated. No focal infiltrate or pulmonary edema. No pneumothorax.  IMPRESSION: No acute cardiopulmonary findings. No evidence pneumonia. Hyperinflated lungs with right basilar scarring and atelectasis.   Electronically Signed   By: Suzy Bouchard M.D.   On: 02/07/2015 16:27     EKG Interpretation   Date/Time:  Friday February 07 2015 15:53:56 EDT Ventricular Rate:  135 PR Interval:  119 QRS Duration: 81 QT Interval:  290 QTC Calculation: 435 R Axis:   93 Text Interpretation:  Sinus tachycardia Multiple premature complexes, vent   Consider right atrial enlargement Right axis deviation Probable  anteroseptal infarct, old Minimal ST depression, inferior leads Confirmed  by  Mckyle Solanki  MD, Brocha Gilliam [phone]) on 02/07/2015 4:20:09 PM      MDM   Final diagnoses:  Dyspnea  Tachycardia  Acute respiratory failure with hypoxia and hypercarbia  COPD exacerbation   Pt is a 79 y.o. male with Pmhx as above who presents with 3 days of SOB with dry cough, subjective fever, chills, body aches, and 3 days of constipation. Pt presents on CPAP, which was initiated by EMS.  He has increased work of breathing with decreased air movement and prolonged expiratory phase throughout.  Abdomen is distended.  The bowel sounds are normal and abdomen is nontender.  Pt placed back on Bipap as he did not feel as well on [place] or NRB, and is really not feeling much improved now. Pt persistently tachycardic, CTA ordered. Pt hypercarbic on ABG   CTA negative for PE, does have nodule that will need further eval. Pt sleeping, but awakens easily,  [facility] consulted and will admit to step down. CO2 worsened, will watch closely.    Ernestina Patches, MD 02/07/15 2037

## 2015-02-08 DIAGNOSIS — R911 Solitary pulmonary nodule: Secondary | ICD-10-CM | POA: Diagnosis present

## 2015-02-08 LAB — COMPREHENSIVE METABOLIC PANEL
ALBUMIN: 3.5 g/dL (ref 3.5–5.2)
ALT: 13 U/L (ref 0–53)
ANION GAP: 8 (ref 5–15)
AST: 35 U/L (ref 0–37)
Alkaline Phosphatase: 50 U/L (ref 39–117)
BUN: 14 mg/dL (ref 6–23)
CHLORIDE: 93 mmol/L — AB (ref 96–112)
CO2: 41 mmol/L — AB (ref 19–32)
CREATININE: 1.34 mg/dL (ref 0.50–1.35)
Calcium: 9.2 mg/dL (ref 8.4–10.5)
GFR, EST AFRICAN AMERICAN: 58 mL/min — AB (ref >90)
GFR, EST NON AFRICAN AMERICAN: 50 mL/min — AB (ref >90)
Glucose, Bld: 142 mg/dL — ABNORMAL HIGH (ref 70–99)
Potassium: 4.8 mmol/L (ref 3.5–5.1)
Sodium: 142 mmol/L (ref 135–145)
Total Bilirubin: 0.8 mg/dL (ref 0.3–1.2)
Total Protein: 5.9 g/dL — ABNORMAL LOW (ref 6.0–8.3)

## 2015-02-08 LAB — CBC
HCT: 26.1 % — ABNORMAL LOW (ref 39.0–52.0)
Hemoglobin: 9 g/dL — ABNORMAL LOW (ref 13.0–17.0)
MCH: 31.3 pg (ref 26.0–34.0)
MCHC: 34.5 g/dL (ref 30.0–36.0)
MCV: 90.6 fL (ref 78.0–100.0)
Platelets: 114 10*3/uL — ABNORMAL LOW (ref 150–400)
RBC: 2.88 MIL/uL — AB (ref 4.22–5.81)
RDW: 20.6 % — ABNORMAL HIGH (ref 11.5–15.5)
WBC: 4.3 10*3/uL (ref 4.0–10.5)

## 2015-02-08 LAB — MAGNESIUM: Magnesium: 2.3 mg/dL (ref 1.5–2.5)

## 2015-02-08 LAB — INFLUENZA PANEL BY PCR (TYPE A & B)
H1N1 flu by pcr: NOT DETECTED
Influenza A By PCR: NEGATIVE
Influenza B By PCR: NEGATIVE

## 2015-02-08 LAB — PHOSPHORUS: PHOSPHORUS: 4.6 mg/dL (ref 2.3–4.6)

## 2015-02-08 LAB — TSH: TSH: 0.251 u[IU]/mL — ABNORMAL LOW (ref 0.350–4.500)

## 2015-02-08 LAB — MRSA PCR SCREENING: MRSA BY PCR: NEGATIVE

## 2015-02-08 MED ORDER — METHYLPREDNISOLONE SODIUM SUCC 125 MG IJ SOLR
60.0000 mg | Freq: Four times a day (QID) | INTRAMUSCULAR | Status: DC
  Administered 2015-02-08 – 2015-02-11 (×12): 60 mg via INTRAVENOUS
  Filled 2015-02-08 (×12): qty 2

## 2015-02-08 MED ORDER — OSELTAMIVIR PHOSPHATE 30 MG PO CAPS: 30.0000 mg | ORAL_CAPSULE | Freq: Two times a day (BID) | ORAL | Status: DC

## 2015-02-08 NOTE — Progress Notes (Signed)
CRITICAL VALUE ALERT  Critical value received:  CO2: 41  Date of notification:  02/08/15  Time of notification:  0500  Critical value read back:Yes.    Nurse who received alert:  Kristine Royal  MD notified (1st page): Fredirick Maudlin, NP ([place])  Time of first page:  0509  MD notified (2nd page):  Time of second page:  Responding MD:  Fredirick Maudlin, NP  Time MD responded:  [phone]

## 2015-02-08 NOTE — Consult Note (Signed)
PULMONARY / CRITICAL CARE MEDICINE   Name: MASSIMO HARTLAND MRN: [id number] DOB: Oct 21, 1936    ADMISSION DATE:  02/07/2015 CONSULTATION DATE:  02/08/15   REFERRING MD :  DR Bonnielee Haff and Dr Roswell Nickel  CHIEF COMPLAINT:  Lung nodule   HISTORY OF PRESENT ILLNESS:  79 year old debilitated copd patient wtuih chronic resp failure, 3L o2, lives at  Home and PCCM under impression patient is DNR/DNI with home hospice. He has prior hx of PE. Admitted for acute on chronic resp hypercarbic failure. CTangio ruled out pulmonary embolism but showed 1.4cm spiculated nodule in left lower lobe superior segment. Patient being treated for AECOPD with bipap and steroids in SDU but [facility] hospitalist. Consult is for the nodule and per hospitalist notes family would like to know more about the nodule and wish for it to be "pursued". Currently patient resting and denies any complaints.   Review of his CT chest shows 2010 CT - no nodule in LLL LB6 area 2015 august - emergence of a nodule in [place] area 2016 April - spiculated LB6 area 1.4cm nodule  PAST MEDICAL HISTORY :   has a past medical history of COPD (chronic obstructive pulmonary disease); Sickle cell anemia; and Pulmonary embolism.  has past surgical history that includes No past surgeries; Colonoscopy (N/A, 03/09/2013); and Esophagogastroduodenoscopy (N/A, 03/09/2013). Prior to Admission medications   Medication Sig Start Date End Date Taking? Authorizing Provider  albuterol (PROVENTIL HFA;VENTOLIN HFA) 108 (90 BASE) MCG/ACT inhaler Inhale 2 puffs into the lungs every 6 (six) hours as needed for wheezing or shortness of breath (wheezing and sob).   Yes Historical Provider, MD  albuterol (PROVENTIL) (2.5 MG/3ML) 0.083% nebulizer solution Take 3 mLs (2.5 mg total) by nebulization every 4 (four) hours as needed for wheezing or shortness of breath. 07/01/14  Yes Janece Canterbury, MD  cetirizine (ZYRTEC) 10 MG tablet Take 10 mg by mouth daily.   Yes Historical  Provider, MD  Cholecalciferol (VITAMIN D) 2000 UNITS tablet Take 2,000 Units by mouth daily.   Yes Historical Provider, MD  docusate sodium 100 MG CAPS Take 100 mg by mouth 2 (two) times daily. 07/01/14  Yes Janece Canterbury, MD  ENSURE (ENSURE) Take 237 mLs by mouth 4 (four) times daily -  with meals and at bedtime. 07/01/14  Yes Janece Canterbury, MD  ferrous sulfate 325 (65 FE) MG tablet Take 325 mg by mouth 2 (two) times daily with a meal. 03/09/13  Yes Sorin June Leap, MD  folic acid (FOLVITE) 347 MCG tablet Take 400 mcg by mouth daily.   Yes Historical Provider, MD  furosemide (LASIX) 20 MG tablet Take 20 mg by mouth daily.   Yes Historical Provider, MD  Ipratropium-Albuterol (COMBIVENT RESPIMAT IN) Inhale 2 puffs into the lungs 3 (three) times daily.   Yes Historical Provider, MD  ipratropium-albuterol (DUONEB) 0.5-2.5 (3) MG/3ML SOLN Take 3 mLs by nebulization 4 (four) times daily -  before meals and at bedtime. 07/01/14  Yes Janece Canterbury, MD  levofloxacin (LEVAQUIN) 750 MG tablet Take 1 tablet (750 mg total) by mouth every other day. 07/02/14  Yes Janece Canterbury, MD  LORazepam (ATIVAN) 0.5 MG tablet Take 0.5 mg by mouth every 6 (six) hours as needed for anxiety.   Yes Historical Provider, MD  magnesium citrate SOLN Take 1 Bottle by mouth once.   Yes Historical Provider, MD  morphine (MSIR) 15 MG tablet Take 1 tablet (15 mg total) by mouth every 4 (four) hours as needed for moderate  pain or severe pain (or shortness of breath). 07/01/14  Yes Janece Canterbury, MD  pantoprazole (PROTONIX) 40 MG tablet Take 1 tablet (40 mg total) by mouth daily at 12 noon. 03/09/13  Yes Sorin June Leap, MD  polyethylene glycol (MIRALAX / GLYCOLAX) packet Take 17 g by mouth 2 (two) times daily. 07/01/14  Yes Janece Canterbury, MD  potassium chloride (MICRO-K) 10 MEQ CR capsule Take 10 mEq by mouth daily.    Yes Historical Provider, MD  predniSONE (DELTASONE) 20 MG tablet Take 2 tablets (40 mg total) by mouth daily with breakfast.  07/01/14  Yes Janece Canterbury, MD  senna (SENOKOT) 8.6 MG TABS tablet Take 2 tablets (17.2 mg total) by mouth at bedtime. Patient taking differently: Take 2 tablets by mouth 2 (two) times daily.  07/01/14  Yes Janece Canterbury, MD  sodium phosphate (FLEET) enema Place 1 enema rectally daily as needed (constipation). follow package directions   Yes Historical Provider, MD  bisacodyl (DULCOLAX) 10 MG suppository Place 1 suppository (10 mg total) rectally daily as needed for moderate constipation. 07/01/14   Janece Canterbury, MD  budesonide-formoterol [facility]) 160-4.5 MCG/ACT inhaler Inhale 2 puffs into the lungs 2 (two) times daily. 07/01/14   Janece Canterbury, MD  fluticasone (FLONASE) 50 MCG/ACT nasal spray Place 2 sprays into the nose daily. Patient not taking: Reported on 02/07/2015 12/30/12   Thurnell Lose, MD  megestrol (MEGACE) 400 MG/10ML suspension Take 10 mLs (400 mg total) by mouth daily. Patient not taking: Reported on 02/07/2015 07/01/14   Janece Canterbury, MD  sodium chloride (OCEAN) 0.65 % SOLN nasal spray Place 1 spray into both nostrils as needed for congestion. Patient not taking: Reported on 02/07/2015 07/01/14   Janece Canterbury, MD  Vitamin D, Ergocalciferol, (DRISDOL) 50000 UNITS CAPS Take 50,000 Units by mouth every 7 (seven) days. On mondays    Historical Provider, MD   No Known Allergies  FAMILY HISTORY:  has no family status information on file.  SOCIAL HISTORY:  reports that he quit smoking about 5 months ago. He has never used smokeless tobacco. He reports that he does not drink alcohol or use illicit drugs.  REVIEW OF SYSTEMS:  unelicibale ROS 11 point due to patient status and poor historian and ongoing AECOPD    VITAL SIGNS: Temp:  [97.7 F (36.5 C)-98 F (36.7 C)] 97.7 F (36.5 C) (04/09 0800) Pulse Rate:  [55-133] 107 (04/09 1200) Resp:  [16-24] 22 (04/09 1200) BP: (106-166)/(60-112) 161/83 mmHg (04/09 1200) SpO2:  [93 %-100 %] 100 % (04/09 1200) FiO2 (%):  [55 %]  55 % (04/09 1143) Weight:  [56 kg (123 lb 7.3 oz)] 56 kg (123 lb 7.3 oz) (04/08 2153) HEMODYNAMICS:   VENTILATOR SETTINGS: Vent Mode:  [-]  FiO2 (%):  [55 %] 55 % INTAKE / OUTPUT:  Intake/Output Summary (Last 24 hours) at 02/08/15 1242 Last data filed at 02/08/15 1100  Gross per 24 hour  Intake 1139.17 ml  Output    750 ml  Net 389.17 ml    PHYSICAL EXAMINATION: General:  Debilitated, frail Neuro:  Sleeping but arousable and asked some questions appropriately. Could not give much history. Moves all 4s HEENT:  NEck supple Cardiovascular:  Normal heart sounds. No murmurs Lungs:  Barrell chest +. No wheeze. CTA bilaterally Abdomen:  Soft. No mass. Normal bowel sounds Musculoskeletal:  No cyanosis.No clubbing. No edema Skin:  Intact anteriorly  LABS: PULMONARY  Recent Labs Lab 02/07/15 1653 02/07/15 2008  PHART 7.451* 7.380  PCO2ART 61.8*  70.0*  PO2ART 42.5* 57.7*  HCO3 42.4* 40.4*  TCO2 39.5 38.3  O2SAT 77.9 87.0    CBC  Recent Labs Lab 02/07/15 1600 02/08/15 0350  HGB 9.0* 9.0*  HCT 26.6* 26.1*  WBC 7.2 4.3  PLT 168 114*    COAGULATION No results for input(s): INR in the last 168 hours.  CARDIAC  No results for input(s): TROPONINI in the last 168 hours. No results for input(s): PROBNP in the last 168 hours.   CHEMISTRY  Recent Labs Lab 02/07/15 1600 02/08/15 0350  NA 140 142  K 3.3* 4.8  CL 90* 93*  CO2 41* 41*  GLUCOSE 142* 142*  BUN 15 14  CREATININE 1.43* 1.34  CALCIUM 9.2 9.2  MG  --  2.3  PHOS  --  4.6   Estimated Creatinine Clearance: 37.1 mL/min (by C-G formula based on Cr of 1.34).   LIVER  Recent Labs Lab 02/08/15 0350  AST 35  ALT 13  ALKPHOS 50  BILITOT 0.8  PROT 5.9*  ALBUMIN 3.5     INFECTIOUS No results for input(s): LATICACIDVEN, PROCALCITON in the last 168 hours.   ENDOCRINE CBG (last 3)  No results for input(s): GLUCAP in the last 72 hours.       IMAGING x48h Dg Abd 1 View  02/08/2015    CLINICAL DATA:  Abdominal distention today.  EXAM: ABDOMEN - 1 VIEW  COMPARISON:  CT abdomen and pelvis 06/24/2014.  Abdomen 06/24/2014.  FINDINGS: Scattered gas and stool throughout the colon. No small or large bowel distention. Residual contrast material in the renal collecting systems and bladder. Fibrosis in the lung bases. Degenerative changes in the spine and hips. Bone sclerosis in the proximal femurs probably representing bone infarcts due to sickle cell disease.  IMPRESSION: Nonobstructive bowel gas pattern.   Electronically Signed   By: Lucienne Capers M.D.   On: 02/08/2015 01:23   Ct Angio Chest Pe W/cm &/or Wo Cm  02/07/2015   CLINICAL DATA:  pt with Hx of COPD and Sickle Cell from home, sent by hospice nurse for SOB x 3 days, productive cough with yellow sputum, hospice nurse administered 3 separate nebulizer treatments without success.  EXAM: CT ANGIOGRAPHY CHEST WITH CONTRAST  TECHNIQUE: Multidetector CT imaging of the chest was performed using the standard protocol during bolus administration of intravenous contrast. Multiplanar CT image reconstructions and MIPs were obtained to evaluate the vascular anatomy.  CONTRAST:  32mL OMNIPAQUE IOHEXOL 350 MG/ML SOLN  COMPARISON:  06/25/2014 and 08/08/2009 as well as CT abdomen 03/01/2013  FINDINGS: Lungs are well inflated and demonstrate moderate diffuse bilateral centriacinar emphysematous disease. Minimal scarring/atelectasis over the posterior lung bases. No focal airspace consolidation or effusion. There is a enlarging spiculated nodule over the left lower lobe measuring 1.4 cm. Airways are within normal.  Heart is normal size. Pulmonary arterial system is within normal without evidence of emboli. No evidence of mediastinal hilar adenopathy. Remaining mediastinal structures are unremarkable.  Images through the upper abdomen are unchanged. Degenerative change with sclerosis and endplate changes over the thoracic spine which are stable compatible with  known sickle cell disease.  Review of the MIP images confirms the above findings.  IMPRESSION: No evidence of pulmonary embolism.  No acute cardiopulmonary disease. Minimal posterior bibasilar scarring/ atelectasis. Severe emphysematous disease.  1.4 cm spiculated nodule over the left lower lobe suspicious for primary malignancy. Recommend PET-CT for further evaluation.   Electronically Signed   By: Marin Olp M.D.   On:  02/07/2015 19:20   Dg Chest Port 1 View  02/07/2015   CLINICAL DATA:  Increase short of breath, COPD. Sickle cell disease.  EXAM: PORTABLE CHEST - 1 VIEW  COMPARISON:  Radiograph 06/24/2014  FINDINGS: Normal cardiac silhouette. There is chronic scarring in atelectasis at the right lung base. Lungs are hyperinflated. No focal infiltrate or pulmonary edema. No pneumothorax.  IMPRESSION: No acute cardiopulmonary findings. No evidence pneumonia. Hyperinflated lungs with right basilar scarring and atelectasis.   Electronically Signed   By: Suzy Bouchard M.D.   On: 02/07/2015 16:27       ASSESSMENT / PLAN:  PULMONARY  A: #Baseline   - copd with chronic resp failure - 3L, debiliated, on home hospice  #Current  - LLL LB6 nodule   - Absent in 2010.  New in august 2015. Grown to 1.4cm April 2016 with spiculation  - This is high pre-test probl for stage 1A NSCLC  - LEft alone - natural course of this disease is > 1year to several years   P:   This is potentiall curable with lobectomy but surgery is a medical contraindication given horrible chronic resp failure and debility Potentially curable with XRT   - however, he will typically need bronch/biopsy to confirm cancer before Rx and doubt he can handle the procedure  - consideration can be made for empiric XRT but need MDT opd decision making on this and moreover he is likely to die from COPD than lung cancer.    These decisions have to be taken in context of his COPD, hospice status, goals. Overall our rec would be to leave  [place]   Dr. Brand Males, M.D., [facility]C.P Pulmonary and Critical Care Medicine Staff Physician [facility] Pager: [phone], If no answer or between  15:00h - 7:00h: call [phone]  02/08/2015 12:55 PM

## 2015-02-08 NOTE — Progress Notes (Signed)
[facility] PROGRESS NOTE  Anthony [name] OIN:[id number] DOB: [birth year] DOA: 02/07/2015  PCP: Philis Fendt, MD  Brief HPI: 79 year old African-American male brought in due to worsening shortness of breath. He was admitted with COPD exacerbation and placed on BiPAP. He is receiving hospice services at home due to his advanced COPD.  Past medical history:  Past Medical History  Diagnosis Date  . COPD (chronic obstructive pulmonary disease)   . Sickle cell anemia   . Pulmonary embolism     Consultants: Pulmonology  Procedures: None  Antibiotics: Doxycycline  Subjective: Patient still on BiPAP. He states that he is feeling slightly better this morning. Denies any chest pain. No nausea, vomiting.  Objective: Vital Signs  Filed Vitals:   02/08/15 0300 02/08/15 0432 02/08/15 0500 02/08/15 0700  BP:  123/70    Pulse: 101 97 103 100  Temp: 98 F (36.7 C)     TempSrc: Oral     Resp: 16 16 17 16   Height:      Weight:      SpO2: 99% 98% 98% 98%    Intake/Output Summary (Last 24 hours) at 02/08/15 0737 Last data filed at 02/08/15 0600  Gross per 24 hour  Intake 969.17 ml  Output    425 ml  Net 544.17 ml   Filed Weights   02/07/15 2153  Weight: 56 kg (123 lb 7.3 oz)    General appearance: alert, cooperative and fatigued Head: Normocephalic, without obvious abnormality, atraumatic. BiPAP. Resp: Decreased air entry bilaterally with a few scattered wheezes. No rhonchi. No definite crackles. Cardio: regular rate and rhythm, S1, S2 normal, no murmur, click, rub or gallop GI: soft, non-tender; bowel sounds normal; no masses,  no organomegaly Extremities: extremities normal, atraumatic, no cyanosis or edema Neurologic: Alert. No focal deficits.  Lab Results:  Basic Metabolic Panel:  Recent Labs Lab 02/07/15 1600 02/08/15 0350  NA 140 142  K 3.3* 4.8  CL 90* 93*  CO2 41* 41*  GLUCOSE 142* 142*  BUN 15 14  CREATININE 1.43* 1.34  CALCIUM 9.2 9.2    MG  --  2.3  PHOS  --  4.6   Liver Function Tests:  Recent Labs Lab 02/08/15 0350  AST 35  ALT 13  ALKPHOS 50  BILITOT 0.8  PROT 5.9*  ALBUMIN 3.5   CBC:  Recent Labs Lab 02/07/15 1600 02/08/15 0350  WBC 7.2 4.3  HGB 9.0* 9.0*  HCT 26.6* 26.1*  MCV 90.2 90.6  PLT 168 114*    Recent Results (from the past 240 hour(s))  MRSA PCR Screening     Status: None   Collection Time: 02/07/15 10:05 PM  Result Value Ref Range Status   MRSA by PCR NEGATIVE NEGATIVE Final    Comment:        The GeneXpert MRSA Assay (FDA approved for NASAL specimens only), is one component of a comprehensive MRSA colonization surveillance program. It is not intended to diagnose MRSA infection nor to guide or monitor treatment for MRSA infections.       Studies/Results: Dg Abd 1 View  02/08/2015   CLINICAL DATA:  Abdominal distention today.  EXAM: ABDOMEN - 1 VIEW  COMPARISON:  CT abdomen and pelvis 06/24/2014.  Abdomen 06/24/2014.  FINDINGS: Scattered gas and stool throughout the colon. No small or large bowel distention. Residual contrast material in the renal collecting systems and bladder. Fibrosis in the lung bases. Degenerative changes in the spine and hips. Bone sclerosis in the proximal femurs  probably representing bone infarcts due to sickle cell disease.  IMPRESSION: Nonobstructive bowel gas pattern.   Electronically Signed   By: Lucienne Capers M.D.   On: 02/08/2015 01:23   Ct Angio Chest Pe W/cm &/or Wo Cm  02/07/2015   CLINICAL DATA:  pt with Hx of COPD and Sickle Cell from home, sent by hospice nurse for SOB x 3 days, productive cough with yellow sputum, hospice nurse administered 3 separate nebulizer treatments without success.  EXAM: CT ANGIOGRAPHY CHEST WITH CONTRAST  TECHNIQUE: Multidetector CT imaging of the chest was performed using the standard protocol during bolus administration of intravenous contrast. Multiplanar CT image reconstructions and MIPs were obtained to evaluate  the vascular anatomy.  CONTRAST:  25mL OMNIPAQUE IOHEXOL 350 MG/ML SOLN  COMPARISON:  06/25/2014 and 08/08/2009 as well as CT abdomen 03/01/2013  FINDINGS: Lungs are well inflated and demonstrate moderate diffuse bilateral centriacinar emphysematous disease. Minimal scarring/atelectasis over the posterior lung bases. No focal airspace consolidation or effusion. There is a enlarging spiculated nodule over the left lower lobe measuring 1.4 cm. Airways are within normal.  Heart is normal size. Pulmonary arterial system is within normal without evidence of emboli. No evidence of mediastinal hilar adenopathy. Remaining mediastinal structures are unremarkable.  Images through the upper abdomen are unchanged. Degenerative change with sclerosis and endplate changes over the thoracic spine which are stable compatible with known sickle cell disease.  Review of the MIP images confirms the above findings.  IMPRESSION: No evidence of pulmonary embolism.  No acute cardiopulmonary disease. Minimal posterior bibasilar scarring/ atelectasis. Severe emphysematous disease.  1.4 cm spiculated nodule over the left lower lobe suspicious for primary malignancy. Recommend PET-CT for further evaluation.   Electronically Signed   By: Marin Olp M.D.   On: 02/07/2015 19:20   Dg Chest Port 1 View  02/07/2015   CLINICAL DATA:  Increase short of breath, COPD. Sickle cell disease.  EXAM: PORTABLE CHEST - 1 VIEW  COMPARISON:  Radiograph 06/24/2014  FINDINGS: Normal cardiac silhouette. There is chronic scarring in atelectasis at the right lung base. Lungs are hyperinflated. No focal infiltrate or pulmonary edema. No pneumothorax.  IMPRESSION: No acute cardiopulmonary findings. No evidence pneumonia. Hyperinflated lungs with right basilar scarring and atelectasis.   Electronically Signed   By: Suzy Bouchard M.D.   On: 02/07/2015 16:27    Medications:  Scheduled: . budesonide-formoterol  2 puff Inhalation BID  . docusate sodium  100 mg  Oral BID  . doxycycline  100 mg Oral Q12H  . enoxaparin (LOVENOX) injection  40 mg Subcutaneous Q24H  . feeding supplement (GLUCERNA SHAKE)  237 mL Oral TID WC & HS  . ferrous sulfate  325 mg Oral BID WC  . folic acid  527 mcg Oral Daily  . guaiFENesin  600 mg Oral BID  . ipratropium-albuterol  3 mL Nebulization QID  . loratadine  10 mg Oral Daily  . methylPREDNISolone (SOLU-MEDROL) injection  60 mg Intravenous Q6H  . oseltamivir  75 mg Oral BID  . pantoprazole  40 mg Oral Q1200  . senna  2 tablet Oral QHS  . sodium chloride  3 mL Intravenous Q12H   Continuous: . albuterol 10 mg/hr (02/07/15 1655)   POE:[id number] **OR** acetaminophen, albuterol, bisacodyl, guaiFENesin-dextromethorphan, HYDROcodone-acetaminophen, LORazepam, morphine, ondansetron **OR** ondansetron (ZOFRAN) IV, polyethylene glycol, sodium phosphate  Assessment/Plan:  Active Problems:   Sickle cell disease   PULMONARY EMBOLISM, HX OF   Anemia   COPD exacerbation   Hypokalemia   Pulmonary  nodule    Acute COPD exacerbation Continue with steroids, nebulizer treatments, antibiotics. Continue with inhaled steroids as well. Continue BiPAP for now. Patient does feel better this morning. Attempt to wean off of BiPAP today. Continue Tamiflu for now, although possibility of influenza is low. Influenza PCR is pending. Patient under hospice services at home for advanced COPD.  Left lower lobe spiculated Pulmonary nodule Apparently, family wants to pursue this. This likely can be pursued as an outpatient. Will not likely change his overall long-term prognosis. But apparently, pulmonology has been consulted. We'll await their input.  Abdominal distention X-ray does not show any obstruction. Abdomen is soft today. Continue to monitor.  Low TSH Repeat tomorrow along with free T4.  Normocytic Anemia  This is chronic. Hemoglobin is at baseline.  History of Sickle cell disease Currently  stable  Hypokalemia Corrected.  DVT Prophylaxis: Lovenox    Code Status: DO NOT RESUSCITATE  Family Communication: No family at bedside  Disposition Plan: Will remain in step down. Try to wean off of BiPAP today.    LOS: 1 day   [facility] Pager [phone] 02/08/2015, 7:37 AM  If 7PM-7AM, please contact night-coverage at [email or web address], password [facility]

## 2015-02-09 LAB — BLOOD GAS, ARTERIAL
Acid-Base Excess: 14.1 mmol/L — ABNORMAL HIGH (ref 0.0–2.0)
Bicarbonate: 41.9 mEq/L — ABNORMAL HIGH (ref 20.0–24.0)
Drawn by: 232811
FIO2: 0.55 %
O2 Saturation: 99.8 %
PO2 ART: 171 mmHg — AB (ref 80.0–100.0)
Patient temperature: 98.6
TCO2: 39.5 mmol/L (ref 0–100)
pCO2 arterial: 78 mmHg (ref 35.0–45.0)
pH, Arterial: 7.349 — ABNORMAL LOW (ref 7.350–7.450)

## 2015-02-09 LAB — BASIC METABOLIC PANEL
Anion gap: 7 (ref 5–15)
BUN: 24 mg/dL — ABNORMAL HIGH (ref 6–23)
CALCIUM: 9.5 mg/dL (ref 8.4–10.5)
CO2: 41 mmol/L (ref 19–32)
Chloride: 94 mmol/L — ABNORMAL LOW (ref 96–112)
Creatinine, Ser: 1.12 mg/dL (ref 0.50–1.35)
GFR calc Af Amer: 72 mL/min — ABNORMAL LOW (ref >90)
GFR calc non Af Amer: 62 mL/min — ABNORMAL LOW (ref >90)
Glucose, Bld: 127 mg/dL — ABNORMAL HIGH (ref 70–99)
Potassium: 5 mmol/L (ref 3.5–5.1)
Sodium: 142 mmol/L (ref 135–145)

## 2015-02-09 LAB — CBC
HCT: 28.7 % — ABNORMAL LOW (ref 39.0–52.0)
Hemoglobin: 9.6 g/dL — ABNORMAL LOW (ref 13.0–17.0)
MCH: 30.1 pg (ref 26.0–34.0)
MCHC: 33.4 g/dL (ref 30.0–36.0)
MCV: 90 fL (ref 78.0–100.0)
Platelets: 144 10*3/uL — ABNORMAL LOW (ref 150–400)
RBC: 3.19 MIL/uL — ABNORMAL LOW (ref 4.22–5.81)
RDW: 19.9 % — AB (ref 11.5–15.5)
WBC: 7 10*3/uL (ref 4.0–10.5)

## 2015-02-09 LAB — T4, FREE: Free T4: 0.98 ng/dL (ref 0.80–1.80)

## 2015-02-09 LAB — TSH: TSH: 0.18 u[IU]/mL — AB (ref 0.350–4.500)

## 2015-02-09 MED ORDER — MORPHINE SULFATE 2 MG/ML IJ SOLN
1.0000 mg | INTRAMUSCULAR | Status: DC | PRN
  Administered 2015-02-10 (×2): 1 mg via INTRAVENOUS
  Filled 2015-02-09 (×3): qty 1

## 2015-02-09 MED ORDER — LORAZEPAM 2 MG/ML IJ SOLN
0.5000 mg | Freq: Four times a day (QID) | INTRAMUSCULAR | Status: DC | PRN
  Administered 2015-02-09: 0.5 mg via INTRAVENOUS
  Filled 2015-02-09: qty 1

## 2015-02-09 NOTE — Clinical Social Work Psychosocial (Signed)
Clinical Social Work Department BRIEF PSYCHOSOCIAL ASSESSMENT 02/09/2015  Patient:  Anthony [name], Anthony [name]     Account Number:  [id number]     Admit date:  02/07/2015  Clinical Social Worker:  Dede Query, CLINICAL SOCIAL WORKER  Date/Time:  02/09/2015 04:36 PM  Referred by:  Physician  Date Referred:  02/09/2015 Referred for  Other - See comment   Other Referral:   Residential Hospice   Interview type:  Family Other interview type:   Pt's sons Ronalee Belts and [place].    PSYCHOSOCIAL DATA Living Status:  ALONE Admitted from facility:   Level of care:   Primary support name:  [name] Primary support relationship to patient:  CHILD, ADULT Degree of support available:   High/ Pt's son is avaialble and supportive    CURRENT CONCERNS  Other Concerns:   CSW received a consult for possible residential hospice. CSW attempted to meet with pt at bedside but he was sleeping.  CSW called and spoke with both of pt's sons, [name] and Ronalee Belts.  CSW encourage pt's sons to discuss history and needs.  CSW encouraged pt's sons to explore thoughts and feelings related to pt's diagnoses.  CSW provided active and supportive listening    SOCIAL WORK ASSESSMENT / PLAN  Assessment/plan status:  Psychosocial Support/Ongoing Assessment of Needs Other assessment/ plan:  CSW to monitor pt's chart for possible residential hospice or rehab needs Information/referral to community resources:    PATIENT'S/FAMILY'S RESPONSE TO PLAN OF CARE: Pt's son talked about pt living alone with caregivers that come into the home every day except weekends.  Pt's son stated that [facility] has been working with pt for about 9 months.  Pt's son mike stated that pt's son [name] would be the one to help pt with decisions.  Pt's son [name] stated that pt has been to [facility] in the past for rehab.  Pt's son stated that pt "may be reluctant" to go to SNF for any rehab needs but if necessary he would like him to go.  Pt's son  stated that MD called him today to say that he was moving pt out of ICU.  Pt's son grateful for help  .Dede Query, LCSW [facility] Clinical Social Worker - Weekend Coverage cell #: [phone]

## 2015-02-09 NOTE — Progress Notes (Signed)
[facility] PROGRESS NOTE  Anthony [name] [id number]:[id number] DOB: [birth year] DOA: 02/07/2015  PCP: Philis Fendt, MD  Brief HPI: 79 year old African-American male brought in due to worsening shortness of breath. He was admitted with COPD exacerbation and placed on BiPAP. He is receiving hospice services at home due to his advanced COPD.  Past medical history:  Past Medical History  Diagnosis Date  . COPD (chronic obstructive pulmonary disease)   . Sickle cell anemia   . Pulmonary embolism     Consultants: Pulmonology  Procedures: None  Antibiotics: Doxycycline  Subjective: Patient feels slightly better this morning. He required BiPAP overnight. Still feels weak. Denies any pain.   Objective: Vital Signs  Filed Vitals:   02/09/15 0200 02/09/15 0311 02/09/15 0400 02/09/15 0600  BP: 137/60 137/60 162/91 140/61  Pulse: 103 103 103 106  Temp:  97.6 F (36.4 C)    TempSrc:  Axillary    Resp: 18 21 19 17   Height:      Weight:      SpO2: 99% 98% 100% 100%    Intake/Output Summary (Last 24 hours) at 02/09/15 0730 Last data filed at 02/09/15 0200  Gross per 24 hour  Intake    120 ml  Output   1225 ml  Net  -1105 ml   Filed Weights   02/07/15 2153  Weight: 56 kg (123 lb 7.3 oz)    General appearance: alert, cooperative and fatigued Resp: Decreased air entry bilaterally with scattered wheezes. No rhonchi. No definite crackles. Cardio: regular rate and rhythm, S1, S2 normal, no murmur, click, rub or gallop GI: soft, non-tender; bowel sounds normal; no masses,  no organomegaly Extremities: extremities normal, atraumatic, no cyanosis or edema Neurologic: Alert. No focal deficits.  Lab Results:  Basic Metabolic Panel:  Recent Labs Lab 02/07/15 1600 02/08/15 0350 02/09/15 0400  NA 140 142 142  K 3.3* 4.8 5.0  CL 90* 93* 94*  CO2 41* 41* 41*  GLUCOSE 142* 142* 127*  BUN 15 14 24*  CREATININE 1.43* 1.34 1.12  CALCIUM 9.2 9.2 9.5  MG  --  2.3  --     PHOS  --  4.6  --    Liver Function Tests:  Recent Labs Lab 02/08/15 0350  AST 35  ALT 13  ALKPHOS 50  BILITOT 0.8  PROT 5.9*  ALBUMIN 3.5   CBC:  Recent Labs Lab 02/07/15 1600 02/08/15 0350 02/09/15 0400  WBC 7.2 4.3 7.0  HGB 9.0* 9.0* 9.6*  HCT 26.6* 26.1* 28.7*  MCV 90.2 90.6 90.0  PLT 168 114* 144*    Recent Results (from the past 240 hour(s))  MRSA PCR Screening     Status: None   Collection Time: 02/07/15 10:05 PM  Result Value Ref Range Status   MRSA by PCR NEGATIVE NEGATIVE Final    Comment:        The GeneXpert MRSA Assay (FDA approved for NASAL specimens only), is one component of a comprehensive MRSA colonization surveillance program. It is not intended to diagnose MRSA infection nor to guide or monitor treatment for MRSA infections.       Studies/Results: Dg Abd 1 View  02/08/2015   CLINICAL DATA:  Abdominal distention today.  EXAM: ABDOMEN - 1 VIEW  COMPARISON:  CT abdomen and pelvis 06/24/2014.  Abdomen 06/24/2014.  FINDINGS: Scattered gas and stool throughout the colon. No small or large bowel distention. Residual contrast material in the renal collecting systems and bladder. Fibrosis in the lung bases.  Degenerative changes in the spine and hips. Bone sclerosis in the proximal femurs probably representing bone infarcts due to sickle cell disease.  IMPRESSION: Nonobstructive bowel gas pattern.   Electronically Signed   By: Lucienne Capers M.D.   On: 02/08/2015 01:23   Ct Angio Chest Pe W/cm &/or Wo Cm  02/07/2015   CLINICAL DATA:  pt with Hx of COPD and Sickle Cell from home, sent by hospice nurse for SOB x 3 days, productive cough with yellow sputum, hospice nurse administered 3 separate nebulizer treatments without success.  EXAM: CT ANGIOGRAPHY CHEST WITH CONTRAST  TECHNIQUE: Multidetector CT imaging of the chest was performed using the standard protocol during bolus administration of intravenous contrast. Multiplanar CT image reconstructions and  MIPs were obtained to evaluate the vascular anatomy.  CONTRAST:  43mL OMNIPAQUE IOHEXOL 350 MG/ML SOLN  COMPARISON:  06/25/2014 and 08/08/2009 as well as CT abdomen 03/01/2013  FINDINGS: Lungs are well inflated and demonstrate moderate diffuse bilateral centriacinar emphysematous disease. Minimal scarring/atelectasis over the posterior lung bases. No focal airspace consolidation or effusion. There is a enlarging spiculated nodule over the left lower lobe measuring 1.4 cm. Airways are within normal.  Heart is normal size. Pulmonary arterial system is within normal without evidence of emboli. No evidence of mediastinal hilar adenopathy. Remaining mediastinal structures are unremarkable.  Images through the upper abdomen are unchanged. Degenerative change with sclerosis and endplate changes over the thoracic spine which are stable compatible with known sickle cell disease.  Review of the MIP images confirms the above findings.  IMPRESSION: No evidence of pulmonary embolism.  No acute cardiopulmonary disease. Minimal posterior bibasilar scarring/ atelectasis. Severe emphysematous disease.  1.4 cm spiculated nodule over the left lower lobe suspicious for primary malignancy. Recommend PET-CT for further evaluation.   Electronically Signed   By: Marin Olp M.D.   On: 02/07/2015 19:20   Dg Chest Port 1 View  02/07/2015   CLINICAL DATA:  Increase short of breath, COPD. Sickle cell disease.  EXAM: PORTABLE CHEST - 1 VIEW  COMPARISON:  Radiograph 06/24/2014  FINDINGS: Normal cardiac silhouette. There is chronic scarring in atelectasis at the right lung base. Lungs are hyperinflated. No focal infiltrate or pulmonary edema. No pneumothorax.  IMPRESSION: No acute cardiopulmonary findings. No evidence pneumonia. Hyperinflated lungs with right basilar scarring and atelectasis.   Electronically Signed   By: Suzy Bouchard M.D.   On: 02/07/2015 16:27    Medications:  Scheduled: . budesonide-formoterol  2 puff Inhalation  BID  . docusate sodium  100 mg Oral BID  . doxycycline  100 mg Oral Q12H  . enoxaparin (LOVENOX) injection  40 mg Subcutaneous Q24H  . feeding supplement (GLUCERNA SHAKE)  237 mL Oral TID WC & HS  . ferrous sulfate  325 mg Oral BID WC  . folic acid  409 mcg Oral Daily  . guaiFENesin  600 mg Oral BID  . ipratropium-albuterol  3 mL Nebulization QID  . loratadine  10 mg Oral Daily  . methylPREDNISolone (SOLU-MEDROL) injection  60 mg Intravenous Q6H  . pantoprazole  40 mg Oral Q1200  . senna  2 tablet Oral QHS  . sodium chloride  3 mL Intravenous Q12H   Continuous: . albuterol 10 mg/hr (02/07/15 1655)   BDZ:[id number] **OR** acetaminophen, albuterol, bisacodyl, guaiFENesin-dextromethorphan, HYDROcodone-acetaminophen, LORazepam, morphine, ondansetron **OR** ondansetron (ZOFRAN) IV, polyethylene glycol, sodium phosphate  Assessment/Plan:  Active Problems:   Sickle cell disease   PULMONARY EMBOLISM, HX OF   Anemia   COPD exacerbation  Hypokalemia   Pulmonary nodule   Incidental lung nodule, greater than or equal to 35mm    Acute COPD exacerbation Stable. Required BiPAP last night, but was off of it all day yesterday. Continue with steroids, nebulizer treatments, antibiotics. Continue with inhaled steroids as well. We will leave him in step down unit for now until he is able to stay off of BiPAP for about 24 hours. Influenza PCR was negative. Tamiflu has been discontinued. Patient under hospice services at home for advanced COPD.  Left lower lobe spiculated Pulmonary nodule Seen by pulmonology. Their input is appreciated. I agree that the pursuing diagnoses of this nodule, considering his advanced COPD, is unlikely to provide information that will change his long-term outcome. He is more likely to die of other causes rather than this abnormality.   Abdominal distention X-ray does not show any obstruction. Abdomen is soft. Continue to monitor.  Low TSH Free T4 is  pending.  Normocytic Anemia  This is chronic. Hemoglobin is at baseline.  History of Sickle cell disease Currently stable  Hypokalemia Corrected.  DVT Prophylaxis: Lovenox    Code Status: DO NOT RESUSCITATE  Family Communication: Discussed with the patient. Discussed with his son, Anthony [name]. Disposition Plan: Will remain in step down.    LOS: 2 days   [facility] Pager [phone] 02/09/2015, 7:30 AM  If 7PM-7AM, please contact night-coverage at [email or web address], password [facility]

## 2015-02-09 NOTE — Progress Notes (Signed)
NP Kathline Magic notified due to worsening in Pt's condition. Patient now has AMS and is very lethargic which is a change from AM shift. Patient has received no sedatives or narcotics. Patient is very labored but resting comfortably. Nurse concerned about patients drive to breath since patient has been on 55% FiO2 Ventimask and BiPAP for several days. Blood Gas ordered by NP. Will callback with results. Will continue to monitor.

## 2015-02-10 ENCOUNTER — Inpatient Hospital Stay (HOSPITAL_COMMUNITY): Payer: Medicare Other

## 2015-02-10 DIAGNOSIS — J9602 Acute respiratory failure with hypercapnia: Secondary | ICD-10-CM

## 2015-02-10 MED ORDER — METOPROLOL TARTRATE 1 MG/ML IV SOLN
5.0000 mg | Freq: Once | INTRAVENOUS | Status: AC
  Administered 2015-02-10: 5 mg via INTRAVENOUS
  Filled 2015-02-10: qty 5

## 2015-02-10 MED ORDER — METOPROLOL TARTRATE 1 MG/ML IV SOLN
INTRAVENOUS | Status: AC
  Filled 2015-02-10: qty 5

## 2015-02-10 MED ORDER — SODIUM CHLORIDE 0.9 % IV BOLUS (SEPSIS): 500.0000 mL | Freq: Once | INTRAVENOUS | Status: DC

## 2015-02-10 MED ORDER — LORAZEPAM 2 MG/ML IJ SOLN
1.0000 mg | Freq: Once | INTRAMUSCULAR | Status: AC
  Administered 2015-02-10: 1 mg via INTRAVENOUS

## 2015-02-10 MED ORDER — HALOPERIDOL LACTATE 5 MG/ML IJ SOLN: 2.0000 mg | Freq: Four times a day (QID) | INTRAMUSCULAR | Status: DC | PRN

## 2015-02-10 MED ORDER — LORAZEPAM 2 MG/ML IJ SOLN
INTRAMUSCULAR | Status: AC
  Administered 2015-02-10: 1 mg via INTRAVENOUS
  Filled 2015-02-10: qty 1

## 2015-02-10 MED ORDER — SODIUM CHLORIDE 0.9 % IV SOLN
INTRAVENOUS | Status: DC
  Administered 2015-02-10 (×2): via INTRAVENOUS

## 2015-02-10 MED ORDER — HALOPERIDOL LACTATE 5 MG/ML IJ SOLN
2.0000 mg | Freq: Once | INTRAMUSCULAR | Status: AC
  Administered 2015-02-10: 2 mg via INTRAVENOUS
  Filled 2015-02-10: qty 1

## 2015-02-10 NOTE — Progress Notes (Addendum)
[facility] PROGRESS NOTE  Anthony [name] OMV:[id number] DOB: 04-21-1936 DOA: 02/07/2015  PCP: Philis Fendt, MD  Brief HPI: 79 year old African-American male brought in due to worsening shortness of breath. He was admitted with COPD exacerbation and placed on BiPAP. He is receiving hospice services at home due to his advanced COPD. Patient has required BiPAP on and off during this hospitalization.  Past medical history:  Past Medical History  Diagnosis Date  . COPD (chronic obstructive pulmonary disease)   . Sickle cell anemia   . Pulmonary embolism     Consultants: Pulmonology  Procedures: None  Antibiotics: Doxycycline  Subjective: Patient had a rough night. He got agitated per the RN. He had to be placed back on the BiPAP due to hypercapnia. He is somewhat less responsive than he has been previously but denies any pain.  Objective: Vital Signs  Filed Vitals:   02/10/15 0301 02/10/15 0310 02/10/15 0400 02/10/15 0600  BP: 136/83 136/83 106/57 101/63  Pulse: 119 116 112   Temp:   97 F (36.1 C)   TempSrc:   Axillary   Resp: 24 25 21 22   Height:      Weight:   55 kg (121 lb 4.1 oz)   SpO2: 100% 100% 100% 100%    Intake/Output Summary (Last 24 hours) at 02/10/15 0713 Last data filed at 02/10/15 0600  Gross per 24 hour  Intake      0 ml  Output   1675 ml  Net  -1675 ml   Filed Weights   02/07/15 2153 02/10/15 0400  Weight: 56 kg (123 lb 7.3 oz) 55 kg (121 lb 4.1 oz)    General appearance: On BiPAP. Less responsive, but easily arousable. Opens eyes. Left pupil noted to be larger compared to right. Resp: Decreased air entry bilaterally with scattered wheezes. No rhonchi. No crackles. Cardio: regular rate and rhythm, S1, S2 normal, no murmur, click, rub or gallop GI: soft, non-tender; bowel sounds normal; no masses,  no organomegaly Extremities: extremities normal, atraumatic, no cyanosis or edema Neurologic: Alert. No focal deficits.  Lab  Results:  Basic Metabolic Panel:  Recent Labs Lab 02/07/15 1600 02/08/15 0350 02/09/15 0400 02/10/15 0412  NA 140 142 142 145  K 3.3* 4.8 5.0 4.5  CL 90* 93* 94* 95*  CO2 41* 41* 41* 43*  GLUCOSE 142* 142* 127* 142*  BUN 15 14 24* 28*  CREATININE 1.43* 1.34 1.12 0.95  CALCIUM 9.2 9.2 9.5 10.2  MG  --  2.3  --   --   PHOS  --  4.6  --   --    Liver Function Tests:  Recent Labs Lab 02/08/15 0350  AST 35  ALT 13  ALKPHOS 50  BILITOT 0.8  PROT 5.9*  ALBUMIN 3.5   CBC:  Recent Labs Lab 02/07/15 1600 02/08/15 0350 02/09/15 0400  WBC 7.2 4.3 7.0  HGB 9.0* 9.0* 9.6*  HCT 26.6* 26.1* 28.7*  MCV 90.2 90.6 90.0  PLT 168 114* 144*    Recent Results (from the past 240 hour(s))  MRSA PCR Screening     Status: None   Collection Time: 02/07/15 10:05 PM  Result Value Ref Range Status   MRSA by PCR NEGATIVE NEGATIVE Final    Comment:        The GeneXpert MRSA Assay (FDA approved for NASAL specimens only), is one component of a comprehensive MRSA colonization surveillance program. It is not intended to diagnose MRSA infection nor to guide or  monitor treatment for MRSA infections.       Studies/Results: No results found.  Medications:  Scheduled: . budesonide-formoterol  2 puff Inhalation BID  . docusate sodium  100 mg Oral BID  . doxycycline  100 mg Oral Q12H  . enoxaparin (LOVENOX) injection  40 mg Subcutaneous Q24H  . feeding supplement (GLUCERNA SHAKE)  237 mL Oral TID WC & HS  . ferrous sulfate  325 mg Oral BID WC  . folic acid  449 mcg Oral Daily  . guaiFENesin  600 mg Oral BID  . ipratropium-albuterol  3 mL Nebulization QID  . loratadine  10 mg Oral Daily  . methylPREDNISolone (SOLU-MEDROL) injection  60 mg Intravenous Q6H  . pantoprazole  40 mg Oral Q1200  . senna  2 tablet Oral QHS  . sodium chloride  3 mL Intravenous Q12H   Continuous: . sodium chloride    . albuterol 10 mg/hr (02/07/15 1655)   EEF:[id number] **OR** acetaminophen,  albuterol, bisacodyl, guaiFENesin-dextromethorphan, HYDROcodone-acetaminophen, LORazepam, morphine injection, ondansetron **OR** ondansetron (ZOFRAN) IV, polyethylene glycol, sodium phosphate  Assessment/Plan:  Active Problems:   Sickle cell disease   PULMONARY EMBOLISM, HX OF   Anemia   COPD exacerbation   Hypokalemia   Pulmonary nodule   Incidental lung nodule, greater than or equal to 42mm    Acute COPD exacerbation Patient had a setback last night. His ABG showed worsening acidosis with hypercapnia. He had to be placed back on the BiPAP. He had been on a venti mask all day yesterday. He required Haldol for agitation. His pupils are unequal. Unclear if this is an old finding or not. We will proceed with CT of the head. No falls or injuries reported. We have attempted to take the patient off of BiPAP over the last 2 days. We have not been very successful. We will see how he does over the course of next 24-hour hours. If he is BiPAP dependent, then we may have to talk to family about this issue and at that point we may need to involve palliative medicine. Continue with steroids, nebulizer treatments, antibiotics. Continue with inhaled steroids as well. Influenza PCR was negative. Tamiflu has been discontinued. Patient under hospice services at home for advanced COPD.  Left lower lobe spiculated Pulmonary nodule Seen by pulmonology. Their input is appreciated. I agree that the pursuing diagnoses of this nodule, considering his advanced COPD, is unlikely to provide information that will change his long-term outcome. He is more likely to die of other causes rather than this abnormality.   Abdominal distention X-ray does not show any obstruction. Abdomen is soft. Continue to monitor.  Low TSH Free T4 is normal.  Normocytic Anemia  This is chronic. Hemoglobin is at baseline.  History of Sickle cell disease Currently stable  Hypokalemia/mildly elevated creatinine Corrected. Unable to  determine if there was any acute renal dysfunction.   DVT Prophylaxis: Lovenox    Code Status: DO NOT RESUSCITATE  Family Communication: Discussed with the patient. Discussed with his son 4/10. Disposition Plan: Will remain in step down. CT head today.    LOS: 3 days   [place] [facility] Pager [phone] 02/10/2015, 7:13 AM  If 7PM-7AM, please contact night-coverage at [email or web address], password [facility]

## 2015-02-10 NOTE — Clinical Documentation Improvement (Signed)
Potassium 1.43 02/07/2015; GFR 53.  (Labs below)  Please identify any clinical conditions associated with the abnormal renal labs and document in your progress note and carry over to the discharge summary.    Component      BUN Creatinine  Latest Ref Rng      6 - 23 mg/dL 0.50 - 1.35 mg/dL  02/07/2015     4:00 PM 15 1.43 (H)  02/08/2015      14 1.34  02/09/2015      24 (H) 1.12  02/10/2015      28 (H) 0.95   Component      EGFR (African American)  Latest Ref Rng      >90 mL/min  02/07/2015     4:00 PM 53 (L)  02/08/2015      58 (L)  02/09/2015      72 (L)  02/10/2015      >90   Possible Clinical Conditions: -Acute kidney injury / acute kidney failure -Other condition (please specify) -Unable to determine  Thank you, Mateo Flow, RN [phone] Clinical Documentation Specialist

## 2015-02-10 NOTE — Progress Notes (Signed)
Pt setting up in bed tripoding, states he is feeling short of breath.  Place Pt back on BiPAP and resting comfortably now.  Pt stable and RR decreasing.

## 2015-02-10 NOTE — Progress Notes (Signed)
INITIAL NUTRITION ASSESSMENT  DOCUMENTATION CODES Per approved criteria  -Severe malnutrition in the context of chronic illness -Underweight   INTERVENTION: Continue current diet and Glucerna Shake po TID, each supplement provides 220 kcal and 10 grams of protein  RD to continue to monitor for needs  NUTRITION DIAGNOSIS: Severe malnutrition related to chronic illness, end-stage COPD as evidenced by severe muscle and moderate fat wasting.   Goal: Pt to meet >/=75% needs initially  Monitor:  POC and if TF needed/warranted for hospice pt Intakes of meals and supplements, weight trends, labs  Reason for Assessment: MST, underweight status  79 y.o. male  Admitting Dx: Acute COPD exacerbation, LLL spiculated pulmonary nodule  ASSESSMENT: 79 y.o. male with hx which includes end-stage COPD with home hospice services. Per rounds this AM, pt with very recent dx of lung cancer and at that time it was felt that family had still not informed pt of this. Pt has been on BiPAP PRN since admission. Pt is DNR.  Pt seen for MST: 3 and underweight status. Per chart review, pt ate 20% of lunch 02/08/15 with no other intakes documented; unable to locate pt's RN at this time. Pt currently on ventimask but has been on PRN BiPAP as well. He arouses very briefly during physical assessment but unable to provide information and no family/visitors present. Pt has Glucerna Shake ordered TID at this time. Pt likely not meeting needs.   Per critical care note 4/9: current nodule potentially curable by lobectomy but contraindicated given pt's resp status. It is also potentially curable with radiation but bronch/biopsy would be needed first and pt may not be stable enough for this procedure.  Nutrition Focused Physical Exam:  Subcutaneous Fat:  Orbital Region: mild/moderate depletion Upper Arm Region: mild/moderate depletion Thoracic and Lumbar Region: severe depletion  Muscle:  Temple Region: mild/moderate  depletion Clavicle Bone Region: severe depletion Clavicle and Acromion Bone Region: severe depletion Scapular Bone Region: severe depletion Dorsal Hand: severe depletion Patellar Region: severe depletion Anterior Thigh Region: unable to assess at this time Posterior Calf Region: severe depletion  Edema: none present at this time  Labs reviewed; Cl low, BUN elevated.    Height: Ht Readings from Last 1 Encounters:  02/07/15 5\' 10"  (1.778 m)    Weight: Wt Readings from Last 1 Encounters:  02/10/15 121 lb 4.1 oz (55 kg)    Ideal Body Weight: 166 lbs (75.45 kg)  % Ideal Body Weight: 73%  Wt Readings from Last 10 Encounters:  02/10/15 121 lb 4.1 oz (55 kg)  06/25/14 93 lb 11.1 oz (42.5 kg)  06/10/14 106 lb 0.7 oz (48.1 kg)  03/09/13 115 lb 15.4 oz (52.6 kg)  12/29/12 101 lb 13.6 oz (46.2 kg)  04/10/09 136 lb 8 oz (61.916 kg)    Usual Body Weight: unable to confirm at this time  % Usual Body Weight: unable to assess at this time  BMI:  Body mass index is 17.4 kg/(m^2).  Estimated Nutritional Needs: Kcal: 1650-1850 Protein: 85-105 grams Fluid: 2L/day  Skin: WDL  Diet Order: Diet Heart Room service appropriate?: Yes; Fluid consistency:: Thin  EDUCATION NEEDS: -No education needs identified at this time   Intake/Output Summary (Last 24 hours) at 02/10/15 1428 Last data filed at 02/10/15 1300  Gross per 24 hour  Intake 210.83 ml  Output    875 ml  Net -664.17 ml    Last BM: PTA   Labs:   Recent Labs Lab 02/08/15 0350 02/09/15 0400 02/10/15 0412  NA 142 142 145  K 4.8 5.0 4.5  CL 93* 94* 95*  CO2 41* 41* 43*  BUN 14 24* 28*  CREATININE 1.34 1.12 0.95  CALCIUM 9.2 9.5 10.2  MG 2.3  --   --   PHOS 4.6  --   --   GLUCOSE 142* 127* 142*    CBG (last 3)  No results for input(s): GLUCAP in the last 72 hours.  Scheduled Meds: . budesonide-formoterol  2 puff Inhalation BID  . docusate sodium  100 mg Oral BID  . doxycycline  100 mg Oral Q12H  .  enoxaparin (LOVENOX) injection  40 mg Subcutaneous Q24H  . feeding supplement (GLUCERNA SHAKE)  237 mL Oral TID WC & HS  . ferrous sulfate  325 mg Oral BID WC  . folic acid  016 mcg Oral Daily  . guaiFENesin  600 mg Oral BID  . ipratropium-albuterol  3 mL Nebulization QID  . loratadine  10 mg Oral Daily  . methylPREDNISolone (SOLU-MEDROL) injection  60 mg Intravenous Q6H  . pantoprazole  40 mg Oral Q1200  . senna  2 tablet Oral QHS  . sodium chloride  3 mL Intravenous Q12H    Continuous Infusions: . sodium chloride 50 mL/hr at 02/10/15 1300  . albuterol 10 mg/hr (02/07/15 1655)    Past Medical History  Diagnosis Date  . COPD (chronic obstructive pulmonary disease)   . Sickle cell anemia   . Pulmonary embolism     Past Surgical History  Procedure Laterality Date  . No past surgeries    . Colonoscopy N/A 03/09/2013    Procedure: COLONOSCOPY;  Surgeon: Beryle Beams, MD;  Location: [facility];  Service: Endoscopy;  Laterality: N/A;  . Esophagogastroduodenoscopy N/A 03/09/2013    Procedure: ESOPHAGOGASTRODUODENOSCOPY (EGD);  Surgeon: Beryle Beams, MD;  Location: [facility] ENDOSCOPY;  Service: Endoscopy;  Laterality: N/A;    Jarome Matin, RD, LDN Inpatient Clinical Dietitian Pager # [phone] After hours/weekend pager # [phone]

## 2015-02-11 DIAGNOSIS — J9601 Acute respiratory failure with hypoxia: Secondary | ICD-10-CM | POA: Diagnosis present

## 2015-02-11 DIAGNOSIS — E875 Hyperkalemia: Secondary | ICD-10-CM

## 2015-02-11 DIAGNOSIS — J9602 Acute respiratory failure with hypercapnia: Secondary | ICD-10-CM

## 2015-02-11 LAB — BASIC METABOLIC PANEL
Anion gap: 7 (ref 5–15)
Anion gap: 11 (ref 5–15)
BUN: 28 mg/dL — ABNORMAL HIGH (ref 6–23)
BUN: 30 mg/dL — AB (ref 6–23)
CALCIUM: 10.2 mg/dL (ref 8.4–10.5)
CALCIUM: 10.5 mg/dL (ref 8.4–10.5)
CO2: 43 mmol/L (ref 19–32)
CO2: 37 mmol/L — ABNORMAL HIGH (ref 19–32)
CREATININE: 0.91 mg/dL (ref 0.50–1.35)
Chloride: 95 mmol/L — ABNORMAL LOW (ref 96–112)
Chloride: 100 mmol/L (ref 96–112)
Creatinine, Ser: 0.95 mg/dL (ref 0.50–1.35)
GFR calc Af Amer: >90 mL/min (ref >90)
GFR calc non Af Amer: 79 mL/min — ABNORMAL LOW (ref >90)
GFR, EST NON AFRICAN AMERICAN: 80 mL/min — AB (ref >90)
GLUCOSE: 142 mg/dL — AB (ref 70–99)
GLUCOSE: 116 mg/dL — AB (ref 70–99)
POTASSIUM: 4.5 mmol/L (ref 3.5–5.1)
Potassium: 5.3 mmol/L — ABNORMAL HIGH (ref 3.5–5.1)
Sodium: 145 mmol/L (ref 135–145)
Sodium: 148 mmol/L — ABNORMAL HIGH (ref 135–145)

## 2015-02-11 LAB — CBC
HCT: 32.7 % — ABNORMAL LOW (ref 39.0–52.0)
Hemoglobin: 10.8 g/dL — ABNORMAL LOW (ref 13.0–17.0)
MCH: 29.9 pg (ref 26.0–34.0)
MCHC: 33 g/dL (ref 30.0–36.0)
MCV: 90.6 fL (ref 78.0–100.0)
Platelets: 138 10*3/uL — ABNORMAL LOW (ref 150–400)
RBC: 3.61 MIL/uL — ABNORMAL LOW (ref 4.22–5.81)
RDW: 19.5 % — ABNORMAL HIGH (ref 11.5–15.5)
WBC: 7.9 10*3/uL (ref 4.0–10.5)

## 2015-02-11 LAB — CLOSTRIDIUM DIFFICILE BY PCR: Toxigenic C. Difficile by PCR: NEGATIVE

## 2015-02-11 MED ORDER — MORPHINE SULFATE (CONCENTRATE) 10 MG/0.5ML PO SOLN
5.0000 mg | Freq: Four times a day (QID) | ORAL | Status: DC
  Administered 2015-02-11 – 2015-02-12 (×3): 5 mg via ORAL
  Filled 2015-02-11 (×3): qty 0.5

## 2015-02-11 MED ORDER — MORPHINE SULFATE (CONCENTRATE) 10 MG/0.5ML PO SOLN: 10.0000 mg | ORAL | Status: DC

## 2015-02-11 MED ORDER — BUDESONIDE 0.5 MG/2ML IN SUSP
0.5000 mg | Freq: Two times a day (BID) | RESPIRATORY_TRACT | Status: DC
  Administered 2015-02-11 – 2015-02-16 (×11): 0.5 mg via RESPIRATORY_TRACT
  Filled 2015-02-11 (×10): qty 2

## 2015-02-11 MED ORDER — IPRATROPIUM BROMIDE 0.02 % IN SOLN
0.5000 mg | Freq: Four times a day (QID) | RESPIRATORY_TRACT | Status: DC
  Administered 2015-02-11 – 2015-02-16 (×19): 0.5 mg via RESPIRATORY_TRACT
  Filled 2015-02-11 (×19): qty 2.5

## 2015-02-11 MED ORDER — SODIUM POLYSTYRENE SULFONATE 15 GM/60ML PO SUSP
15.0000 g | Freq: Once | ORAL | Status: AC
  Administered 2015-02-11: 15 g via ORAL
  Filled 2015-02-11: qty 60

## 2015-02-11 MED ORDER — METHYLPREDNISOLONE SODIUM SUCC 125 MG IJ SOLR
60.0000 mg | Freq: Two times a day (BID) | INTRAMUSCULAR | Status: DC
  Administered 2015-02-11 – 2015-02-16 (×10): 60 mg via INTRAVENOUS
  Filled 2015-02-11: qty 2
  Filled 2015-02-11 (×5): qty 0.96
  Filled 2015-02-11: qty 2
  Filled 2015-02-11: qty 0.96
  Filled 2015-02-11 (×3): qty 2
  Filled 2015-02-11: qty 0.96

## 2015-02-11 MED ORDER — MORPHINE SULFATE 2 MG/ML IJ SOLN
1.0000 mg | INTRAMUSCULAR | Status: DC | PRN
  Administered 2015-02-11 (×2): 1 mg via INTRAVENOUS
  Filled 2015-02-11: qty 1

## 2015-02-11 MED ORDER — ALBUTEROL SULFATE (2.5 MG/3ML) 0.083% IN NEBU
2.5000 mg | INHALATION_SOLUTION | RESPIRATORY_TRACT | Status: DC
  Administered 2015-02-11 – 2015-02-16 (×38): 2.5 mg via RESPIRATORY_TRACT
  Filled 2015-02-11 (×38): qty 3

## 2015-02-11 NOTE — Progress Notes (Signed)
[facility] PROGRESS NOTE  Anthony [name] KZS:[id number] DOB: 1936/09/03 DOA: 02/07/2015  PCP: Philis Fendt, MD  Brief HPI: 79 year old African-American male brought in due to worsening shortness of breath. He was admitted with COPD exacerbation and placed on BiPAP. He is receiving hospice services at home due to his advanced COPD. Patient has required BiPAP on and off during this hospitalization. It has been difficult to wean him off of the BiPAP. Requesting pulmonology input in this matter.  Past medical history:  Past Medical History  Diagnosis Date  . COPD (chronic obstructive pulmonary disease)   . Sickle cell anemia   . Pulmonary embolism     Consultants: Pulmonology  Procedures: None  Antibiotics: Doxycycline  Subjective: Once again, last night patient got short of breath, tachycardic. He had to be placed on BiPAP. He feels better this morning. Requesting orange juice. Denies any pain. No nausea, vomiting.   Objective: Vital Signs  Filed Vitals:   02/11/15 0200 02/11/15 0339 02/11/15 0407 02/11/15 0600  BP: 160/85 160/85 162/96 156/96  Pulse: 102 97 117 102  Temp:   97.4 F (36.3 C)   TempSrc:   Axillary   Resp: 24 31 30 24   Height:      Weight:      SpO2: 100% 100% 100% 100%    Intake/Output Summary (Last 24 hours) at 02/11/15 0754 Last data filed at 02/11/15 0700  Gross per 24 hour  Intake 1110.83 ml  Output   1250 ml  Net -139.17 ml   Filed Weights   02/07/15 2153 02/10/15 0400  Weight: 56 kg (123 lb 7.3 oz) 55 kg (121 lb 4.1 oz)    General appearance: On BiPAP. More awake and alert than yesterday morning.  Resp: decreased air entry bilaterally. No wheezing appreciated today. No rhonchi or crackles.  Cardio: regular rate and rhythm, S1, S2 normal, no murmur, click, rub or gallop GI: soft, non-tender; bowel sounds normal; no masses,  no organomegaly Extremities: extremities normal, atraumatic, no cyanosis or edema Neurologic: Alert.  No focal deficits.  Lab Results:  Basic Metabolic Panel:  Recent Labs Lab 02/07/15 1600 02/08/15 0350 02/09/15 0400 02/10/15 0412 02/11/15 0340  NA 140 142 142 145 148*  K 3.3* 4.8 5.0 4.5 5.3*  CL 90* 93* 94* 95* 100  CO2 41* 41* 41* 43* 37*  GLUCOSE 142* 142* 127* 142* 116*  BUN 15 14 24* 28* 30*  CREATININE 1.43* 1.34 1.12 0.95 0.91  CALCIUM 9.2 9.2 9.5 10.2 10.5  MG  --  2.3  --   --   --   PHOS  --  4.6  --   --   --    Liver Function Tests:  Recent Labs Lab 02/08/15 0350  AST 35  ALT 13  ALKPHOS 50  BILITOT 0.8  PROT 5.9*  ALBUMIN 3.5   CBC:  Recent Labs Lab 02/07/15 1600 02/08/15 0350 02/09/15 0400 02/11/15 0340  WBC 7.2 4.3 7.0 7.9  HGB 9.0* 9.0* 9.6* 10.8*  HCT 26.6* 26.1* 28.7* 32.7*  MCV 90.2 90.6 90.0 90.6  PLT 168 114* 144* 138*    Recent Results (from the past 240 hour(s))  MRSA PCR Screening     Status: None   Collection Time: 02/07/15 10:05 PM  Result Value Ref Range Status   MRSA by PCR NEGATIVE NEGATIVE Final    Comment:        The GeneXpert MRSA Assay (FDA approved for NASAL specimens only), is one component of  a comprehensive MRSA colonization surveillance program. It is not intended to diagnose MRSA infection nor to guide or monitor treatment for MRSA infections.   Clostridium Difficile by PCR     Status: None   Collection Time: 02/11/15 12:53 AM  Result Value Ref Range Status   C difficile by pcr NEGATIVE NEGATIVE Final      Studies/Results: Ct Head Wo Contrast  02/10/2015   CLINICAL DATA:  Altered mental status.  Sickle cell anemia.  EXAM: CT HEAD WITHOUT CONTRAST  TECHNIQUE: Contiguous axial images were obtained from the base of the skull through the vertex without intravenous contrast.  COMPARISON:  CT head without contrast 06/04/2014  FINDINGS: Atrophy and diffuse white matter changes are again noted. There is a remote lacunar infarct within the right thalamus. Basal ganglia are otherwise intact. The insular cortex  is intact bilaterally. Acute cortical defect is evident.  Postsurgical changes are again noted at the anterior wall right maxillary sinus. The paranasal sinuses and mastoid air cells are otherwise clear. The calvarium is intact. Scleral banding is noted on the right. The globes and orbits are otherwise intact.  IMPRESSION: 1. Stable atrophy and white matter disease. 2. No acute intracranial abnormality.   Electronically Signed   By: San Morelle M.D.   On: 02/10/2015 13:11    Medications:  Scheduled: . budesonide-formoterol  2 puff Inhalation BID  . docusate sodium  100 mg Oral BID  . doxycycline  100 mg Oral Q12H  . enoxaparin (LOVENOX) injection  40 mg Subcutaneous Q24H  . feeding supplement (GLUCERNA SHAKE)  237 mL Oral TID WC & HS  . ferrous sulfate  325 mg Oral BID WC  . folic acid  696 mcg Oral Daily  . guaiFENesin  600 mg Oral BID  . ipratropium-albuterol  3 mL Nebulization QID  . loratadine  10 mg Oral Daily  . methylPREDNISolone (SOLU-MEDROL) injection  60 mg Intravenous Q6H  . metoprolol      . pantoprazole  40 mg Oral Q1200  . senna  2 tablet Oral QHS  . sodium chloride  3 mL Intravenous Q12H   Continuous: . sodium chloride 50 mL/hr at 02/11/15 0700  . albuterol 10 mg/hr (02/07/15 1655)   VEL:FYBOFBPZWCHEN **OR** acetaminophen, albuterol, bisacodyl, guaiFENesin-dextromethorphan, haloperidol lactate, HYDROcodone-acetaminophen, LORazepam, morphine injection, ondansetron **OR** ondansetron (ZOFRAN) IV, polyethylene glycol, sodium phosphate  Assessment/Plan:  Active Problems:   Sickle cell disease   PULMONARY EMBOLISM, HX OF   Anemia   COPD exacerbation   Hypokalemia   Pulmonary nodule   Incidental lung nodule, greater than or equal to 51mm    Acute COPD exacerbation Patient had to be placed back on BiPAP again last night for increased work of breathing. This is becoming an ongoing issue. Unable to completely wean him off of BiPAP. Will request pulmonology is to  provide their input in this matter. May need to discuss with family. Depending on pulmonology input. May need to involve palliative medicine. CT head was unremarkable for acute process. 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 For now continue with steroids, nebulizer treatments, antibiotics. Continue with inhaled steroids as well. Influenza PCR was negative. Patient under hospice services at home for advanced COPD.  Left lower lobe spiculated Pulmonary nodule Seen by pulmonology. Their input is appreciated. I agree that the pursuing diagnoses of this nodule, considering his advanced COPD, is unlikely to provide information that will change his long-term outcome. He is more likely to die of other causes rather than this abnormality.  Abdominal distention X-ray does not show any obstruction. Abdomen is soft. Continue to monitor.  Low TSH Free T4 is normal.  Normocytic Anemia  This is chronic. Hemoglobin is at baseline.  History of Sickle cell disease Currently stable  Hyperkalemia/mildly elevated creatinine He was initially hypokalemic. Perhaps this was overcorrected. We'll give him a small dose of Kayexalate. Unable to determine if there was any acute renal dysfunction.   DVT Prophylaxis: Lovenox    Code Status: DO NOT RESUSCITATE  Family Communication: Discussed with the patient. Discussed with his son. Disposition Plan: Will remain in step down. Await pulmonology input.    LOS: 4 days   [facility] Pager [phone] 02/11/2015, 7:54 AM  If 7PM-7AM, please contact night-coverage at [email or web address], password [facility]

## 2015-02-11 NOTE — Progress Notes (Signed)
CSW continuing to follow.  Pt admitted from home with [facility] and CSW consult placed to follow for potential disposition needs.   Per chart, pt requiring BiPap at this time. Per chart, Palliative Medicine consulted today for goals of care.  CSW to follow up on recommendations from [facility] [place] meeting and assist with disposition planning as appropriate.  Alison Murray, MSW, [place] [facility] [phone]

## 2015-02-11 NOTE — Progress Notes (Signed)
PULMONARY / CRITICAL CARE MEDICINE   Name: Anthony [name] MRN: [id number] DOB: [birth year]    ADMISSION DATE:  02/07/2015 CONSULTATION DATE:  02/08/15   REFERRING MD :  DR Bonnielee Haff and Dr Roswell Nickel  CHIEF COMPLAINT:  Lung nodule; asked to see again on 4/12 for persistent BIPAP dependence    HISTORY OF PRESENT ILLNESS:   79 year old debilitated copd patient wtuih chronic resp failure, 3L o2, lives at  Home on home hospice. He is DNR/DNI. Admitted initially on 4/8 w/ working dx of AECOPD. We were initially consulted 4/9 for lung nodule. Treatment as of 4/12 had included: O2, NIPPV, scheduled BDs, and systemic steroids. In spite of these measures as of 4/12 he continued to require intermittent NIPPV. PCCM asked to re-eval to make further recommendations.     Review of his CT chest shows 2010 CT - no nodule in LLL LB6 area 2015 august - emergence of a nodule in [facility] area 2016 April - spiculated LB6 area 1.4cm nodule   VITAL SIGNS: Temp:  [97.4 F (36.3 C)-98.4 F (36.9 C)] 97.8 F (36.6 C) (04/12 0800) Pulse Rate:  [35-143] 121 (04/12 0800) Resp:  [18-32] 28 (04/12 1000) BP: (110-195)/(50-96) 146/76 mmHg (04/12 1000) SpO2:  [96 %-100 %] 100 % (04/12 0800) FiO2 (%):  [40 %] 40 % (04/11 1940) Currently 5 liters     VENTILATOR SETTINGS: Vent Mode:  [-]  FiO2 (%):  [40 %] 40 % INTAKE / OUTPUT:  Intake/Output Summary (Last 24 hours) at 02/11/15 1032 Last data filed at 02/11/15 0700  Gross per 24 hour  Intake   1050 ml  Output   1250 ml  Net   -200 ml   Subjective Feels very SOB. Was placed on NIPPV earlier today given dyspnea and anxiety. Did better w/ this. Now off BIPAP and w/ marked accessory muscle use speaking in short 1 word phrases.  PHYSICAL EXAMINATION: General:  Debilitated, frail, + accessory muscle use  Neuro:  Awake, oriented X 2. Appropriate. Moves all ext. Verbalizes his relationship w/ home hospice.  HEENT:  NEck supple Cardiovascular:  Normal heart  sounds. No murmurs Lungs:  Barrell chest +. Marked prolonged exp wheeze. + upper airway wheeze, some trouble w/ cough clearance  Abdomen:  Soft. No mass. Normal bowel sounds Musculoskeletal:  No cyanosis.No clubbing. No edema Skin:  Intact anteriorly  LABS:  Recent Labs Lab 02/07/15 1653 02/07/15 2008 02/09/15 2005  PHART 7.451* 7.380 7.349*  PCO2ART 61.8* 70.0* 78.0*  PO2ART 42.5* 57.7* 171.0*  HCO3 42.4* 40.4* 41.9*  TCO2 39.5 38.3 39.5  O2SAT 77.9 87.0 99.8    Recent Labs Lab 02/08/15 0350 02/09/15 0400 02/11/15 0340  HGB 9.0* 9.6* 10.8*  HCT 26.1* 28.7* 32.7*  WBC 4.3 7.0 7.9  PLT 114* 144* 138*    Recent Labs Lab 02/07/15 1600 02/08/15 0350 02/09/15 0400 02/10/15 0412 02/11/15 0340  NA 140 142 142 145 148*  K 3.3* 4.8 5.0 4.5 5.3*  CL 90* 93* 94* 95* 100  CO2 41* 41* 41* 43* 37*  GLUCOSE 142* 142* 127* 142* 116*  BUN 15 14 24* 28* 30*  CREATININE 1.43* 1.34 1.12 0.95 0.91  CALCIUM 9.2 9.2 9.5 10.2 10.5  MG  --  2.3  --   --   --   PHOS  --  4.6  --   --   --    Estimated Creatinine Clearance: 53.7 mL/min (by C-G formula based on Cr of 0.91).  IMAGING  x48h Ct Head Wo Contrast  02/10/2015   CLINICAL DATA:  Altered mental status.  Sickle cell anemia.  EXAM: CT HEAD WITHOUT CONTRAST  TECHNIQUE: Contiguous axial images were obtained from the base of the skull through the vertex without intravenous contrast.  COMPARISON:  CT head without contrast 06/04/2014  FINDINGS: Atrophy and diffuse white matter changes are again noted. There is a remote lacunar infarct within the right thalamus. Basal ganglia are otherwise intact. The insular cortex is intact bilaterally. Acute cortical defect is evident.  Postsurgical changes are again noted at the anterior wall right maxillary sinus. The paranasal sinuses and mastoid air cells are otherwise clear. The calvarium is intact. Scleral banding is noted on the right. The globes and orbits are otherwise intact.  IMPRESSION: 1.  Stable atrophy and white matter disease. 2. No acute intracranial abnormality.   Electronically Signed   By: San Morelle M.D.   On: 02/10/2015 13:11       ASSESSMENT / PLAN: Acute on Chronic respiratory failure in the setting of AECOPD LLL LB6 nodule   - Absent in 2010.  New in august 2015. Grown to 1.4cm April 2016 with spiculation  - This is high pre-test probl for stage 1A NSCLC  - Left alone - natural course of this disease is > 1year to several years Mild Hypernatremia Mild hyperkalemia   Discussion Not improving w/ current rx in-spite of aggressive Bronchodilator therapy, systemic steroids, and ABX. He has what is very likely End-stage COPD, and was already utilizing oral morphine from time to time on home hospice. At this point there is very little to offer beyond symptomatic support. He says he does not care for the NIPPV, yet clinically he appears more comfortable on this. It is absolutely appropriate to involve Palliative to support [place] in symptom management.    Plan:   Add IV morphine PRN for break-thru pain and scheduled Roxanol (w/ hold for sedation orders) Change BD regimen to Albuterol q3, atrovent q6, and budesonide Q 12 D/c symbicort. Don't think he can effectively take this Cont BIPAP PRN, but hope we can transition him off this w/ above It's time to involve palliative. Absolutely appropriate to focus on comfort at ALL times w/ this patient.   Erick Colace ACNP-BC [place] Pager # [phone] OR # [phone] if no answer

## 2015-02-11 NOTE — Progress Notes (Signed)
Pt removed from BiPAP this am at 0730 per direction of the [facility]. Placed on 6L/[place]. Will continue to monitor.

## 2015-02-11 NOTE — Progress Notes (Signed)
Pt placed back on BiPAP per order of the critical care/ pulmonalogy team. Asked RT to come assess on her. Will continue to monitor.

## 2015-02-12 DIAGNOSIS — Z515 Encounter for palliative care: Secondary | ICD-10-CM | POA: Diagnosis present

## 2015-02-12 DIAGNOSIS — D649 Anemia, unspecified: Secondary | ICD-10-CM

## 2015-02-12 DIAGNOSIS — Z86718 Personal history of other venous thrombosis and embolism: Secondary | ICD-10-CM

## 2015-02-12 DIAGNOSIS — E876 Hypokalemia: Secondary | ICD-10-CM

## 2015-02-12 DIAGNOSIS — R911 Solitary pulmonary nodule: Secondary | ICD-10-CM

## 2015-02-12 DIAGNOSIS — J441 Chronic obstructive pulmonary disease with (acute) exacerbation: Principal | ICD-10-CM

## 2015-02-12 LAB — BASIC METABOLIC PANEL
Anion gap: 7 (ref 5–15)
BUN: 30 mg/dL — AB (ref 6–23)
CHLORIDE: 103 mmol/L (ref 96–112)
CO2: 38 mmol/L — ABNORMAL HIGH (ref 19–32)
CREATININE: 0.93 mg/dL (ref 0.50–1.35)
Calcium: 10.2 mg/dL (ref 8.4–10.5)
GFR calc Af Amer: >90 mL/min (ref >90)
GFR calc non Af Amer: 80 mL/min — ABNORMAL LOW (ref >90)
Glucose, Bld: 108 mg/dL — ABNORMAL HIGH (ref 70–99)
Potassium: 5.2 mmol/L — ABNORMAL HIGH (ref 3.5–5.1)
Sodium: 148 mmol/L — ABNORMAL HIGH (ref 135–145)

## 2015-02-12 MED ORDER — DEXTROSE-NACL 5-0.45 % IV SOLN
INTRAVENOUS | Status: DC
  Administered 2015-02-12: 09:00:00 via INTRAVENOUS

## 2015-02-12 MED ORDER — SODIUM POLYSTYRENE SULFONATE 15 GM/60ML PO SUSP
30.0000 g | Freq: Once | ORAL | Status: AC
Start: 2015-02-12 — End: 2015-02-13
  Administered 2015-02-13: 30 g via ORAL
  Filled 2015-02-12: qty 120

## 2015-02-12 MED ORDER — MORPHINE SULFATE (CONCENTRATE) 10 MG/0.5ML PO SOLN
5.0000 mg | ORAL | Status: DC
  Administered 2015-02-12 – 2015-02-14 (×12): 5 mg via ORAL
  Filled 2015-02-12 (×12): qty 0.5

## 2015-02-12 MED ORDER — MORPHINE SULFATE 2 MG/ML IJ SOLN
2.0000 mg | INTRAMUSCULAR | Status: DC | PRN
  Administered 2015-02-12 – 2015-02-14 (×4): 2 mg via INTRAVENOUS
  Filled 2015-02-12 (×4): qty 1

## 2015-02-12 NOTE — Progress Notes (Signed)
PULMONARY / CRITICAL CARE MEDICINE   Name: Anthony [name] MRN: [id number] DOB: January 23, 1936    ADMISSION DATE:  02/07/2015 CONSULTATION DATE:  02/08/15   REFERRING MD :  DR Bonnielee Haff and Dr Roswell Nickel  CHIEF COMPLAINT:  Lung nodule; asked to see again on 4/12 for persistent BIPAP dependence    HISTORY OF PRESENT ILLNESS:   79 year old debilitated copd patient wtuih chronic resp failure, 3L o2, lives at  Home on home hospice. He is DNR/DNI. Admitted initially on 4/8 w/ working dx of AECOPD. We were initially consulted 4/9 for lung nodule. Treatment as of 4/12 had included: O2, NIPPV, scheduled BDs, and systemic steroids. In spite of these measures as of 4/12 he continued to require intermittent NIPPV. PCCM asked to re-eval to make further recommendations.     Review of his CT chest shows 2010 CT - no nodule in LLL LB6 area 2015 august - emergence of a nodule in [facility] area 2016 April - spiculated LB6 area 1.4cm nodule  SUBJ - Dyspnea slight improved Slept ok on bipap Denies pain afebrile  VITAL SIGNS: Temp:  [97.4 F (36.3 C)-98.4 F (36.9 C)] 97.5 F (36.4 C) (04/13 0700) Pulse Rate:  [28-135] 107 (04/13 0800) Resp:  [17-28] 19 (04/13 0800) BP: (102-167)/(55-93) 115/62 mmHg (04/13 0800) SpO2:  [96 %-100 %] 100 % (04/13 0829) FiO2 (%):  [40 %] 40 % (04/13 0800) Currently 5 liters     VENTILATOR SETTINGS: Vent Mode:  [-]  FiO2 (%):  [40 %] 40 % INTAKE / OUTPUT:  Intake/Output Summary (Last 24 hours) at 02/12/15 0842 Last data filed at 02/12/15 0800  Gross per 24 hour  Intake   1490 ml  Output    295 ml  Net   1195 ml     PHYSICAL EXAMINATION: General:  Debilitated, frail, + accessory muscle use  Neuro:  Awake, oriented X 2. Appropriate. Moves all ext.   HEENT:  NEck supple Cardiovascular:  Normal heart sounds. No murmurs Lungs:  Barrell chest +. Marked prolonged exp wheeze.  some trouble w/ cough clearance  Abdomen:  Soft. No mass. Normal bowel  sounds Musculoskeletal:  No cyanosis.No clubbing. No edema Skin:  Intact anteriorly  LABS:  Recent Labs Lab 02/07/15 1653 02/07/15 2008 02/09/15 2005  PHART 7.451* 7.380 7.349*  PCO2ART 61.8* 70.0* 78.0*  PO2ART 42.5* 57.7* 171.0*  HCO3 42.4* 40.4* 41.9*  TCO2 39.5 38.3 39.5  O2SAT 77.9 87.0 99.8    Recent Labs Lab 02/08/15 0350 02/09/15 0400 02/11/15 0340  HGB 9.0* 9.6* 10.8*  HCT 26.1* 28.7* 32.7*  WBC 4.3 7.0 7.9  PLT 114* 144* 138*    Recent Labs Lab 02/08/15 0350 02/09/15 0400 02/10/15 0412 02/11/15 0340 02/12/15 0330  NA 142 142 145 148* 148*  K 4.8 5.0 4.5 5.3* 5.2*  CL 93* 94* 95* 100 103  CO2 41* 41* 43* 37* 38*  GLUCOSE 142* 127* 142* 116* 108*  BUN 14 24* 28* 30* 30*  CREATININE 1.34 1.12 0.95 0.91 0.93  CALCIUM 9.2 9.5 10.2 10.5 10.2  MG 2.3  --   --   --   --   PHOS 4.6  --   --   --   --    Estimated Creatinine Clearance: 52.6 mL/min (by C-G formula based on Cr of 0.93).  IMAGING x48h Ct Head Wo Contrast  02/10/2015   CLINICAL DATA:  Altered mental status.  Sickle cell anemia.  EXAM: CT HEAD WITHOUT CONTRAST  TECHNIQUE:  Contiguous axial images were obtained from the base of the skull through the vertex without intravenous contrast.  COMPARISON:  CT head without contrast 06/04/2014  FINDINGS: Atrophy and diffuse white matter changes are again noted. There is a remote lacunar infarct within the right thalamus. Basal ganglia are otherwise intact. The insular cortex is intact bilaterally. Acute cortical defect is evident.  Postsurgical changes are again noted at the anterior wall right maxillary sinus. The paranasal sinuses and mastoid air cells are otherwise clear. The calvarium is intact. Scleral banding is noted on the right. The globes and orbits are otherwise intact.  IMPRESSION: 1. Stable atrophy and white matter disease. 2. No acute intracranial abnormality.   Electronically Signed   By: San Morelle M.D.   On: 02/10/2015 13:11        ASSESSMENT / PLAN: Acute on Chronic respiratory failure in the setting of AECOPD LLL LB6 nodule   - Absent in 2010.  New in august 2015. Grown to 1.4cm April 2016 with spiculation  - This is high pre-test probl for stage 1A NSCLC  - Left alone - natural course of this disease is > 1year to several years Mild Hypernatremia Mild hyperkalemia   Discussion Not improving w/ current rx in-spite of aggressive Bronchodilator therapy, systemic steroids, and ABX. He has End-stage COPD, and was already utilizing oral morphine from time to time on home hospice. He says he does not care for the NIPPV, yet clinically he appears more comfortable on this.    Plan:   Ct  IV morphine PRN for break-thru pain and titrate scheduled Roxanol to relief of dyspnea (w/ hold for sedation orders) Change BD regimen to Albuterol q3, atrovent q6, and budesonide Q 12 D/c symbicort. Don't think he can effectively take this Can dc BIPAP  Palliative care input for symptom management  Absolutely appropriate to focus on comfort at ALL times w/ this patient. Please stop all labs & can transfer to floor once off bipap x 24h  Rigoberto Noel. MD

## 2015-02-12 NOTE — Progress Notes (Signed)
Pt shows no signs of dyspnea or increase WOB and states he is comfortable. Bipap not needed at this time. Pt remains on 3lpm nasal cannula SpO2 98%. RT will continue to monitor.

## 2015-02-12 NOTE — Consult Note (Signed)
Patient Anthony [name]      DOB: June 05, 1936      MLY:[id number]     Consult Note from the Palliative Medicine Team at [facility] Requested by: Dr Maryland Pink     PCP: Philis Fendt, MD Reason for Consultation:Unable to wean off BiPAP, Resp Failure    Phone Number:[phone]  Assessment/Recommendations: 79 yo male with PMHx of ES-COPD, h/o PE, sickle cell who was admitted with acute resp failure  1.  Code Status: DNR  2. GOC: While subjectively feeling better and off BiPAP this morning to [place], his resp status remains tenuous.  Has not been able to tolerate being off BiPAP for more than few hours.  Will need to see how he does. Certainly if not able to be off BiPAP this is of course a terrible prognostic sign in an already poor prognosis disease. At home, he was already on prednisone 40mg  daily and levaquin TID prophylacitcally.  I discussed these concerns with clarance and his son. I think the next 1-2 days will be important to see if he can indeed make any strides from being off BiPAP.  Hypoxia does not seem to be main issue, rather hypercapnia and work of breathing.  I spoke to these issues with [facility].  He is now revoked from hospice because of this admission.  I think even if he gets improvement, going back home will be an issue because he only has 4h of in home care per day and otherwise home alone.  I will follow-up tomorrow as well to build on conversations from today.     3. Symptom Management:   1. Acute on Chronic Resp Failure: On chronic steroids and IV steroids here.  I will increase his morphine to every 4h ATC as sometimes morphine does not last a full 6hr in some people.  Tenuous resp status and will need to see how he does. I will increase PRN morphine IV dose slightly as well.    4. Psychosocial/Spiritual: Lived at home alone. Enrolled in hospice with [facility] since sept 2015.  2 sons.     Brief HPI: 79 yo male with PMHx of ES-COPD,  PE, sickle cell disease who was admitted with 3 day history of progressively worsening cough and increased sputum production with associated dyspnea.  He has home health aide around 4h/day and also received care [facility] at [facility].  In speaking with [facility] today, he is on 40mg  of prednisone daily as well as prophylactic levaquin 3x/week. This was increased to daily and he was also placed on oral antihistamine as he complained of sinus congestion. As his breathing worsened, he requested admission to hospital (though hospice did discuss possibility of continuing care at home).  He was noted to have a new LLL nodule which is deemed to risky to work-up and also not likely to be cause of his demise even if malignancy.  Despite being hospitalized for the past week, he still requires intermittent BiPAP.  Today, at time of my visit, he is now on  and states that his breathing feels much better. No other acute complaints at this time.        PMH:  Past Medical History  Diagnosis Date  . COPD (chronic obstructive pulmonary disease)   . Sickle cell anemia   . Pulmonary embolism      PSH: Past Surgical History  Procedure Laterality Date  . No past surgeries    . Colonoscopy N/A 03/09/2013  Procedure: COLONOSCOPY;  Surgeon: Beryle Beams, MD;  Location: [facility];  Service: Endoscopy;  Laterality: N/A;  . Esophagogastroduodenoscopy N/A 03/09/2013    Procedure: ESOPHAGOGASTRODUODENOSCOPY (EGD);  Surgeon: Beryle Beams, MD;  Location: [facility] ENDOSCOPY;  Service: Endoscopy;  Laterality: N/A;   I have reviewed the [place] and [facility] and  If appropriate update it with new information. No Known Allergies Scheduled Meds: . albuterol  2.5 mg Nebulization Q3H  . budesonide (PULMICORT) nebulizer solution  0.5 mg Nebulization Q12H  . docusate sodium  100 mg Oral BID  . doxycycline  100 mg Oral Q12H  . enoxaparin (LOVENOX) injection  40 mg Subcutaneous Q24H  . feeding supplement (GLUCERNA SHAKE)  237 mL  Oral TID WC & HS  . ferrous sulfate  325 mg Oral BID WC  . folic acid  606 mcg Oral Daily  . guaiFENesin  600 mg Oral BID  . ipratropium  0.5 mg Nebulization Q6H  . loratadine  10 mg Oral Daily  . methylPREDNISolone (SOLU-MEDROL) injection  60 mg Intravenous Q12H  . morphine CONCENTRATE  5 mg Oral Q4H  . pantoprazole  40 mg Oral Q1200  . senna  2 tablet Oral QHS  . sodium chloride  3 mL Intravenous Q12H  . sodium polystyrene  30 g Oral Once   Continuous Infusions: . dextrose 5 % and 0.45% NaCl 50 mL/hr at 02/12/15 0854   PRN Meds:.acetaminophen **OR** acetaminophen, albuterol, bisacodyl, guaiFENesin-dextromethorphan, haloperidol lactate, HYDROcodone-acetaminophen, LORazepam, morphine injection, ondansetron **OR** ondansetron (ZOFRAN) IV, polyethylene glycol, sodium phosphate    BP 128/69 mmHg  Pulse 111  Temp(Src) 97.5 F (36.4 C) (Axillary)  Resp 16  Ht 5\' 10"  (1.778 m)  Wt 55 kg (121 lb 4.1 oz)  BMI 17.40 kg/m2  SpO2 100%   PPS: 20   Intake/Output Summary (Last 24 hours) at 02/12/15 1119 Last data filed at 02/12/15 1000  Gross per 24 hour  Intake   1545 ml  Output    295 ml  Net   1250 ml    Physical Exam:  General: Alert, NAD, pursed lip breathing HEENT:  F[place], sclera anicteric, mmm Neck; supple Chest:   Prolonged exp phase CVS: tachy Abdomen: soft, ND Ext: no edema  Labs: CBC    Component Value Date/Time   WBC 7.9 02/11/2015 0340   RBC 3.61* 02/11/2015 0340   RBC 3.56* 06/29/2014 0426   HGB 10.8* 02/11/2015 0340   HCT 32.7* 02/11/2015 0340   PLT 138* 02/11/2015 0340   MCV 90.6 02/11/2015 0340   MCH 29.9 02/11/2015 0340   MCHC 33.0 02/11/2015 0340   RDW 19.5* 02/11/2015 0340   LYMPHSABS 1.6 06/24/2014 1445   MONOABS 0.3 06/24/2014 1445   EOSABS 0.2 06/24/2014 1445   BASOSABS 0.1 06/24/2014 1445    BMET    Component Value Date/Time   NA 148* 02/12/2015 0330   K 5.2* 02/12/2015 0330   CL 103 02/12/2015 0330   CO2 38* 02/12/2015 0330    GLUCOSE 108* 02/12/2015 0330   BUN 30* 02/12/2015 0330   CREATININE 0.93 02/12/2015 0330   CALCIUM 10.2 02/12/2015 0330   GFRNONAA 80* 02/12/2015 0330   GFRAA >90 02/12/2015 0330    CMP     Component Value Date/Time   NA 148* 02/12/2015 0330   K 5.2* 02/12/2015 0330   CL 103 02/12/2015 0330   CO2 38* 02/12/2015 0330   GLUCOSE 108* 02/12/2015 0330   BUN 30* 02/12/2015 0330   CREATININE 0.93 02/12/2015 0330  CALCIUM 10.2 02/12/2015 0330   PROT 5.9* 02/08/2015 0350   ALBUMIN 3.5 02/08/2015 0350   AST 35 02/08/2015 0350   ALT 13 02/08/2015 0350   ALKPHOS 50 02/08/2015 0350   BILITOT 0.8 02/08/2015 0350   GFRNONAA 80* 02/12/2015 0330   GFRAA >90 02/12/2015 0330   4/8 CTA Chest IMPRESSION: No evidence of pulmonary embolism.  No acute cardiopulmonary disease. Minimal posterior bibasilar scarring/ atelectasis. Severe emphysematous disease.  1.4 cm spiculated nodule over the left lower lobe suspicious for primary malignancy. Recommend PET-CT for further evaluation.   4/11 CT Head  IMPRESSION: 1. Stable atrophy and white matter disease. 2. No acute intracranial abnormality.    Total time: 90 minutes Greater than 50%  of this time was spent counseling and coordinating care related to the above assessment and plan.  Doran Clay D.O. Palliative Medicine Team at [facility]  Pager: [phone] Team Phone: [phone]

## 2015-02-12 NOTE — Progress Notes (Signed)
RT took patient off Bi-Pap and placed on 3LPM nasal cannula. No distress noted at this time. RT will continue to monitor.

## 2015-02-12 NOTE — Progress Notes (Signed)
[facility] PROGRESS NOTE  Anthony [name] DJS:[id number] DOB: June 23, 1936 DOA: 02/07/2015  PCP: Philis Fendt, MD  Brief HPI: 79 year old African-American male brought in due to worsening shortness of breath. He was admitted with COPD exacerbation and placed on BiPAP. He is receiving hospice services at home due to his advanced COPD. Patient has required BiPAP on and off during this hospitalization. It has been difficult to wean him off of the BiPAP. Requesting pulmonology input in this matter.  Past medical history:  Past Medical History  Diagnosis Date  . COPD (chronic obstructive pulmonary disease)   . Sickle cell anemia   . Pulmonary embolism     Consultants: Pulmonology  Procedures: None  Antibiotics: Doxycycline  Subjective: Patient seen for the first time, still on BiPAP, reported shortness of breath. Discussed with the clinical nurse at bedside, reported whenever he is being taken off of BiPAP patient goes into desaturation episode. Because of BiPAP not able to eat drink.  Objective: Vital Signs  Filed Vitals:   02/12/15 0200 02/12/15 0300 02/12/15 0400 02/12/15 0445  BP: 147/60  117/66 117/66  Pulse: 76 77 42 114  Temp:   97.4 F (36.3 C)   TempSrc:   Axillary   Resp: 21 18 17 24   Height:      Weight:      SpO2: 100% 99% 100% 100%    Intake/Output Summary (Last 24 hours) at 02/12/15 0744 Last data filed at 02/12/15 0600  Gross per 24 hour  Intake   1390 ml  Output    645 ml  Net    745 ml   Filed Weights   02/07/15 2153 02/10/15 0400  Weight: 56 kg (123 lb 7.3 oz) 55 kg (121 lb 4.1 oz)    General appearance: On BiPAP. More awake and alert than yesterday morning.  Resp: decreased air entry bilaterally. No wheezing appreciated today. No rhonchi or crackles.  Cardio: regular rate and rhythm, S1, S2 normal, no murmur, click, rub or gallop GI: soft, non-tender; bowel sounds normal; no masses,  no organomegaly Extremities: extremities normal,  atraumatic, no cyanosis or edema Neurologic: Alert. No focal deficits.  Lab Results:  Basic Metabolic Panel:  Recent Labs Lab 02/08/15 0350 02/09/15 0400 02/10/15 0412 02/11/15 0340 02/12/15 0330  NA 142 142 145 148* 148*  K 4.8 5.0 4.5 5.3* 5.2*  CL 93* 94* 95* 100 103  CO2 41* 41* 43* 37* 38*  GLUCOSE 142* 127* 142* 116* 108*  BUN 14 24* 28* 30* 30*  CREATININE 1.34 1.12 0.95 0.91 0.93  CALCIUM 9.2 9.5 10.2 10.5 10.2  MG 2.3  --   --   --   --   PHOS 4.6  --   --   --   --    Liver Function Tests:  Recent Labs Lab 02/08/15 0350  AST 35  ALT 13  ALKPHOS 50  BILITOT 0.8  PROT 5.9*  ALBUMIN 3.5   CBC:  Recent Labs Lab 02/07/15 1600 02/08/15 0350 02/09/15 0400 02/11/15 0340  WBC 7.2 4.3 7.0 7.9  HGB 9.0* 9.0* 9.6* 10.8*  HCT 26.6* 26.1* 28.7* 32.7*  MCV 90.2 90.6 90.0 90.6  PLT 168 114* 144* 138*    Recent Results (from the past 240 hour(s))  MRSA PCR Screening     Status: None   Collection Time: 02/07/15 10:05 PM  Result Value Ref Range Status   MRSA by PCR NEGATIVE NEGATIVE Final    Comment:  The GeneXpert MRSA Assay (FDA approved for NASAL specimens only), is one component of a comprehensive MRSA colonization surveillance program. It is not intended to diagnose MRSA infection nor to guide or monitor treatment for MRSA infections.   Clostridium Difficile by PCR     Status: None   Collection Time: 02/11/15 12:53 AM  Result Value Ref Range Status   C difficile by pcr NEGATIVE NEGATIVE Final      Studies/Results: Ct Head Wo Contrast  02/10/2015   CLINICAL DATA:  Altered mental status.  Sickle cell anemia.  EXAM: CT HEAD WITHOUT CONTRAST  TECHNIQUE: Contiguous axial images were obtained from the base of the skull through the vertex without intravenous contrast.  COMPARISON:  CT head without contrast 06/04/2014  FINDINGS: Atrophy and diffuse white matter changes are again noted. There is a remote lacunar infarct within the right thalamus.  Basal ganglia are otherwise intact. The insular cortex is intact bilaterally. Acute cortical defect is evident.  Postsurgical changes are again noted at the anterior wall right maxillary sinus. The paranasal sinuses and mastoid air cells are otherwise clear. The calvarium is intact. Scleral banding is noted on the right. The globes and orbits are otherwise intact.  IMPRESSION: 1. Stable atrophy and white matter disease. 2. No acute intracranial abnormality.   Electronically Signed   By: San Morelle M.D.   On: 02/10/2015 13:11    Medications:  Scheduled: . albuterol  2.5 mg Nebulization Q3H  . budesonide (PULMICORT) nebulizer solution  0.5 mg Nebulization Q12H  . docusate sodium  100 mg Oral BID  . doxycycline  100 mg Oral Q12H  . enoxaparin (LOVENOX) injection  40 mg Subcutaneous Q24H  . feeding supplement (GLUCERNA SHAKE)  237 mL Oral TID WC & HS  . ferrous sulfate  325 mg Oral BID WC  . folic acid  528 mcg Oral Daily  . guaiFENesin  600 mg Oral BID  . ipratropium  0.5 mg Nebulization Q6H  . loratadine  10 mg Oral Daily  . methylPREDNISolone (SOLU-MEDROL) injection  60 mg Intravenous Q12H  . morphine CONCENTRATE  5 mg Oral QID  . pantoprazole  40 mg Oral Q1200  . senna  2 tablet Oral QHS  . sodium chloride  3 mL Intravenous Q12H   Continuous: . sodium chloride 50 mL/hr at 02/11/15 0700   UXL:[id number] **OR** acetaminophen, albuterol, bisacodyl, guaiFENesin-dextromethorphan, haloperidol lactate, HYDROcodone-acetaminophen, LORazepam, morphine injection, ondansetron **OR** ondansetron (ZOFRAN) IV, polyethylene glycol, sodium phosphate  Assessment/Plan:  Active Problems:   Sickle cell disease   PULMONARY EMBOLISM, HX OF   Anemia   COPD exacerbation   Hypokalemia   Pulmonary nodule   Incidental lung nodule, greater than or equal to 64mm   Acute respiratory failure with hypoxia and hypercarbia    Acute on chronic respiratory failure Patient is chronically on 2 L of  oxygen at home. Since admission to the hospital on of BiPAP, for the past 3-4 days patient continues to be on BiPAP. Unable to wean off of BiPAP, pulmonology consulted recommended palliative care. Await palliative care recommendation, patient is already on hospice at home.  Acute COPD exacerbation Patient had to be placed back on BiPAP again last night for increased work of breathing.  This is becoming an ongoing issue. Unable to completely wean him off of BiPAP.  Continue with steroids, nebulizer treatments, antibiotics. Continue with inhaled steroids as well. Influenza PCR was negative.  Patient under hospice services at home for advanced COPD.  Left lower lobe spiculated  Pulmonary nodule 1.4 cm speculated mass suspicious for malignancy, per pulmonology hold on further evaluation.  Abdominal distention X-ray does not show any obstruction. Abdomen is soft. Continue to monitor.  Low TSH Free T4 is normal.  Normocytic Anemia  This is chronic. Hemoglobin is at baseline.  History of Sickle cell disease Currently stable  Hyperkalemia/mildly elevated creatinine ? Overcorrection, patient was given Kayexalate yesterday without success. Repeat dose today.   DVT Prophylaxis: Lovenox    Code Status: DO NOT RESUSCITATE  Family Communication: Discussed with the patient. Discussed with his son. Disposition Plan: Will remain in step down. Await palliative input    LOS: 5 days   [facility] Pager [phone]  02/12/2015, 7:44 AM  If 7PM-7AM, please contact night-coverage at [email or web address], password [facility]

## 2015-02-13 DIAGNOSIS — Z515 Encounter for palliative care: Secondary | ICD-10-CM

## 2015-02-13 DIAGNOSIS — R0600 Dyspnea, unspecified: Secondary | ICD-10-CM

## 2015-02-13 DIAGNOSIS — R140 Abdominal distension (gaseous): Secondary | ICD-10-CM

## 2015-02-13 LAB — BASIC METABOLIC PANEL
ANION GAP: 4 — AB (ref 5–15)
BUN: 25 mg/dL — AB (ref 6–23)
CALCIUM: 10.4 mg/dL (ref 8.4–10.5)
CHLORIDE: 100 mmol/L (ref 96–112)
CO2: 40 mmol/L (ref 19–32)
CREATININE: 0.81 mg/dL (ref 0.50–1.35)
GFR calc non Af Amer: 84 mL/min — ABNORMAL LOW (ref >90)
GLUCOSE: 119 mg/dL — AB (ref 70–99)
Potassium: 5.3 mmol/L — ABNORMAL HIGH (ref 3.5–5.1)
Sodium: 144 mmol/L (ref 135–145)

## 2015-02-13 NOTE — Progress Notes (Addendum)
Patient Anthony [name]      DOB: February 12, 1936      WSF:[id number]   Palliative Medicine Team at [facility] Progress Note    Subjective: Breathing feels much better today. Off BiPAP now.  Wants to get out of bed.  No N/V. No other acute complaints.   Filed Vitals:   02/13/15 1143  BP:   Pulse:   Temp: 98 F (36.7 C)  Resp:    Physical exam: GEN: alert, NAD CV : RRR LUNGS: CTAB, prolonged exp phase, less pursed lip breathing ABD: soft, ND Ext: warm  CBC    Component Value Date/Time   WBC 7.9 02/11/2015 0340   RBC 3.61* 02/11/2015 0340   RBC 3.56* 06/29/2014 0426   HGB 10.8* 02/11/2015 0340   HCT 32.7* 02/11/2015 0340   PLT 138* 02/11/2015 0340   MCV 90.6 02/11/2015 0340   MCH 29.9 02/11/2015 0340   MCHC 33.0 02/11/2015 0340   RDW 19.5* 02/11/2015 0340   LYMPHSABS 1.6 06/24/2014 1445   MONOABS 0.3 06/24/2014 1445   EOSABS 0.2 06/24/2014 1445   BASOSABS 0.1 06/24/2014 1445    CMP     Component Value Date/Time   NA 144 02/13/2015 0345   K 5.3* 02/13/2015 0345   CL 100 02/13/2015 0345   CO2 40* 02/13/2015 0345   GLUCOSE 119* 02/13/2015 0345   BUN 25* 02/13/2015 0345   CREATININE 0.81 02/13/2015 0345   CALCIUM 10.4 02/13/2015 0345   PROT 5.9* 02/08/2015 0350   ALBUMIN 3.5 02/08/2015 0350   AST 35 02/08/2015 0350   ALT 13 02/08/2015 0350   ALKPHOS 50 02/08/2015 0350   BILITOT 0.8 02/08/2015 0350   GFRNONAA 84* 02/13/2015 0345   GFRAA >90 02/13/2015 0345      Assessment and plan: 79 yo male with PMHx of ES-COPD, h/o PE, sickle cell who was admitted with acute resp failure  1. Code Status: DNR  2. GOC: See initial consult. Now off BiPAP. Struggle going forward is that I am sure he has significant weakness and returning home (even with hospice care) will be difficult. He has 4hrs per day of in home care but rest of time he is home alone.  This may not be an option in near future or ever. I will consult PT to see how deconditioned he is.  He was in  rehab in June and reports that it was difficult be felt he did get stronger. We also talked that it may be difficult to rehab enough to ever return home given the restrictions his ES-COPD may place on his rehab ability.   3. Symptom Management:  1. Acute on Chronic Resp Failure/Dyspnea: doing better with ATC morphine q4h and some PRN.  If continues to do well, could back off moprhine to q6h scheduled and allow PRN dose.  Will see how he does  4. Psychosocial/Spiritual: Lived at home alone. [facility]rolled [facility] sept 2015 (now revoked 2/2 hospitalization). 2 sons.   Total Time: 25 minutes >50% of time spent in counseling and coordination of care regarding above.   Doran Clay D.O. Palliative Medicine Team at [facility]  Pager: [phone] Team Phone: [phone]

## 2015-02-13 NOTE — Progress Notes (Signed)
[facility] PROGRESS NOTE  OSBORN PULLIN ZYS:[id number] DOB: [birth year] DOA: 02/07/2015  PCP: Philis Fendt, MD  Brief HPI: 79 year old African-American male brought in due to worsening shortness of breath. He was admitted with COPD exacerbation and placed on BiPAP. He is receiving hospice services at home due to his advanced COPD. Patient has required BiPAP on and off during this hospitalization. It has been difficult to wean him off of the BiPAP. Requesting pulmonology input in this matter.  Past medical history:  Past Medical History  Diagnosis Date  . COPD (chronic obstructive pulmonary disease)   . Sickle cell anemia   . Pulmonary embolism     Consultants: Pulmonology  Procedures: None  Antibiotics: Doxycycline  Subjective: He is doing much better this morning off of BiPAP since yesterday evening. Sats 100% on 3 L, decreased that to 2.5 L of oxygen. Seen by palliative care. Seen also by pulmonology and recommended to transfer out of the ICU.  Objective: Vital Signs  Filed Vitals:   02/13/15 0822 02/13/15 0825 02/13/15 1143 02/13/15 1144  BP:      Pulse:      Temp:   98 F (36.7 C)   TempSrc:   Oral   Resp:      Height:      Weight:      SpO2: 100% 100%  99%    Intake/Output Summary (Last 24 hours) at 02/13/15 1252 Last data filed at 02/13/15 1144  Gross per 24 hour  Intake    850 ml  Output   1025 ml  Net   -175 ml   Filed Weights   02/07/15 2153 02/10/15 0400 02/13/15 0400  Weight: 56 kg (123 lb 7.3 oz) 55 kg (121 lb 4.1 oz) 53.3 kg (117 lb 8.1 oz)    General appearance: On BiPAP. More awake and alert than yesterday morning.  Resp: decreased air entry bilaterally.  Cardio: regular rate and rhythm, S1, S2 normal, no murmur, click, rub or gallop GI: soft, non-tender; bowel sounds normal; no masses,  no organomegaly Extremities: extremities normal, atraumatic, no cyanosis or edema Neurologic: Alert. No focal deficits.  Lab  Results:  Basic Metabolic Panel:  Recent Labs Lab 02/08/15 0350 02/09/15 0400 02/10/15 0412 02/11/15 0340 02/12/15 0330 02/13/15 0345  NA 142 142 145 148* 148* 144  K 4.8 5.0 4.5 5.3* 5.2* 5.3*  CL 93* 94* 95* 100 103 100  CO2 41* 41* 43* 37* 38* 40*  GLUCOSE 142* 127* 142* 116* 108* 119*  BUN 14 24* 28* 30* 30* 25*  CREATININE 1.34 1.12 0.95 0.91 0.93 0.81  CALCIUM 9.2 9.5 10.2 10.5 10.2 10.4  MG 2.3  --   --   --   --   --   PHOS 4.6  --   --   --   --   --    Liver Function Tests:  Recent Labs Lab 02/08/15 0350  AST 35  ALT 13  ALKPHOS 50  BILITOT 0.8  PROT 5.9*  ALBUMIN 3.5   CBC:  Recent Labs Lab 02/07/15 1600 02/08/15 0350 02/09/15 0400 02/11/15 0340  WBC 7.2 4.3 7.0 7.9  HGB 9.0* 9.0* 9.6* 10.8*  HCT 26.6* 26.1* 28.7* 32.7*  MCV 90.2 90.6 90.0 90.6  PLT 168 114* 144* 138*    Recent Results (from the past 240 hour(s))  MRSA PCR Screening     Status: None   Collection Time: 02/07/15 10:05 PM  Result Value Ref Range Status   MRSA  by PCR NEGATIVE NEGATIVE Final    Comment:        The GeneXpert MRSA Assay (FDA approved for NASAL specimens only), is one component of a comprehensive MRSA colonization surveillance program. It is not intended to diagnose MRSA infection nor to guide or monitor treatment for MRSA infections.   Clostridium Difficile by PCR     Status: None   Collection Time: 02/11/15 12:53 AM  Result Value Ref Range Status   C difficile by pcr NEGATIVE NEGATIVE Final      Studies/Results: No results found.  Medications:  Scheduled: . albuterol  2.5 mg Nebulization Q3H  . budesonide (PULMICORT) nebulizer solution  0.5 mg Nebulization Q12H  . docusate sodium  100 mg Oral BID  . doxycycline  100 mg Oral Q12H  . enoxaparin (LOVENOX) injection  40 mg Subcutaneous Q24H  . feeding supplement (GLUCERNA SHAKE)  237 mL Oral TID WC & HS  . ferrous sulfate  325 mg Oral BID WC  . folic acid  510 mcg Oral Daily  . guaiFENesin  600 mg  Oral BID  . ipratropium  0.5 mg Nebulization Q6H  . loratadine  10 mg Oral Daily  . methylPREDNISolone (SOLU-MEDROL) injection  60 mg Intravenous Q12H  . morphine CONCENTRATE  5 mg Oral Q4H  . pantoprazole  40 mg Oral Q1200  . senna  2 tablet Oral QHS  . sodium chloride  3 mL Intravenous Q12H   Continuous:   CHE:NIDPOEUMPNTIR **OR** acetaminophen, albuterol, bisacodyl, guaiFENesin-dextromethorphan, haloperidol lactate, HYDROcodone-acetaminophen, LORazepam, morphine injection, ondansetron **OR** ondansetron (ZOFRAN) IV, polyethylene glycol, sodium phosphate  Assessment/Plan:  Active Problems:   Sickle cell disease   PULMONARY EMBOLISM, HX OF   Anemia   COPD exacerbation   Hypokalemia   Pulmonary nodule   Incidental lung nodule, greater than or equal to 36mm   Acute respiratory failure with hypoxia and hypercarbia   Palliative care encounter    Acute on chronic respiratory failure Patient is chronically on 3 L of oxygen at home. Since admission to the hospital on of BiPAP, for the past 3-4 days patient continues to be on BiPAP. Unable to wean off of BiPAP, pulmonology consulted recommended palliative care. Seen by palliative care recommended PT, pulmonology recommended to transfer out of the ICU.  Acute COPD exacerbation Patient had to be placed back on BiPAP again last night for increased work of breathing.  This is becoming an ongoing issue. Unable to completely wean him off of BiPAP.  Continue with steroids, nebulizer treatments, antibiotics. Continue with inhaled steroids as well. Influenza PCR was negative.  Patient under hospice services at home for advanced COPD. No changes clinically  Left lower lobe spiculated Pulmonary nodule 1.4 cm speculated mass suspicious for malignancy, per pulmonology hold on further evaluation.  Abdominal distention X-ray does not show any obstruction. Abdomen is soft. Continue to monitor.  Low TSH Free T4 is normal.  Normocytic Anemia   This is chronic. Hemoglobin is at baseline.  History of Sickle cell disease Currently stable  Hyperkalemia/mildly elevated creatinine ? Overcorrection, patient was given Kayexalate yesterday without success. Repeat dose today.   DVT Prophylaxis: Lovenox    Code Status: DO NOT RESUSCITATE  Family Communication: Discussed with the patient. Discussed with his son. Disposition Plan: Will remain in step down. Await palliative input    LOS: 6 days   [facility] Pager [phone]  02/13/2015, 12:52 PM  If 7PM-7AM, please contact night-coverage at [email or web address], password [facility]

## 2015-02-13 NOTE — Progress Notes (Signed)
CSW continuing to follow.   CSW spoke with PMT MD, Dr. Deitra Mayo who reports that pt has been off of BiPap for 24 hours and slowly progressing. Per Dr. Deitra Mayo, plan is to consult physical therapy given pt deconditioning and to assess for pt disposition needs. Per Dr. Deitra Mayo, pt admitted from home with [facility] and had caregivers for 4 hours a day, but pt was alone during other times of the day.   CSW to await PT recommendations in order to assist as appropriate. If pt is rehab appropriate then pt can be placed under his [employer] benefit, but if pt is unable to tolerate rehab then pt has Medicaid and could be placed under his Medicaid and have hospice follow at [facility].   CSW to continue to follow to assess plan for appropriate discharge need and assist as appropriate.  Alison Murray, MSW, [place] Work [phone]

## 2015-02-13 NOTE — Progress Notes (Signed)
Patient off Bi-pap at this time and on a 3lpm nasal cannula. No distress noted at this time. RT will continue to monitor.

## 2015-02-13 NOTE — Progress Notes (Signed)
Pt states that he had his home medications with him on arrival to the hospital. Pt was brought to the hospital via EMS who were called by his home health/ hospice RN. There are no notes in the ED stating that he had his med's with him. I also called his son Anthony [name] to check and see did someone send his home med's with him and Anthony [name] denies taking med's of his fathers as well but Anthony [name] also stated that he did not if his dad, the pt had any med's with him when he arrived here either. I also spoke with someone in pharmacy who stated that there is no evidence of his home med's being sent to them for safe keeping while admitted. Will continue to follow up as needed and will pass this information on to the RN on 3rd taking over care of this pt.

## 2015-02-13 NOTE — Progress Notes (Signed)
PULMONARY / CRITICAL CARE MEDICINE   Name: Anthony [name] MRN: [id number] DOB: October 24, 1936    ADMISSION DATE:  02/07/2015 CONSULTATION DATE:  02/08/15   REFERRING MD :  DR Bonnielee Haff and Dr Roswell Nickel  CHIEF COMPLAINT:  Lung nodule; asked to see again on 4/12 for persistent BIPAP dependence    HISTORY OF PRESENT ILLNESS:   79 year old debilitated copd patient wtuih chronic resp failure, 3L o2, lives at  Home on home hospice. He is DNR/DNI. Admitted initially on 4/8 w/ working dx of AECOPD. We were initially consulted 4/9 for lung nodule. Treatment as of 4/12 had included: O2, NIPPV, scheduled BDs, and systemic steroids. In spite of these measures as of 4/12 he continued to require intermittent NIPPV. PCCM asked to re-eval to make further recommendations.     Review of his CT chest shows 2010 CT - no nodule in LLL LB6 area 2015 august - emergence of a nodule in [facility] area 2016 April - spiculated LB6 area 1.4cm nodule  SUBJ - Dyspnea  improved Stayed off  bipap Denies pain afebrile  VITAL SIGNS: Temp:  [97.4 F (36.3 C)-98.4 F (36.9 C)] 97.8 F (36.6 C) (04/14 0700) Pulse Rate:  [98-111] 101 (04/14 0800) Resp:  [12-22] 15 (04/14 0800) BP: (117-175)/(52-78) 175/68 mmHg (04/14 0800) SpO2:  [94 %-100 %] 100 % (04/14 0825) Weight:  [117 lb 8.1 oz (53.3 kg)] 117 lb 8.1 oz (53.3 kg) (04/14 0400) Currently 5 liters     VENTILATOR SETTINGS:   INTAKE / OUTPUT:  Intake/Output Summary (Last 24 hours) at 02/13/15 0857 Last data filed at 02/13/15 0700  Gross per 24 hour  Intake   1005 ml  Output    900 ml  Net    105 ml     PHYSICAL EXAMINATION: General:  Debilitated, frail, + accessory muscle use  Neuro:  Awake, oriented X 3. Appropriate. Moves all ext.   HEENT:  NEck supple Cardiovascular:  Normal heart sounds. No murmurs Lungs:  Barrell chest +. Marked prolonged exp rhonchi.  some trouble w/ cough clearance  Abdomen:  Soft. No mass. Normal bowel  sounds Musculoskeletal:  No cyanosis.No clubbing. No edema Skin:  Intact anteriorly  LABS:  Recent Labs Lab 02/07/15 1653 02/07/15 2008 02/09/15 2005  PHART 7.451* 7.380 7.349*  PCO2ART 61.8* 70.0* 78.0*  PO2ART 42.5* 57.7* 171.0*  HCO3 42.4* 40.4* 41.9*  TCO2 39.5 38.3 39.5  O2SAT 77.9 87.0 99.8    Recent Labs Lab 02/08/15 0350 02/09/15 0400 02/11/15 0340  HGB 9.0* 9.6* 10.8*  HCT 26.1* 28.7* 32.7*  WBC 4.3 7.0 7.9  PLT 114* 144* 138*    Recent Labs Lab 02/08/15 0350 02/09/15 0400 02/10/15 0412 02/11/15 0340 02/12/15 0330 02/13/15 0345  NA 142 142 145 148* 148* 144  K 4.8 5.0 4.5 5.3* 5.2* 5.3*  CL 93* 94* 95* 100 103 100  CO2 41* 41* 43* 37* 38* 40*  GLUCOSE 142* 127* 142* 116* 108* 119*  BUN 14 24* 28* 30* 30* 25*  CREATININE 1.34 1.12 0.95 0.91 0.93 0.81  CALCIUM 9.2 9.5 10.2 10.5 10.2 10.4  MG 2.3  --   --   --   --   --   PHOS 4.6  --   --   --   --   --    Estimated Creatinine Clearance: 58.5 mL/min (by C-G formula based on Cr of 0.81).  IMAGING x48h No results found.     ASSESSMENT / PLAN: Acute on  Chronic respiratory failure in the setting of AECOPD LLL LB6 nodule   - Absent in 2010.  New in august 2015. Grown to 1.4cm April 2016 with spiculation  - This is high pre-test probl for stage 1A NSCLC  - Left alone - natural course of this disease is > 1year to several years Mild Hypernatremia Mild hyperkalemia   Discussion  He has End-stage COPD, and was  on home hospice.Much improved dyspnea with titration of morphine   Plan:   Ct  IV morphine PRN for break-thru pain and titrate scheduled Roxanol to relief of dyspnea q 4h (w/ hold for sedation orders) Change BD regimen to Albuterol q3, atrovent q6, and budesonide Q 12 D/c symbicort. Don't think he can effectively take this Can dc BIPAP  Palliative care input for symptom management  Absolutely appropriate to focus on comfort at ALL times w/ this patient. Please stop all labs & can  transfer to floor now that  off bipap x 24h PCCM to sign off   Rigoberto Noel. MD

## 2015-02-14 LAB — BASIC METABOLIC PANEL
ANION GAP: 8 (ref 5–15)
BUN: 19 mg/dL (ref 6–23)
CHLORIDE: 96 mmol/L (ref 96–112)
CO2: 40 mmol/L — AB (ref 19–32)
CREATININE: 0.68 mg/dL (ref 0.50–1.35)
Calcium: 10.2 mg/dL (ref 8.4–10.5)
GFR calc Af Amer: >90 mL/min (ref >90)
GFR calc non Af Amer: >90 mL/min (ref >90)
Glucose, Bld: 94 mg/dL (ref 70–99)
Potassium: 3.9 mmol/L (ref 3.5–5.1)
Sodium: 144 mmol/L (ref 135–145)

## 2015-02-14 MED ORDER — MORPHINE SULFATE (CONCENTRATE) 10 MG/0.5ML PO SOLN
5.0000 mg | Freq: Four times a day (QID) | ORAL | Status: DC
  Administered 2015-02-14 – 2015-02-16 (×8): 5 mg via ORAL
  Filled 2015-02-14 (×8): qty 0.5

## 2015-02-14 MED ORDER — MORPHINE SULFATE (CONCENTRATE) 10 MG/0.5ML PO SOLN
5.0000 mg | ORAL | Status: DC | PRN
  Administered 2015-02-15: 5 mg via ORAL
  Filled 2015-02-14 (×2): qty 0.5

## 2015-02-14 NOTE — Progress Notes (Addendum)
Pt's oxygen saturation is 100% on 2.5L.   When taken off his oxygen, his level drops to 87%.

## 2015-02-14 NOTE — Progress Notes (Signed)
CSW continuing to follow.   CSW reviewed chart and noted that PT recommended SNF.   CSW met with pt at bedside. CSW introduced self and explained role.   CSW explored with pt disposition planning and discussed with pt recommendation for SNF for a short time in order to get stronger and return home. Pt expressed that he was hopeful to set up therapy in his home and be able to return home upon discharge. Pt states that he has an aide for 4h/day in the home. CSW discussed with pt that pt will need 24 hour care at this time given pt being deconditioned. Pt states that he is hopeful that his son, [name] and his wife will be able to provide the additional hours of care. Pt provided permission for CSW to contact pt son, [name] via telephone.   CSW contacted pt son, [name] via telephone while in pt room. CSW introduced self and explained role to pt son, [name]. CSW discussed with pt son that pt is nearing being medically ready for discharge and CSW discussed recommendation for SNF, but pt was interested in returning home with his 4 h/day caregivers and assistance from pt son and his wife for additional care when aide is not present. Pt son confirmed that he and his wife could assist with pt care when pt caregivers are not present. CSW inquired with pt son if he was interested in hospice returning to the home or home health services and pt son states that he feels that home health services will be best at this time in order for pt to get therapy in the home. Pt son states that he will be available for pt return home if pt discharge tomorrow morning. Pt grateful that pt son is going to provide support in the home, but pt wants to ensure that he has the oxygen that he needs in the home. CSW provided support and discussed with pt that RNCM will arrange needed home equipment.   CSW contacted RNCM, Cookie and left message notifying that pt and pt family wish for pt to return home with home health services.   No further  social work needs identified at this time.  CSW signing off.   Please re-consult if social work needs arise.   Alison Murray, MSW, [place] Work [phone]

## 2015-02-14 NOTE — Progress Notes (Signed)
Spoke with pt at bedside, his sons Anthony [name] and Anthony [name] via cell concerning discharge plan. Pt states that he do not want [facility]. Pt selected [facility] for HHPT and Home O2. Referral given to [facility] will need MD orders for HHPT and DME O2. Thanks

## 2015-02-14 NOTE — Evaluation (Signed)
Physical Therapy Evaluation Patient Details Name: DAHLTON HINDE MRN: [id number] DOB: [birth year] Today's Date: 02/14/2015   History of Present Illness  79 yo male admitted with COPD exac. Hx of COPD-O2 dep, sickle cell disease, PE. Pt lives alone with PCA 5x/week for 5 hours.   Clinical Impression  On eval, pt required Min assist for mobility-able to stand x 2 for ~20-30 seconds each attempt and perform stand pivot from bed to recliner with use of RW. Fatigues quickly and easily with minimal activity. Required multiple rest breaks throughout session. Remained on 2.5L O2 at all times. O2 94% 2.5L, HR 122 bpm with activity (99% 2.5L, HR 114 bpm at rest). At this time, recommend SNF for rehab.    Follow Up Recommendations SNF;Supervision/Assistance - 24 hour    Equipment Recommendations  None recommended by PT    Recommendations for Other Services OT consult     Precautions / Restrictions Precautions Precautions: Fall Precaution Comments: monitor vitals. O2 dep Restrictions Weight Bearing Restrictions: No      Mobility  Bed Mobility Overal bed mobility: Needs Assistance Bed Mobility: Supine to Sit     Supine to sit: HOB elevated;Min guard     General bed mobility comments: close guard for safety. Increased time. Seated rest break for at least 5 minutes once pt was at EOB  Transfers Overall transfer level: Needs assistance Equipment used: Rolling walker (2 wheeled) Transfers: Sit to/from [facility] Sit to Stand: Min assist;From elevated surface Stand pivot transfers: Min assist       General transfer comment: Assist to rise, stabilize, control descent, maneuver with RW. VCs  safety, technique, hand placement. Stand pivot from bed to recliner with RW  Ambulation/Gait             General Gait Details: NT-not able to attempt this session  Stairs            Wheelchair Mobility    Modified Rankin (Stroke Patients Only)       Balance                                              Pertinent Vitals/Pain Pain Assessment: No/denies pain    Home Living Family/patient expects to be discharged to:: Private residence Living Arrangements: Alone Available Help at Discharge: Personal care attendant (5 hours/day M-F) Type of Home: Apartment Home Access: Level entry     Home Layout: One level Home Equipment: Bedside commode;Wheelchair - [employer] - 4 wheels Additional Comments: oxygen-was wearing 3 L at home.     Prior Function Level of Independence: Needs assistance   Gait / Transfers Assistance Needed: sometimes needed assistance for ambulation. using rollator and O2  ADL's / Homemaking Assistance Needed: aide assists with ADLS, meal preparation, house cleaning        Hand Dominance        Extremity/Trunk Assessment   Upper Extremity Assessment: Generalized weakness           Lower Extremity Assessment: Generalized weakness      Cervical / Trunk Assessment: Kyphotic  Communication   Communication: No difficulties  Cognition Arousal/Alertness: Awake/alert Behavior During Therapy: WFL for tasks assessed/performed Overall Cognitive Status: Within Functional Limits for tasks assessed                      General Comments  Exercises        Assessment/Plan    PT Assessment Patient needs continued PT services  PT Diagnosis Difficulty walking;Generalized weakness   PT Problem List Decreased strength;Decreased activity tolerance;Decreased balance;Decreased mobility;Decreased knowledge of use of DME;Pain;Cardiopulmonary status limiting activity  PT Treatment Interventions DME instruction;Gait training;Functional mobility training;Therapeutic activities;Therapeutic exercise;Patient/family education;Balance training   PT Goals (Current goals can be found in the Care Plan section) Acute Rehab PT Goals Patient Stated Goal: to get stronger PT Goal Formulation: With  patient Time For Goal Achievement: 02/21/15 Potential to Achieve Goals: Fair    Frequency Min 3X/week   Barriers to discharge        Co-evaluation               End of Session Equipment Utilized During Treatment: Oxygen Activity Tolerance: Patient limited by fatigue Patient left: in chair;with chair alarm set;with call bell/phone within reach           Time: 0914-0949 PT Time Calculation (min) (ACUTE ONLY): 35 min   Charges:   PT Evaluation $Initial PT Evaluation Tier I: 1 Procedure PT Treatments $Therapeutic Activity: 8-22 mins   PT G Codes:        Weston Anna, MPT Pager: [phone]

## 2015-02-14 NOTE — Progress Notes (Signed)
[facility] PROGRESS NOTE  Anthony [name] XBD:[id number] DOB: 11-23-1935 DOA: 02/07/2015  PCP: Philis Fendt, MD  Brief HPI: 79 year old African-American male brought in due to worsening shortness of breath. He was admitted with COPD exacerbation and placed on BiPAP. He is receiving hospice services at home due to his advanced COPD. Patient has required BiPAP on and off during this hospitalization. It has been difficult to wean him off of the BiPAP. Requesting pulmonology input in this matter.  Past medical history:  Past Medical History  Diagnosis Date  . COPD (chronic obstructive pulmonary disease)   . Sickle cell anemia   . Pulmonary embolism     Consultants: Pulmonology  Procedures: None  Antibiotics: Doxycycline  Subjective: Transferred out of the ICU yesterday, off of BiPAP since 2 days ago. Seen by PT and recommended SNF, patient wants to go back home. Currently denies any shortness of breath when I interviewed him he was worried about calling his son.  Objective: Vital Signs  Filed Vitals:   02/14/15 0600 02/14/15 0820 02/14/15 0828 02/14/15 1059  BP: 145/76     Pulse: 111     Temp: 98.5 F (36.9 C)     TempSrc: Oral     Resp: 20     Height:      Weight:      SpO2: 99% 98% 98% 98%    Intake/Output Summary (Last 24 hours) at 02/14/15 1221 Last data filed at 02/14/15 0602  Gross per 24 hour  Intake    200 ml  Output    950 ml  Net   -750 ml   Filed Weights   02/07/15 2153 02/10/15 0400 02/13/15 0400  Weight: 56 kg (123 lb 7.3 oz) 55 kg (121 lb 4.1 oz) 53.3 kg (117 lb 8.1 oz)    General appearance: On BiPAP. More awake and alert than yesterday morning.  Resp: decreased air entry bilaterally, faint wheezing.  Cardio: regular rate and rhythm, S1, S2 normal, no murmur, click, rub or gallop GI: soft, non-tender; bowel sounds normal; no masses,  no organomegaly Extremities: extremities normal, atraumatic, no cyanosis or edema Neurologic:  Alert. No focal deficits.  Lab Results:  Basic Metabolic Panel:  Recent Labs Lab 02/08/15 0350  02/10/15 0412 02/11/15 0340 02/12/15 0330 02/13/15 0345 02/14/15 0402  NA 142  < > 145 148* 148* 144 144  K 4.8  < > 4.5 5.3* 5.2* 5.3* 3.9  CL 93*  < > 95* 100 103 100 96  CO2 41*  < > 43* 37* 38* 40* 40*  GLUCOSE 142*  < > 142* 116* 108* 119* 94  BUN 14  < > 28* 30* 30* 25* 19  CREATININE 1.34  < > 0.95 0.91 0.93 0.81 0.68  CALCIUM 9.2  < > 10.2 10.5 10.2 10.4 10.2  MG 2.3  --   --   --   --   --   --   PHOS 4.6  --   --   --   --   --   --   < > = values in this interval not displayed. Liver Function Tests:  Recent Labs Lab 02/08/15 0350  AST 35  ALT 13  ALKPHOS 50  BILITOT 0.8  PROT 5.9*  ALBUMIN 3.5   CBC:  Recent Labs Lab 02/07/15 1600 02/08/15 0350 02/09/15 0400 02/11/15 0340  WBC 7.2 4.3 7.0 7.9  HGB 9.0* 9.0* 9.6* 10.8*  HCT 26.6* 26.1* 28.7* 32.7*  MCV 90.2 90.6 90.0  90.6  PLT 168 114* 144* 138*    Recent Results (from the past 240 hour(s))  MRSA PCR Screening     Status: None   Collection Time: 02/07/15 10:05 PM  Result Value Ref Range Status   MRSA by PCR NEGATIVE NEGATIVE Final    Comment:        The GeneXpert MRSA Assay (FDA approved for NASAL specimens only), is one component of a comprehensive MRSA colonization surveillance program. It is not intended to diagnose MRSA infection nor to guide or monitor treatment for MRSA infections.   Clostridium Difficile by PCR     Status: None   Collection Time: 02/11/15 12:53 AM  Result Value Ref Range Status   C difficile by pcr NEGATIVE NEGATIVE Final      Studies/Results: No results found.  Medications:  Scheduled: . albuterol  2.5 mg Nebulization Q3H  . budesonide (PULMICORT) nebulizer solution  0.5 mg Nebulization Q12H  . docusate sodium  100 mg Oral BID  . doxycycline  100 mg Oral Q12H  . enoxaparin (LOVENOX) injection  40 mg Subcutaneous Q24H  . feeding supplement (GLUCERNA  SHAKE)  237 mL Oral TID WC & HS  . ferrous sulfate  325 mg Oral BID WC  . folic acid  696 mcg Oral Daily  . guaiFENesin  600 mg Oral BID  . ipratropium  0.5 mg Nebulization Q6H  . loratadine  10 mg Oral Daily  . methylPREDNISolone (SOLU-MEDROL) injection  60 mg Intravenous Q12H  . morphine CONCENTRATE  5 mg Oral Q6H  . pantoprazole  40 mg Oral Q1200  . senna  2 tablet Oral QHS  . sodium chloride  3 mL Intravenous Q12H   Continuous:   EXB:[id number] **OR** acetaminophen, albuterol, bisacodyl, guaiFENesin-dextromethorphan, haloperidol lactate, LORazepam, morphine CONCENTRATE, ondansetron **OR** ondansetron (ZOFRAN) IV, polyethylene glycol, sodium phosphate  Assessment/Plan:  Active Problems:   Sickle cell disease   PULMONARY EMBOLISM, HX OF   Anemia   COPD exacerbation   Hypokalemia   Pulmonary nodule   Incidental lung nodule, greater than or equal to 26mm   Acute respiratory failure with hypoxia and hypercarbia   Palliative care encounter   Abdominal distension    Acute on chronic respiratory failure Patient is chronically on 3 L of oxygen at home. Since admission to the hospital on of BiPAP, for the past 3-4 days patient continues to be on BiPAP. Unable to wean off of BiPAP, pulmonology consulted recommended palliative care. Seen by physical therapy, recommended skilled nursing facility placement. Spoke with the clinical social worker, patient wants to go back home, his wife and an aide helps him.  Acute COPD exacerbation Patient had to be placed back on BiPAP again last night for increased work of breathing.  This is becoming an ongoing issue. Unable to completely wean him off of BiPAP.  Continue with steroids, nebulizer treatments, antibiotics. Continue with inhaled steroids as well. Influenza PCR was negative.  Patient under hospice services at home for advanced COPD. No changes clinically  Left lower lobe spiculated Pulmonary nodule 1.4 cm speculated mass  suspicious for malignancy, per pulmonology hold on further evaluation.  Abdominal distention X-ray does not show any obstruction. Abdomen is soft. Continue to monitor.  Low TSH Free T4 is normal.  Normocytic Anemia  This is chronic. Hemoglobin is at baseline.  History of Sickle cell disease Currently stable  Hyperkalemia/mildly elevated creatinine ? Overcorrection, patient was given Kayexalate yesterday without success. Repeat dose today.   DVT Prophylaxis: Lovenox  Code Status: DO NOT RESUSCITATE  Family Communication: Discussed with the patient. Discussed with his son. Disposition Plan: Will remain in step down. Await palliative input    LOS: 7 days   [facility]  [facility] Pager [phone]  02/14/2015, 12:21 PM  If 7PM-7AM, please contact night-coverage at [email or web address], password [facility]

## 2015-02-14 NOTE — Clinical Documentation Improvement (Signed)
Registered Dietician noted 02/10/15 "severe malnutrition related to chronic illness, end-stage COPD as evidenced by severe muscle and moderate fat wasting".  Please document in your progress notes and carry over to the discharge summary if you agree with the Registered Dietician's assessment of severe malnutrition.  Possible Clinical Conditions: -Severe malnutrition related to chronic illness, end-stage COPD -Malnutrition  - other severity (please specify) -Other condition (please specify) -Unable to determine at present  Thank you, Mateo Flow, RN [phone] Clinical Documentation Specialist

## 2015-02-14 NOTE — Progress Notes (Signed)
Patient BT:DHRCBUL E Reinecke      DOB: 13-Sep-1936      AGT:[id number]   Palliative Medicine Team at [facility] Progress Note    Subjective: Breathing feels better. Eating some lunch. Got from bed to chair without major setback in breathing. Remains off BiPAP.      Filed Vitals:   02/14/15 0600  BP: 145/76  Pulse: 111  Temp: 98.5 F (36.9 C)  Resp: 20   Physical exam: GEN: alert, NAD, sitting up in chair CV : mild tachycardia LUNGS: CTAB, no pursed lip breathing today ABD: soft, ND Ext: warm  CBC    Component Value Date/Time   WBC 7.9 02/11/2015 0340   RBC 3.61* 02/11/2015 0340   RBC 3.56* 06/29/2014 0426   HGB 10.8* 02/11/2015 0340   HCT 32.7* 02/11/2015 0340   PLT 138* 02/11/2015 0340   MCV 90.6 02/11/2015 0340   MCH 29.9 02/11/2015 0340   MCHC 33.0 02/11/2015 0340   RDW 19.5* 02/11/2015 0340   LYMPHSABS 1.6 06/24/2014 1445   MONOABS 0.3 06/24/2014 1445   EOSABS 0.2 06/24/2014 1445   BASOSABS 0.1 06/24/2014 1445    CMP     Component Value Date/Time   NA 144 02/14/2015 0402   K 3.9 02/14/2015 0402   CL 96 02/14/2015 0402   CO2 40* 02/14/2015 0402   GLUCOSE 94 02/14/2015 0402   BUN 19 02/14/2015 0402   CREATININE 0.68 02/14/2015 0402   CALCIUM 10.2 02/14/2015 0402   PROT 5.9* 02/08/2015 0350   ALBUMIN 3.5 02/08/2015 0350   AST 35 02/08/2015 0350   ALT 13 02/08/2015 0350   ALKPHOS 50 02/08/2015 0350   BILITOT 0.8 02/08/2015 0350   GFRNONAA >90 02/14/2015 0402   GFRAA >90 02/14/2015 0402      Assessment and plan: 79 yo male with PMHx of ES-COPD, h/o PE, sickle cell who was admitted with acute resp failure  1. Code Status: DNR  2. GOC: See prior notes and SW note from today. He wants to go home and do home health/PT.  He reports that his family will be able to provide 24h care for a time.  Hospice will not be able to provide services in addition to home health rehab. It sounds like [facility] has been doing a good job in managing his COPD  at home for a while. I will call and let them know plan. Hopefully they can and/or are willing to re-engage after home health. Mr Loveday would be happy for this to occur as well.   3. Symptom Management:  1. Acute on Chronic Resp Failure/Dyspnea: I will back of scheduled morphine to q6h and allow PRN oral dose. D/C IV morphine and PO hydrocodone to prevent duplicate therapies.  4. Psychosocial/Spiritual: Lived at home alone. Enrolled in hospice with [facility] since sept 2015 (now revoked 2/2 hospitalization). 2 sons.    Doran Clay D.O. Palliative Medicine Team at [facility]  Pager: [phone] Team Phone: [phone]

## 2015-02-15 DIAGNOSIS — D571 Sickle-cell disease without crisis: Secondary | ICD-10-CM

## 2015-02-15 NOTE — Care Management Note (Signed)
    Page 1 of 2   02/15/2015     3:53:34 PM CARE MANAGEMENT NOTE 02/15/2015  Patient:  Anthony [name], Anthony [name]   Account Number:  [id number]  Date Initiated:  02/09/2015  Documentation initiated by:  [facility]  Subjective/Objective Assessment:   COPD exac     Action/Plan:   [facility] active   Anticipated DC Date:  02/15/2015   Anticipated DC Plan:  [place]  In-house referral  Clinical Social Worker      DC Planning Services  CM consult      Choice offered to / List presented to:  C-1 Patient   DME arranged  [facility]      DME agency  [place] arranged  HH-1 RN  [place]      [place].   Status of service:  Completed, signed off Medicare Important Message given?  YES (If response is "NO", the following Medicare IM given date fields will be blank) Date Medicare IM given:  02/15/2015 Medicare IM given by:  [facility] Date Additional Medicare IM given:   Additional Medicare IM given by:    Discharge Disposition:  [place]  Per UR Regulation:  Reviewed for med. necessity/level of care/duration of stay  If discussed at [place] of Stay Meetings, dates discussed:    Comments:  02/15/15 Dessa Phi RN BSN NCM [phone] [place] aware of d/c & hhc orders.qualified for home 02.AHc dme rep Pura Spice following for home 02.nsg aware also for delivery to patient's rm prior d/c home.  02/14/15 MMcGibboney, RN, BSN Spoke with pt at bedside, his sons [name] and Ronalee Belts via cell concerning discharge plan. Pt states that he do not want [facility]. Pt selected [facility] for HHPT and Home O2. Referral given to [facility] will need MD orders for HHPT and DME O2. Thanks  02/09/2015 1700 Chart reviewed. Jonnie Finner RN CCM Case Mgmt phone [phone]

## 2015-02-15 NOTE — Discharge Summary (Signed)
Physician Discharge Summary  Anthony [name] IWL:[id number] DOB: 12-Jan-1936 DOA: 02/07/2015  PCP: Anthony Fendt, MD  Admit date: 02/07/2015 Discharge date: 02/15/2015  Time spent: 40 minutes  Recommendations for Outpatient Follow-up:  1. Follow-up with primary care physician within one week. 2. [facility] for home PT/OT/RN/oxygen.   Discharge Diagnoses:  Active Problems:   Sickle cell disease   PULMONARY EMBOLISM, HX OF   Anemia   COPD exacerbation   Hypokalemia   Pulmonary nodule   Incidental lung nodule, greater than or equal to 47mm   Acute respiratory failure with hypoxia and hypercarbia   Palliative care encounter   Abdominal distension   Discharge Condition: Stable  Diet recommendation: Heart healthy  Filed Weights   02/07/15 2153 02/10/15 0400 02/13/15 0400  Weight: 56 kg (123 lb 7.3 oz) 55 kg (121 lb 4.1 oz) 53.3 kg (117 lb 8.1 oz)    History of present illness:  Anthony [name] is a 79 y.o. male   has a past medical history of COPD (chronic obstructive pulmonary disease); Sickle cell anemia; and Pulmonary embolism.   Presented with  Patient has chronic history of COPD at baseline on 3 L of oxygen. Patient lives at home alone but has nurse come out regularly. Reports fever and cough for few days. Reportedly was sent in by hospice nurse for shortness of breath for past 3 days as well as productive cough of yellow sputum. Nebulizer treatments were given at home without any improvement. Patient was started on BiPAP on arrival by EMS. ABG was obtained showing pH 7.451/61.8/42.5. Repeat ABG after being on BiPAP showed pH 7.380/70/57.7 Patient in emergency department had CT angiogram of chest due to prior history of PE that showed 1.4 cm spiculated nodule over left lower lobe no evidence of pulmonary embolism. While in emergency department patient was given Solu-Medrol 125 mg IV as well as albuterol nebulizer and Atrovent nebulizer, Ativan 1 mg IV and bolus  500 mL  Hospitalist was called for admission for COPD exacerbation   Hospital Course:    Acute on chronic respiratory failure Patient is chronically on 3 L of oxygen at home. Since admission to the hospital on of BiPAP, for 3-4 days after admission patient continues to be on BiPAP. Unable to wean off of BiPAP, pulmonology consulted recommended palliative care. Patient was able to wean off of BiPAP to his home dose of oxygen of 3 L. Finished antibiotics, steroids as well no much of her wheezing. Seen by physical therapy, recommended skilled nursing facility placement. Spoke with the clinical social worker, patient wants to go back home, his ex-wife and son will help him.  Acute COPD exacerbation Patient was BiPAP on and off for initial 4 days before this admission. Treated with with steroids, nebulizer treatments, antibiotics. Continue with inhaled steroids as well.  Influenza PCR was negative.  Patient under hospice services at home for advanced COPD.  Left lower lobe spiculated Pulmonary nodule 1.4 cm speculated mass suspicious for malignancy, per pulmonology hold on further evaluation.  Abdominal distention X-ray does not show any obstruction. Abdomen is soft. Continue to monitor.  Low TSH Free T4 is normal.  Normocytic Anemia  This is chronic. Hemoglobin is at baseline.  History of Sickle cell disease Currently stable  Hyperkalemia/mildly elevated creatinine ? Overcorrection, patient was given Kayexalate yesterday without success. This improved with Kayexalate  Hospice According to notes patient was in hospice at home, but on discharge he does not want his current agency. [facility] was chosen.   Procedures:  BiPAP  Consultations:  PCCM  Palliative  Discharge Exam: Filed Vitals:   02/15/15 0447  BP: 144/60  Pulse: 108  Temp: 98.6 F (37 C)  Resp: 16   General: Alert and awake, oriented x3, not in any acute distress. HEENT: anicteric  sclera, pupils reactive to light and accommodation, EOMI CVS: S1-S2 clear, no murmur rubs or gallops Chest: clear to auscultation bilaterally, no wheezing, rales or rhonchi Abdomen: soft nontender, nondistended, normal bowel sounds, no organomegaly Extremities: no cyanosis, clubbing or edema noted bilaterally Neuro: Cranial nerves II-XII intact, no focal neurological deficits  Discharge Instructions   Discharge Instructions    Diet - low sodium heart healthy    Complete by:  As directed      Increase activity slowly    Complete by:  As directed           Current Discharge Medication List    CONTINUE these medications which have NOT CHANGED   Details  albuterol (PROVENTIL HFA;VENTOLIN HFA) 108 (90 BASE) MCG/ACT inhaler Inhale 2 puffs into the lungs every 6 (six) hours as needed for wheezing or shortness of breath (wheezing and sob).    albuterol (PROVENTIL) (2.5 MG/3ML) 0.083% nebulizer solution Take 3 mLs (2.5 mg total) by nebulization every 4 (four) hours as needed for wheezing or shortness of breath. Qty: 150 mL, Refills: 0    cetirizine (ZYRTEC) 10 MG tablet Take 10 mg by mouth daily.    Cholecalciferol (VITAMIN D) 2000 UNITS tablet Take 2,000 Units by mouth daily.    docusate sodium 100 MG CAPS Take 100 mg by mouth 2 (two) times daily. Qty: 10 capsule, Refills: 0    ENSURE (ENSURE) Take 237 mLs by mouth 4 (four) times daily -  with meals and at bedtime. Refills: 12    ferrous sulfate 325 (65 FE) MG tablet Take 325 mg by mouth 2 (two) times daily with a meal.    folic acid (FOLVITE) 324 MCG tablet Take 400 mcg by mouth daily.    furosemide (LASIX) 20 MG tablet Take 20 mg by mouth daily.    Ipratropium-Albuterol (COMBIVENT RESPIMAT IN) Inhale 2 puffs into the lungs 3 (three) times daily.    ipratropium-albuterol (DUONEB) 0.5-2.5 (3) MG/3ML SOLN Take 3 mLs by nebulization 4 (four) times daily -  before meals and at bedtime. Qty: 360 mL, Refills: 0    levofloxacin  (LEVAQUIN) 750 MG tablet Take 1 tablet (750 mg total) by mouth every other day. Qty: 1 tablet, Refills: 0    LORazepam (ATIVAN) 0.5 MG tablet Take 0.5 mg by mouth every 6 (six) hours as needed for anxiety.    magnesium citrate SOLN Take 1 Bottle by mouth once.    morphine (MSIR) 15 MG tablet Take 1 tablet (15 mg total) by mouth every 4 (four) hours as needed for moderate pain or severe pain (or shortness of breath). Qty: 30 tablet, Refills: 0    pantoprazole (PROTONIX) 40 MG tablet Take 1 tablet (40 mg total) by mouth daily at 12 noon.    polyethylene glycol (MIRALAX / GLYCOLAX) packet Take 17 g by mouth 2 (two) times daily. Qty: 100 each, Refills: 0    potassium chloride (MICRO-K) 10 MEQ CR capsule Take 10 mEq by mouth daily.     predniSONE (DELTASONE) 20 MG tablet Take 2 tablets (40 mg total) by mouth daily with breakfast. Qty: 6 tablet, Refills: 0    senna (SENOKOT) 8.6 MG TABS tablet Take  2 tablets (17.2 mg total) by mouth at bedtime. Qty: 120 each, Refills: 0    sodium phosphate (FLEET) enema Place 1 enema rectally daily as needed (constipation). follow package directions    bisacodyl (DULCOLAX) 10 MG suppository Place 1 suppository (10 mg total) rectally daily as needed for moderate constipation. Qty: 12 suppository, Refills: 0    budesonide-formoterol (SYMBICORT) 160-4.5 MCG/ACT inhaler Inhale 2 puffs into the lungs 2 (two) times daily. Qty: 1 Inhaler, Refills: 0    fluticasone (FLONASE) 50 MCG/ACT nasal spray Place 2 sprays into the nose daily. Qty: 16 g, Refills: 0    megestrol (MEGACE) 400 MG/10ML suspension Take 10 mLs (400 mg total) by mouth daily. Qty: 240 mL, Refills: 0    sodium chloride (OCEAN) 0.65 % SOLN nasal spray Place 1 spray into both nostrils as needed for congestion. Refills: 0    Vitamin D, Ergocalciferol, (DRISDOL) 50000 UNITS CAPS Take 50,000 Units by mouth every 7 (seven) days. On mondays       No Known Allergies    The results of  significant diagnostics from this hospitalization (including imaging, microbiology, ancillary and laboratory) are listed below for reference.    Significant Diagnostic Studies: Dg Abd 1 View  02/08/2015   CLINICAL DATA:  Abdominal distention today.  EXAM: ABDOMEN - 1 VIEW  COMPARISON:  CT abdomen and pelvis 06/24/2014.  Abdomen 06/24/2014.  FINDINGS: Scattered gas and stool throughout the colon. No small or large bowel distention. Residual contrast material in the renal collecting systems and bladder. Fibrosis in the lung bases. Degenerative changes in the spine and hips. Bone sclerosis in the proximal femurs probably representing bone infarcts due to sickle cell disease.  IMPRESSION: Nonobstructive bowel gas pattern.   Electronically Signed   By: Lucienne Capers M.D.   On: 02/08/2015 01:23   Ct Head Wo Contrast  02/10/2015   CLINICAL DATA:  Altered mental status.  Sickle cell anemia.  EXAM: CT HEAD WITHOUT CONTRAST  TECHNIQUE: Contiguous axial images were obtained from the base of the skull through the vertex without intravenous contrast.  COMPARISON:  CT head without contrast 06/04/2014  FINDINGS: Atrophy and diffuse white matter changes are again noted. There is a remote lacunar infarct within the right thalamus. Basal ganglia are otherwise intact. The insular cortex is intact bilaterally. Acute cortical defect is evident.  Postsurgical changes are again noted at the anterior wall right maxillary sinus. The paranasal sinuses and mastoid air cells are otherwise clear. The calvarium is intact. Scleral banding is noted on the right. The globes and orbits are otherwise intact.  IMPRESSION: 1. Stable atrophy and white matter disease. 2. No acute intracranial abnormality.   Electronically Signed   By: San Morelle M.D.   On: 02/10/2015 13:11   Ct Angio Chest Pe W/cm &/or Wo Cm  02/07/2015   CLINICAL DATA:  pt with Hx of COPD and Sickle Cell from home, sent by hospice nurse for SOB x 3 days, productive  cough with yellow sputum, hospice nurse administered 3 separate nebulizer treatments without success.  EXAM: CT ANGIOGRAPHY CHEST WITH CONTRAST  TECHNIQUE: Multidetector CT imaging of the chest was performed using the standard protocol during bolus administration of intravenous contrast. Multiplanar CT image reconstructions and MIPs were obtained to evaluate the vascular anatomy.  CONTRAST:  69mL OMNIPAQUE IOHEXOL 350 MG/ML SOLN  COMPARISON:  06/25/2014 and 08/08/2009 as well as CT abdomen 03/01/2013  FINDINGS: Lungs are well inflated and demonstrate moderate diffuse bilateral centriacinar emphysematous disease. Minimal scarring/atelectasis  over the posterior lung bases. No focal airspace consolidation or effusion. There is a enlarging spiculated nodule over the left lower lobe measuring 1.4 cm. Airways are within normal.  Heart is normal size. Pulmonary arterial system is within normal without evidence of emboli. No evidence of mediastinal hilar adenopathy. Remaining mediastinal structures are unremarkable.  Images through the upper abdomen are unchanged. Degenerative change with sclerosis and endplate changes over the thoracic spine which are stable compatible with known sickle cell disease.  Review of the MIP images confirms the above findings.  IMPRESSION: No evidence of pulmonary embolism.  No acute cardiopulmonary disease. Minimal posterior bibasilar scarring/ atelectasis. Severe emphysematous disease.  1.4 cm spiculated nodule over the left lower lobe suspicious for primary malignancy. Recommend PET-CT for further evaluation.   Electronically Signed   By: Marin Olp M.D.   On: 02/07/2015 19:20   Dg Chest Port 1 View  02/07/2015   CLINICAL DATA:  Increase short of breath, COPD. Sickle cell disease.  EXAM: PORTABLE CHEST - 1 VIEW  COMPARISON:  Radiograph 06/24/2014  FINDINGS: Normal cardiac silhouette. There is chronic scarring in atelectasis at the right lung base. Lungs are hyperinflated. No focal  infiltrate or pulmonary edema. No pneumothorax.  IMPRESSION: No acute cardiopulmonary findings. No evidence pneumonia. Hyperinflated lungs with right basilar scarring and atelectasis.   Electronically Signed   By: Suzy Bouchard M.D.   On: 02/07/2015 16:27    Microbiology: Recent Results (from the past 240 hour(s))  MRSA PCR Screening     Status: None   Collection Time: 02/07/15 10:05 PM  Result Value Ref Range Status   MRSA by PCR NEGATIVE NEGATIVE Final    Comment:        The GeneXpert MRSA Assay (FDA approved for NASAL specimens only), is one component of a comprehensive MRSA colonization surveillance program. It is not intended to diagnose MRSA infection nor to guide or monitor treatment for MRSA infections.   Clostridium Difficile by PCR     Status: None   Collection Time: 02/11/15 12:53 AM  Result Value Ref Range Status   C difficile by pcr NEGATIVE NEGATIVE Final     Labs: Basic Metabolic Panel:  Recent Labs Lab 02/10/15 0412 02/11/15 0340 02/12/15 0330 02/13/15 0345 02/14/15 0402  NA 145 148* 148* 144 144  K 4.5 5.3* 5.2* 5.3* 3.9  CL 95* 100 103 100 96  CO2 43* 37* 38* 40* 40*  GLUCOSE 142* 116* 108* 119* 94  BUN 28* 30* 30* 25* 19  CREATININE 0.95 0.91 0.93 0.81 0.68  CALCIUM 10.2 10.5 10.2 10.4 10.2   Liver Function Tests: No results for input(s): AST, ALT, ALKPHOS, BILITOT, PROT, ALBUMIN in the last 168 hours. No results for input(s): LIPASE, AMYLASE in the last 168 hours. No results for input(s): AMMONIA in the last 168 hours. CBC:  Recent Labs Lab 02/09/15 0400 02/11/15 0340  WBC 7.0 7.9  HGB 9.6* 10.8*  HCT 28.7* 32.7*  MCV 90.0 90.6  PLT 144* 138*   Cardiac Enzymes: No results for input(s): CKTOTAL, CKMB, CKMBINDEX, TROPONINI in the last 168 hours. BNP: BNP (last 3 results) No results for input(s): BNP in the last 8760 hours.  ProBNP (last 3 results) No results for input(s): PROBNP in the last 8760 hours.  CBG: No results for  input(s): GLUCAP in the last 168 hours.     Signed:  Verdun Rackley A  [facility] 02/15/2015, 11:38 AM

## 2015-02-15 NOTE — Progress Notes (Signed)
Pt reports he has tried calling both sons, Elta Guadeloupe and [name] again, and cannot get an answer from either one. On call provider paged regarding possible cancel of d/c. Pt not safe to send home alone in my opinion, memory deficits evident and hospice level COPD pt. Will continue to try to contact sons. Hortencia Conradi RN

## 2015-02-15 NOTE — Progress Notes (Signed)
On call provider agreed to hold d/c tonight since we still are not able to contact either son. Will page him again if we contact son and are able to d/c pt. Hortencia Conradi RN

## 2015-02-15 NOTE — Progress Notes (Signed)
Attempted to call pt's son, Everhett Bozard, but did not get an answer. Message left for him. His other son, Wandra Mannan was notified that patient had been discharged and not able to get an answer from [place].

## 2015-02-15 NOTE — Progress Notes (Signed)
SATURATION QUALIFICATIONS: (This note is used to comply with regulatory documentation for home oxygen)  Patient Saturations on Room Air at Rest = 87%  Patient Saturations on Room Air while Ambulating = n/a%  Patient Saturations on 2.5 Liters of oxygen while Ambulating = n/a%  Please briefly explain why patient needs home oxygen: Pt was unable ambulate, so on 2.5 liters O2sat was 100% at rest.

## 2015-02-16 DIAGNOSIS — J9601 Acute respiratory failure with hypoxia: Secondary | ICD-10-CM

## 2015-02-16 MED ORDER — IPRATROPIUM-ALBUTEROL 0.5-2.5 (3) MG/3ML IN SOLN
3.0000 mL | Freq: Four times a day (QID) | RESPIRATORY_TRACT | Status: DC
  Administered 2015-02-16 (×2): 3 mL via RESPIRATORY_TRACT
  Filled 2015-02-16 (×2): qty 3

## 2015-02-16 NOTE — Plan of Care (Signed)
Problem: Discharge Progression Outcomes Goal: Able to self administer respiratory meds Outcome: Completed/Met Date Met:  02/16/15 Home health arranged

## 2015-02-16 NOTE — Plan of Care (Signed)
Problem: Discharge Progression Outcomes Goal: Home O2 if indicated Outcome: Completed/Met Date Met:  02/16/15 Two O2 tanks being sent home with pt

## 2015-02-16 NOTE — Plan of Care (Signed)
Problem: Discharge Progression Outcomes Goal: Discharge plan in place and appropriate Outcome: Adequate for Discharge Son coming to pick up pt.

## 2015-02-16 NOTE — Progress Notes (Signed)
Subjective: Discharge yesterday, his son did not, became up. He reported that he will stay with his son for the next 2 weeks, son is coming today to pick him up.  Objective: Filed Vitals:   02/16/15 0441  BP: 133/89  Pulse: 90  Temp: 98.7 F (37.1 C)  Resp:    General: Alert and awake, oriented x3, not in any acute distress. HEENT: anicteric sclera, pupils reactive to light and accommodation, EOMI CVS: S1-S2 clear, no murmur rubs or gallops Chest: clear to auscultation bilaterally, no wheezing, rales or rhonchi Abdomen: soft nontender, nondistended, normal bowel sounds, no organomegaly Extremities: no cyanosis, clubbing or edema noted bilaterally Neuro: Cranial nerves II-XII intact, no focal neurological deficits  Assessment and plan Acute on chronic respiratory failure Acute COPD exacerbation End-stage COPD Severe protein malnutrition secondary to chronic illness.  No changes clinically, discharged home today.  Birdie Hopes Pager: [phone] 02/16/2015, 12:07 PM

## 2015-02-16 NOTE — Plan of Care (Signed)
Problem: Discharge Progression Outcomes Goal: Dyspnea controlled Outcome: Adequate for Discharge Two Oxygen tanks being sent home with Pt Goal: O2 sats > or equal 90% or at baseline Outcome: Adequate for Discharge O2 sats 100% with 2 L O2

## 2015-02-17 DIAGNOSIS — D571 Sickle-cell disease without crisis: Secondary | ICD-10-CM | POA: Diagnosis not present

## 2015-02-17 DIAGNOSIS — M6281 Muscle weakness (generalized): Secondary | ICD-10-CM | POA: Diagnosis not present

## 2015-02-17 DIAGNOSIS — J449 Chronic obstructive pulmonary disease, unspecified: Secondary | ICD-10-CM | POA: Diagnosis not present

## 2015-02-17 DIAGNOSIS — I272 Other secondary pulmonary hypertension: Secondary | ICD-10-CM | POA: Diagnosis not present

## 2015-02-17 DIAGNOSIS — I2609 Other pulmonary embolism with acute cor pulmonale: Secondary | ICD-10-CM | POA: Diagnosis not present

## 2015-02-18 DIAGNOSIS — I272 Other secondary pulmonary hypertension: Secondary | ICD-10-CM | POA: Diagnosis not present

## 2015-02-18 DIAGNOSIS — D571 Sickle-cell disease without crisis: Secondary | ICD-10-CM | POA: Diagnosis not present

## 2015-02-18 DIAGNOSIS — I2609 Other pulmonary embolism with acute cor pulmonale: Secondary | ICD-10-CM | POA: Diagnosis not present

## 2015-02-18 DIAGNOSIS — J449 Chronic obstructive pulmonary disease, unspecified: Secondary | ICD-10-CM | POA: Diagnosis not present

## 2015-02-19 DIAGNOSIS — D571 Sickle-cell disease without crisis: Secondary | ICD-10-CM | POA: Diagnosis not present

## 2015-02-19 DIAGNOSIS — R259 Unspecified abnormal involuntary movements: Secondary | ICD-10-CM | POA: Diagnosis not present

## 2015-02-19 DIAGNOSIS — I2609 Other pulmonary embolism with acute cor pulmonale: Secondary | ICD-10-CM | POA: Diagnosis not present

## 2015-02-19 DIAGNOSIS — I272 Other secondary pulmonary hypertension: Secondary | ICD-10-CM | POA: Diagnosis not present

## 2015-02-19 DIAGNOSIS — J449 Chronic obstructive pulmonary disease, unspecified: Secondary | ICD-10-CM | POA: Diagnosis not present

## 2015-02-20 DIAGNOSIS — J449 Chronic obstructive pulmonary disease, unspecified: Secondary | ICD-10-CM | POA: Diagnosis not present

## 2015-02-21 ENCOUNTER — Non-Acute Institutional Stay (SKILLED_NURSING_FACILITY): Payer: Medicare Other | Admitting: Adult Health

## 2015-02-21 DIAGNOSIS — D571 Sickle-cell disease without crisis: Secondary | ICD-10-CM | POA: Diagnosis not present

## 2015-02-21 DIAGNOSIS — R911 Solitary pulmonary nodule: Secondary | ICD-10-CM | POA: Diagnosis not present

## 2015-02-21 DIAGNOSIS — K5900 Constipation, unspecified: Secondary | ICD-10-CM

## 2015-02-21 DIAGNOSIS — J301 Allergic rhinitis due to pollen: Secondary | ICD-10-CM

## 2015-02-21 DIAGNOSIS — J9611 Chronic respiratory failure with hypoxia: Secondary | ICD-10-CM

## 2015-02-21 DIAGNOSIS — J441 Chronic obstructive pulmonary disease with (acute) exacerbation: Secondary | ICD-10-CM

## 2015-02-21 DIAGNOSIS — R627 Adult failure to thrive: Secondary | ICD-10-CM

## 2015-02-21 DIAGNOSIS — J449 Chronic obstructive pulmonary disease, unspecified: Secondary | ICD-10-CM | POA: Diagnosis not present

## 2015-02-21 DIAGNOSIS — K5909 Other constipation: Secondary | ICD-10-CM

## 2015-02-22 DIAGNOSIS — J449 Chronic obstructive pulmonary disease, unspecified: Secondary | ICD-10-CM | POA: Diagnosis not present

## 2015-02-23 DIAGNOSIS — J449 Chronic obstructive pulmonary disease, unspecified: Secondary | ICD-10-CM | POA: Diagnosis not present

## 2015-02-24 DIAGNOSIS — J449 Chronic obstructive pulmonary disease, unspecified: Secondary | ICD-10-CM | POA: Diagnosis not present

## 2015-02-25 ENCOUNTER — Encounter: Payer: Self-pay | Admitting: Internal Medicine

## 2015-02-25 ENCOUNTER — Non-Acute Institutional Stay (SKILLED_NURSING_FACILITY): Payer: Medicare Other | Admitting: Internal Medicine

## 2015-02-25 DIAGNOSIS — J438 Other emphysema: Secondary | ICD-10-CM | POA: Diagnosis not present

## 2015-02-25 DIAGNOSIS — J301 Allergic rhinitis due to pollen: Secondary | ICD-10-CM | POA: Diagnosis not present

## 2015-02-25 DIAGNOSIS — R627 Adult failure to thrive: Secondary | ICD-10-CM

## 2015-02-25 DIAGNOSIS — E43 Unspecified severe protein-calorie malnutrition: Secondary | ICD-10-CM

## 2015-02-25 DIAGNOSIS — J9611 Chronic respiratory failure with hypoxia: Secondary | ICD-10-CM | POA: Diagnosis not present

## 2015-02-25 DIAGNOSIS — J449 Chronic obstructive pulmonary disease, unspecified: Secondary | ICD-10-CM | POA: Diagnosis not present

## 2015-02-25 DIAGNOSIS — R531 Weakness: Secondary | ICD-10-CM

## 2015-02-25 DIAGNOSIS — J9610 Chronic respiratory failure, unspecified whether with hypoxia or hypercapnia: Secondary | ICD-10-CM | POA: Insufficient documentation

## 2015-02-25 NOTE — Progress Notes (Signed)
Patient ID: Anthony [name], male   DOB: 1936-03-02, 79 y.o.   MRN: [id number]    HISTORY AND PHYSICAL  Location:  [facility] of Service: SNF [phone]   02/25/15  Extended Emergency Contact Information Primary Emergency Contact: Fallin,Kelly  [place] Phone: [phone] Relation: Son Secondary Emergency Contact: Bethanie Dicker Address: [address]          [place], [place] Phone: [phone] Relation: Son  Advanced Directive information  DNR; Hospice pt  Chief Complaint  Patient presents with  . New Admit To SNF    HPI:  79 yo male seen today as a new admission into SNF following hospital stay for COPD O2 dependent, FTT with severe protein calorie malnutrition hx sickle cell disease. He is a hospice pt.  He c/o heavy breathing x 2 days. He has not been taking his pain med. Denies CP or obvious SOB. He is looking forward to going home but is trying to get [facility] services resumed before he can leave. He does not exercise much due to poor exercise tolerance from COPD. Appetite is poor. He sleeps well. No nursing issues. No falls   Past Medical History  Diagnosis Date  . COPD (chronic obstructive pulmonary disease)   . Sickle cell anemia   . Pulmonary embolism     Past Surgical History  Procedure Laterality Date  . No past surgeries    . Colonoscopy N/A 03/09/2013    Procedure: COLONOSCOPY;  Surgeon: Beryle Beams, MD;  Location: [place];  Service: Endoscopy;  Laterality: N/A;  . Esophagogastroduodenoscopy N/A 03/09/2013    Procedure: ESOPHAGOGASTRODUODENOSCOPY (EGD);  Surgeon: Beryle Beams, MD;  Location: [facility] ENDOSCOPY;  Service: Endoscopy;  Laterality: N/A;    Patient Care Team: Nolene Ebbs, MD as PCP - General (Internal Medicine)  History   Social History  . Marital Status: Divorced    Spouse Name: N/A  . Number of Children: N/A  . Years of Education: N/A   Occupational  History  . Not on file.   Social History Main Topics  . Smoking status: Former Smoker    Quit date: 09/08/2014  . Smokeless tobacco: Never Used  . Alcohol Use: No  . Drug Use: No  . Sexual Activity: Not on file   Other Topics Concern  . Not on file   Social History Narrative     reports that he quit smoking about 5 months ago. He has never used smokeless tobacco. He reports that he does not drink alcohol or use illicit drugs.  Family History  Problem Relation Age of Onset  . Sickle cell anemia Mother    No family status information on file.     There is no immunization history on file for this patient.  No Known Allergies  Medications: Patient's Medications  New Prescriptions   No medications on file  Previous Medications   ALBUTEROL (PROVENTIL HFA;VENTOLIN HFA) 108 (90 BASE) MCG/ACT INHALER    Inhale 2 puffs into the lungs every 6 (six) hours as needed for wheezing or shortness of breath (wheezing and sob).   ALBUTEROL (PROVENTIL) (2.5 MG/3ML) 0.083% NEBULIZER SOLUTION    Take 3 mLs (2.5 mg total) by nebulization every 4 (four) hours as needed for wheezing or shortness of breath.   BISACODYL (DULCOLAX) 10 MG SUPPOSITORY    Place 1 suppository (10 mg total) rectally daily as needed for moderate constipation.   BUDESONIDE-FORMOTEROL (SYMBICORT) 160-4.5  MCG/ACT INHALER    Inhale 2 puffs into the lungs 2 (two) times daily.   CETIRIZINE (ZYRTEC) 10 MG TABLET    Take 10 mg by mouth daily.   CHOLECALCIFEROL (VITAMIN D) 2000 UNITS TABLET    Take 2,000 Units by mouth daily.   DOCUSATE SODIUM 100 MG CAPS    Take 100 mg by mouth 2 (two) times daily.   ENSURE (ENSURE)    Take 237 mLs by mouth 4 (four) times daily -  with meals and at bedtime.   FERROUS SULFATE 325 (65 FE) MG TABLET    Take 325 mg by mouth 2 (two) times daily with a meal.   FLUTICASONE (FLONASE) 50 MCG/ACT NASAL SPRAY    Place 2 sprays into the nose daily.   FOLIC ACID (FOLVITE) 308 MCG TABLET    Take 400 mcg by  mouth daily.   FUROSEMIDE (LASIX) 20 MG TABLET    Take 20 mg by mouth daily.   IPRATROPIUM-ALBUTEROL (COMBIVENT RESPIMAT IN)    Inhale 2 puffs into the lungs 3 (three) times daily.   IPRATROPIUM-ALBUTEROL (DUONEB) 0.5-2.5 (3) MG/3ML SOLN    Take 3 mLs by nebulization 4 (four) times daily -  before meals and at bedtime.   LEVOFLOXACIN (LEVAQUIN) 750 MG TABLET    Take 1 tablet (750 mg total) by mouth every other day.   LORAZEPAM (ATIVAN) 0.5 MG TABLET    Take 0.5 mg by mouth every 6 (six) hours as needed for anxiety.   MAGNESIUM CITRATE SOLN    Take 1 Bottle by mouth once.   MEGESTROL (MEGACE) 400 MG/10ML SUSPENSION    Take 10 mLs (400 mg total) by mouth daily.   MORPHINE (MSIR) 15 MG TABLET    Take 1 tablet (15 mg total) by mouth every 4 (four) hours as needed for moderate pain or severe pain (or shortness of breath).   PANTOPRAZOLE (PROTONIX) 40 MG TABLET    Take 1 tablet (40 mg total) by mouth daily at 12 noon.   POLYETHYLENE GLYCOL (MIRALAX / GLYCOLAX) PACKET    Take 17 g by mouth 2 (two) times daily.   POTASSIUM CHLORIDE (MICRO-K) 10 MEQ CR CAPSULE    Take 10 mEq by mouth daily.    PREDNISONE (DELTASONE) 20 MG TABLET    Take 2 tablets (40 mg total) by mouth daily with breakfast.   SENNA (SENOKOT) 8.6 MG TABS TABLET    Take 2 tablets (17.2 mg total) by mouth at bedtime.   SODIUM CHLORIDE (OCEAN) 0.65 % SOLN NASAL SPRAY    Place 1 spray into both nostrils as needed for congestion.   SODIUM PHOSPHATE (FLEET) ENEMA    Place 1 enema rectally daily as needed (constipation). follow package directions   VITAMIN D, ERGOCALCIFEROL, (DRISDOL) 50000 UNITS CAPS    Take 50,000 Units by mouth every 7 (seven) days. On mondays  Modified Medications   No medications on file  Discontinued Medications   No medications on file    Review of Systems  Constitutional: Positive for activity change and appetite change. Negative for chills and fatigue.  HENT: Negative for sore throat and trouble swallowing.   Eyes:  Negative for visual disturbance.  Respiratory: Positive for chest tightness. Negative for cough, shortness of breath and wheezing.   Cardiovascular: Negative for chest pain, palpitations and leg swelling.  Gastrointestinal: Negative for nausea, vomiting, abdominal pain and blood in stool.  Genitourinary: Negative for urgency, frequency and difficulty urinating.  Musculoskeletal: Positive for arthralgias and gait  problem.  Skin: Negative for rash.  Neurological: Negative for dizziness, seizures, weakness and headaches.  Psychiatric/Behavioral: Negative for confusion and sleep disturbance. The patient is not nervous/anxious.     Filed Vitals:   02/25/15 1607  BP: 140/78  Pulse: 107  Temp: 97.4 F (36.3 C)  Weight: 110 lb (49.896 kg)  SpO2: 96%   Body mass index is 15.78 kg/(m^2).  Physical Exam  Constitutional: He is oriented to person, place, and time. He appears well-developed. No distress.  Frail appearing in NAD. No conversational dyspnea. [place] @ 3L/min intact  HENT:  Mouth/Throat: Oropharynx is clear and moist.  Eyes: Pupils are equal, round, and reactive to light. No scleral icterus.  Neck: Neck supple. Carotid bruit is not present. No thyromegaly present.  Cardiovascular: Normal rate, regular rhythm, normal heart sounds and intact distal pulses.  Exam reveals no gallop and no friction rub.   No murmur heard. no distal LE swelling. No calf TTP  Pulmonary/Chest: Effort normal and breath sounds normal. He has no wheezes. He has no rales. He exhibits no tenderness.  Markedly reduced BS b/l.  Abdominal: Soft. Bowel sounds are normal. He exhibits no distension, no abdominal bruit, no pulsatile midline mass and no mass. There is no tenderness. There is no rebound and no guarding.  Lymphadenopathy:    He has no cervical adenopathy.  Neurological: He is alert and oriented to person, place, and time. He has normal reflexes.  Skin: Skin is warm and dry. No rash noted.  Psychiatric: He  has a normal mood and affect. His behavior is normal. Thought content normal.     Labs reviewed: Admission on 02/07/2015, Discharged on 02/16/2015  No results displayed because visit has over 200 results.     CBC Latest Ref Rng 02/11/2015 02/09/2015 02/08/2015  WBC 4.0 - 10.5 K/uL 7.9 7.0 4.3  Hemoglobin 13.0 - 17.0 g/dL 10.8(L) 9.6(L) 9.0(L)  Hematocrit 39.0 - 52.0 % 32.7(L) 28.7(L) 26.1(L)  Platelets 150 - 400 K/uL 138(L) 144(L) 114(L)    CMP Latest Ref Rng 02/14/2015 02/13/2015 02/12/2015  Glucose 70 - 99 mg/dL 94 119(H) 108(H)  BUN 6 - 23 mg/dL 19 25(H) 30(H)  Creatinine 0.50 - 1.35 mg/dL 0.68 0.81 0.93  Sodium 135 - 145 mmol/L 144 144 148(H)  Potassium 3.5 - 5.1 mmol/L 3.9 5.3(H) 5.2(H)  Chloride 96 - 112 mmol/L 96 100 103  CO2 19 - 32 mmol/L 40(HH) 40(HH) 38(H)  Calcium 8.4 - 10.5 mg/dL 10.2 10.4 10.2  Total Protein 6.0 - 8.3 g/dL - - -  Total Bilirubin 0.3 - 1.2 mg/dL - - -  Alkaline Phos 39 - 117 U/L - - -  AST 0 - 37 U/L - - -  ALT 0 - 53 U/L - - -     Dg Abd 1 View  02/08/2015   CLINICAL DATA:  Abdominal distention today.  EXAM: ABDOMEN - 1 VIEW  COMPARISON:  CT abdomen and pelvis 06/24/2014.  Abdomen 06/24/2014.  FINDINGS: Scattered gas and stool throughout the colon. No small or large bowel distention. Residual contrast material in the renal collecting systems and bladder. Fibrosis in the lung bases. Degenerative changes in the spine and hips. Bone sclerosis in the proximal femurs probably representing bone infarcts due to sickle cell disease.  IMPRESSION: Nonobstructive bowel gas pattern.   Electronically Signed   By: Lucienne Capers M.D.   On: 02/08/2015 01:23   Ct Head Wo Contrast  02/10/2015   CLINICAL DATA:  Altered mental status.  Sickle cell anemia.  EXAM: CT HEAD WITHOUT CONTRAST  TECHNIQUE: Contiguous axial images were obtained from the base of the skull through the vertex without intravenous contrast.  COMPARISON:  CT head without contrast 06/04/2014  FINDINGS:  Atrophy and diffuse white matter changes are again noted. There is a remote lacunar infarct within the right thalamus. Basal ganglia are otherwise intact. The insular cortex is intact bilaterally. Acute cortical defect is evident.  Postsurgical changes are again noted at the anterior wall right maxillary sinus. The paranasal sinuses and mastoid air cells are otherwise clear. The calvarium is intact. Scleral banding is noted on the right. The globes and orbits are otherwise intact.  IMPRESSION: 1. Stable atrophy and white matter disease. 2. No acute intracranial abnormality.   Electronically Signed   By: San Morelle M.D.   On: 02/10/2015 13:11   Ct Angio Chest Pe W/cm &/or Wo Cm  02/07/2015   CLINICAL DATA:  pt with Hx of COPD and Sickle Cell from home, sent by hospice nurse for SOB x 3 days, productive cough with yellow sputum, hospice nurse administered 3 separate nebulizer treatments without success.  EXAM: CT ANGIOGRAPHY CHEST WITH CONTRAST  TECHNIQUE: Multidetector CT imaging of the chest was performed using the standard protocol during bolus administration of intravenous contrast. Multiplanar CT image reconstructions and MIPs were obtained to evaluate the vascular anatomy.  CONTRAST:  69mL OMNIPAQUE IOHEXOL 350 MG/ML SOLN  COMPARISON:  06/25/2014 and 08/08/2009 as well as CT abdomen 03/01/2013  FINDINGS: Lungs are well inflated and demonstrate moderate diffuse bilateral centriacinar emphysematous disease. Minimal scarring/atelectasis over the posterior lung bases. No focal airspace consolidation or effusion. There is a enlarging spiculated nodule over the left lower lobe measuring 1.4 cm. Airways are within normal.  Heart is normal size. Pulmonary arterial system is within normal without evidence of emboli. No evidence of mediastinal hilar adenopathy. Remaining mediastinal structures are unremarkable.  Images through the upper abdomen are unchanged. Degenerative change with sclerosis and endplate  changes over the thoracic spine which are stable compatible with known sickle cell disease.  Review of the MIP images confirms the above findings.  IMPRESSION: No evidence of pulmonary embolism.  No acute cardiopulmonary disease. Minimal posterior bibasilar scarring/ atelectasis. Severe emphysematous disease.  1.4 cm spiculated nodule over the left lower lobe suspicious for primary malignancy. Recommend PET-CT for further evaluation.   Electronically Signed   By: Marin Olp M.D.   On: 02/07/2015 19:20   Dg Chest Port 1 View  02/07/2015   CLINICAL DATA:  Increase short of breath, COPD. Sickle cell disease.  EXAM: PORTABLE CHEST - 1 VIEW  COMPARISON:  Radiograph 06/24/2014  FINDINGS: Normal cardiac silhouette. There is chronic scarring in atelectasis at the right lung base. Lungs are hyperinflated. No focal infiltrate or pulmonary edema. No pneumothorax.  IMPRESSION: No acute cardiopulmonary findings. No evidence pneumonia. Hyperinflated lungs with right basilar scarring and atelectasis.   Electronically Signed   By: Suzy Bouchard M.D.   On: 02/07/2015 16:27     Assessment/Plan    ICD-9-CM ICD-10-CM   1. EMPHYSEMA, SEVERE - end stage 492.8 J43.8   2. Generalized weakness - due to #1 780.79 R53.1   3. Protein-calorie malnutrition, severe  262 E43   4. Adult failure to thrive - due to #3 783.7 R62.7   5. Allergic rhinitis due to pollen 477.0 J30.1   6. Chronic respiratory failure with hypoxia - due to #1 518.83 J96.11    799.02     --  cont [place] O2 up to 4L/min to keep O2 sat>89%  --cont levaquin as ordered  --cont other meds as ordered  --hospice following pt  --GOAL: short term rehab and d/c home when medically appropriate. Communicated with pt and nursing.  --will follow  Brockton Mckesson S. Perlie Gold  [facility] [address] [place], [place] [address] [phone] Office (Wednesdays and Fridays 8 AM - 5 PM) [phone] Cell  (Monday-Friday 8 AM - 5 PM)

## 2015-02-26 DIAGNOSIS — J449 Chronic obstructive pulmonary disease, unspecified: Secondary | ICD-10-CM | POA: Diagnosis not present

## 2015-02-27 DIAGNOSIS — J449 Chronic obstructive pulmonary disease, unspecified: Secondary | ICD-10-CM | POA: Diagnosis not present

## 2015-02-28 DIAGNOSIS — J449 Chronic obstructive pulmonary disease, unspecified: Secondary | ICD-10-CM | POA: Diagnosis not present

## 2015-03-01 DIAGNOSIS — J449 Chronic obstructive pulmonary disease, unspecified: Secondary | ICD-10-CM | POA: Diagnosis not present

## 2015-03-02 DIAGNOSIS — J449 Chronic obstructive pulmonary disease, unspecified: Secondary | ICD-10-CM | POA: Diagnosis not present

## 2015-03-03 DIAGNOSIS — J449 Chronic obstructive pulmonary disease, unspecified: Secondary | ICD-10-CM | POA: Diagnosis not present

## 2015-03-04 DIAGNOSIS — J449 Chronic obstructive pulmonary disease, unspecified: Secondary | ICD-10-CM | POA: Diagnosis not present

## 2015-03-05 DIAGNOSIS — J449 Chronic obstructive pulmonary disease, unspecified: Secondary | ICD-10-CM | POA: Diagnosis not present

## 2015-03-06 DIAGNOSIS — J449 Chronic obstructive pulmonary disease, unspecified: Secondary | ICD-10-CM | POA: Diagnosis not present

## 2015-03-07 DIAGNOSIS — J449 Chronic obstructive pulmonary disease, unspecified: Secondary | ICD-10-CM | POA: Diagnosis not present

## 2015-03-08 DIAGNOSIS — J449 Chronic obstructive pulmonary disease, unspecified: Secondary | ICD-10-CM | POA: Diagnosis not present

## 2015-03-09 DIAGNOSIS — J449 Chronic obstructive pulmonary disease, unspecified: Secondary | ICD-10-CM | POA: Diagnosis not present

## 2015-03-10 DIAGNOSIS — J449 Chronic obstructive pulmonary disease, unspecified: Secondary | ICD-10-CM | POA: Diagnosis not present

## 2015-03-11 DIAGNOSIS — J449 Chronic obstructive pulmonary disease, unspecified: Secondary | ICD-10-CM | POA: Diagnosis not present

## 2015-03-12 DIAGNOSIS — J449 Chronic obstructive pulmonary disease, unspecified: Secondary | ICD-10-CM | POA: Diagnosis not present

## 2015-03-13 DIAGNOSIS — J449 Chronic obstructive pulmonary disease, unspecified: Secondary | ICD-10-CM | POA: Diagnosis not present

## 2015-03-14 ENCOUNTER — Non-Acute Institutional Stay (SKILLED_NURSING_FACILITY): Payer: Medicare Other | Admitting: Adult Health

## 2015-03-14 DIAGNOSIS — J441 Chronic obstructive pulmonary disease with (acute) exacerbation: Secondary | ICD-10-CM | POA: Diagnosis not present

## 2015-03-14 DIAGNOSIS — D571 Sickle-cell disease without crisis: Secondary | ICD-10-CM

## 2015-03-14 DIAGNOSIS — J9611 Chronic respiratory failure with hypoxia: Secondary | ICD-10-CM

## 2015-03-14 DIAGNOSIS — Z86718 Personal history of other venous thrombosis and embolism: Secondary | ICD-10-CM | POA: Diagnosis not present

## 2015-03-14 DIAGNOSIS — J449 Chronic obstructive pulmonary disease, unspecified: Secondary | ICD-10-CM | POA: Diagnosis not present

## 2015-03-15 DIAGNOSIS — J449 Chronic obstructive pulmonary disease, unspecified: Secondary | ICD-10-CM | POA: Diagnosis not present

## 2015-03-16 DIAGNOSIS — J449 Chronic obstructive pulmonary disease, unspecified: Secondary | ICD-10-CM | POA: Diagnosis not present

## 2015-03-17 DIAGNOSIS — J449 Chronic obstructive pulmonary disease, unspecified: Secondary | ICD-10-CM | POA: Diagnosis not present

## 2015-03-18 DIAGNOSIS — J449 Chronic obstructive pulmonary disease, unspecified: Secondary | ICD-10-CM | POA: Diagnosis not present

## 2015-03-19 DIAGNOSIS — M6281 Muscle weakness (generalized): Secondary | ICD-10-CM | POA: Diagnosis not present

## 2015-03-19 DIAGNOSIS — J449 Chronic obstructive pulmonary disease, unspecified: Secondary | ICD-10-CM | POA: Diagnosis not present

## 2015-03-20 DIAGNOSIS — J449 Chronic obstructive pulmonary disease, unspecified: Secondary | ICD-10-CM | POA: Diagnosis not present

## 2015-03-21 DIAGNOSIS — J449 Chronic obstructive pulmonary disease, unspecified: Secondary | ICD-10-CM | POA: Diagnosis not present

## 2015-03-22 DIAGNOSIS — J449 Chronic obstructive pulmonary disease, unspecified: Secondary | ICD-10-CM | POA: Diagnosis not present

## 2015-03-23 DIAGNOSIS — J449 Chronic obstructive pulmonary disease, unspecified: Secondary | ICD-10-CM | POA: Diagnosis not present

## 2015-03-24 ENCOUNTER — Other Ambulatory Visit: Payer: Self-pay | Admitting: *Deleted

## 2015-03-24 DIAGNOSIS — J449 Chronic obstructive pulmonary disease, unspecified: Secondary | ICD-10-CM | POA: Diagnosis not present

## 2015-03-24 MED ORDER — MORPHINE SULFATE 30 MG PO TABS: ORAL_TABLET | ORAL | Status: AC

## 2015-03-24 NOTE — Telephone Encounter (Signed)
[id number] 

## 2015-03-25 DIAGNOSIS — J449 Chronic obstructive pulmonary disease, unspecified: Secondary | ICD-10-CM | POA: Diagnosis not present

## 2015-03-26 DIAGNOSIS — J449 Chronic obstructive pulmonary disease, unspecified: Secondary | ICD-10-CM | POA: Diagnosis not present

## 2015-03-27 DIAGNOSIS — J449 Chronic obstructive pulmonary disease, unspecified: Secondary | ICD-10-CM | POA: Diagnosis not present

## 2015-03-28 DIAGNOSIS — J449 Chronic obstructive pulmonary disease, unspecified: Secondary | ICD-10-CM | POA: Diagnosis not present

## 2015-03-29 DIAGNOSIS — J449 Chronic obstructive pulmonary disease, unspecified: Secondary | ICD-10-CM | POA: Diagnosis not present

## 2015-03-30 DIAGNOSIS — J449 Chronic obstructive pulmonary disease, unspecified: Secondary | ICD-10-CM | POA: Diagnosis not present

## 2015-03-31 DIAGNOSIS — J449 Chronic obstructive pulmonary disease, unspecified: Secondary | ICD-10-CM | POA: Diagnosis not present

## 2015-04-01 ENCOUNTER — Non-Acute Institutional Stay (SKILLED_NURSING_FACILITY): Payer: Medicare Other | Admitting: Internal Medicine

## 2015-04-01 ENCOUNTER — Encounter: Payer: Self-pay | Admitting: Internal Medicine

## 2015-04-01 DIAGNOSIS — J449 Chronic obstructive pulmonary disease, unspecified: Secondary | ICD-10-CM | POA: Diagnosis not present

## 2015-04-01 DIAGNOSIS — R627 Adult failure to thrive: Secondary | ICD-10-CM | POA: Diagnosis not present

## 2015-04-01 DIAGNOSIS — J301 Allergic rhinitis due to pollen: Secondary | ICD-10-CM | POA: Diagnosis not present

## 2015-04-01 DIAGNOSIS — J438 Other emphysema: Secondary | ICD-10-CM | POA: Diagnosis not present

## 2015-04-01 DIAGNOSIS — R531 Weakness: Secondary | ICD-10-CM | POA: Diagnosis not present

## 2015-04-01 DIAGNOSIS — J9611 Chronic respiratory failure with hypoxia: Secondary | ICD-10-CM | POA: Diagnosis not present

## 2015-04-01 DIAGNOSIS — E43 Unspecified severe protein-calorie malnutrition: Secondary | ICD-10-CM

## 2015-04-01 NOTE — Progress Notes (Signed)
Patient ID: Anthony [name], male   DOB: 04/16/1936, 79 y.o.   MRN: [id number]      DATE: 04/01/15  Location:  [place] of Service: SNF (31)   Extended Emergency Contact Information Primary Emergency Contact: Christians,Kelly  [place] Phone: [phone] Relation: Son Secondary Emergency Contact: Bethanie Dicker Address: [address]          [place], [place] Phone: [phone] Relation: Son  Advanced Directive information  DNR; [facility] PT  Chief Complaint  Patient presents with  . Discharge Note    HPI:  79 yo male seen today for d/c from SNF following short term rehab for emphysema, weakness, protein calorie malnutrition, adult FTT, chronic respiratory failure with hypoxia on chronic  O2. He has completed rehab and will be d/c'd to home with previous [facility] orders. He c/o wheezing and SOB today. No CP   Past Medical History  Diagnosis Date  . COPD (chronic obstructive pulmonary disease)   . Sickle cell anemia   . Pulmonary embolism     Past Surgical History  Procedure Laterality Date  . No past surgeries    . Colonoscopy N/A 03/09/2013    Procedure: COLONOSCOPY;  Surgeon: Beryle Beams, MD;  Location: [place];  Service: Endoscopy;  Laterality: N/A;  . Esophagogastroduodenoscopy N/A 03/09/2013    Procedure: ESOPHAGOGASTRODUODENOSCOPY (EGD);  Surgeon: Beryle Beams, MD;  Location: [facility]COPY;  Service: Endoscopy;  Laterality: N/A;    Patient Care Team: Nolene Ebbs, MD as PCP - General (Internal Medicine)  History   Social History  . Marital Status: Divorced    Spouse Name: N/A  . Number of Children: N/A  . Years of Education: N/A   Occupational History  . Not on file.   Social History Main Topics  . Smoking status: Former Smoker    Quit date: 09/08/2014  . Smokeless tobacco: Never Used  . Alcohol Use: No  . Drug Use: No  . Sexual Activity: Not on file   Other  Topics Concern  . Not on file   Social History Narrative     reports that he quit smoking about 6 months ago. He has never used smokeless tobacco. He reports that he does not drink alcohol or use illicit drugs.   There is no immunization history on file for this patient.  No Known Allergies  Medications: Patient's Medications  New Prescriptions   No medications on file  Previous Medications   ALBUTEROL (PROVENTIL HFA;VENTOLIN HFA) 108 (90 BASE) MCG/ACT INHALER    Inhale 2 puffs into the lungs every 6 (six) hours as needed for wheezing or shortness of breath (wheezing and sob).   ALBUTEROL (PROVENTIL) (2.5 MG/3ML) 0.083% NEBULIZER SOLUTION    Take 3 mLs (2.5 mg total) by nebulization every 4 (four) hours as needed for wheezing or shortness of breath.   BISACODYL (DULCOLAX) 10 MG SUPPOSITORY    Place 1 suppository (10 mg total) rectally daily as needed for moderate constipation.   BUDESONIDE-FORMOTEROL (SYMBICORT) 160-4.5 MCG/ACT INHALER    Inhale 2 puffs into the lungs 2 (two) times daily.   CETIRIZINE (ZYRTEC) 10 MG TABLET    Take 10 mg by mouth daily.   CHOLECALCIFEROL (VITAMIN D) 2000 UNITS TABLET    Take 2,000 Units by mouth daily.   DOCUSATE SODIUM 100 MG CAPS    Take 100 mg by mouth 2 (two) times daily.   ENSURE (ENSURE)  Take 237 mLs by mouth 4 (four) times daily -  with meals and at bedtime.   FERROUS SULFATE 325 (65 FE) MG TABLET    Take 325 mg by mouth 2 (two) times daily with a meal.   FLUTICASONE (FLONASE) 50 MCG/ACT NASAL SPRAY    Place 2 sprays into the nose daily.   FOLIC ACID (FOLVITE) 295 MCG TABLET    Take 400 mcg by mouth daily.   FUROSEMIDE (LASIX) 20 MG TABLET    Take 20 mg by mouth daily.   IPRATROPIUM-ALBUTEROL (COMBIVENT RESPIMAT IN)    Inhale 2 puffs into the lungs 3 (three) times daily.   IPRATROPIUM-ALBUTEROL (DUONEB) 0.5-2.5 (3) MG/3ML SOLN    Take 3 mLs by nebulization 4 (four) times daily -  before meals and at bedtime.   LEVOFLOXACIN (LEVAQUIN) 750 MG  TABLET    Take 1 tablet (750 mg total) by mouth every other day.   LORAZEPAM (ATIVAN) 0.5 MG TABLET    Take 0.5 mg by mouth every 6 (six) hours as needed for anxiety.   MAGNESIUM CITRATE SOLN    Take 1 Bottle by mouth once.   MEGESTROL (MEGACE) 400 MG/10ML SUSPENSION    Take 10 mLs (400 mg total) by mouth daily.   MORPHINE (MSIR) 15 MG TABLET    Take 1 tablet (15 mg total) by mouth every 4 (four) hours as needed for moderate pain or severe pain (or shortness of breath).   MORPHINE (MSIR) 30 MG TABLET    Take one tablet by mouth at bedtime for pain   PANTOPRAZOLE (PROTONIX) 40 MG TABLET    Take 1 tablet (40 mg total) by mouth daily at 12 noon.   POLYETHYLENE GLYCOL (MIRALAX / GLYCOLAX) PACKET    Take 17 g by mouth 2 (two) times daily.   POTASSIUM CHLORIDE (MICRO-K) 10 MEQ CR CAPSULE    Take 10 mEq by mouth daily.    PREDNISONE (DELTASONE) 20 MG TABLET    Take 2 tablets (40 mg total) by mouth daily with breakfast.   SENNA (SENOKOT) 8.6 MG TABS TABLET    Take 2 tablets (17.2 mg total) by mouth at bedtime.   SODIUM CHLORIDE (OCEAN) 0.65 % SOLN NASAL SPRAY    Place 1 spray into both nostrils as needed for congestion.   SODIUM PHOSPHATE (FLEET) ENEMA    Place 1 enema rectally daily as needed (constipation). follow package directions   VITAMIN D, ERGOCALCIFEROL, (DRISDOL) 50000 UNITS CAPS    Take 50,000 Units by mouth every 7 (seven) days. On mondays  Modified Medications   No medications on file  Discontinued Medications   No medications on file    Review of Systems  Constitutional: Positive for fatigue.  Respiratory: Positive for chest tightness, shortness of breath and wheezing.   Neurological: Positive for weakness.  All other systems reviewed and are negative.   Filed Vitals:   04/01/15 1710  BP: 122/60  Pulse: 90  Temp: 97.7 F (36.5 C)  Weight: 114 lb (51.71 kg)  SpO2: 95%   Body mass index is 16.36 kg/(m^2).  Physical Exam  Constitutional: He is oriented to person, place, and  time. He appears well-developed. No distress.  Frail appearing in NAD. [place] O2 intact  HENT:  Mouth/Throat: Oropharynx is clear and moist.  Eyes: Pupils are equal, round, and reactive to light. No scleral icterus.  Neck: Neck supple. Carotid bruit is not present.  Cardiovascular: Normal rate, regular rhythm, normal heart sounds and intact distal pulses.  Exam reveals no gallop and no friction rub.   No murmur heard. no distal LE swelling. No calf TTP  Pulmonary/Chest: Effort normal. He has decreased breath sounds. He has no wheezes. He has no rales. He exhibits no tenderness.  Abdominal: Soft. Bowel sounds are normal. He exhibits no distension, no abdominal bruit, no pulsatile midline mass and no mass. There is no tenderness. There is no rebound and no guarding.  Lymphadenopathy:    He has no cervical adenopathy.  Neurological: He is alert and oriented to person, place, and time.  Skin: Skin is warm and dry. No rash noted.  Psychiatric: He has a normal mood and affect. His behavior is normal. Thought content normal.     Labs reviewed: Admission on 02/07/2015, Discharged on 02/16/2015  No results displayed because visit has over 200 results.     Recent Results (from the past 2160 hour(s))  Basic metabolic panel    (if pt has PMH of COPD)     Status: Abnormal   Collection Time: 02/07/15  4:00 PM  Result Value Ref Range   Sodium 140 135 - 145 mmol/L   Potassium 3.3 (L) 3.5 - 5.1 mmol/L   Chloride 90 (L) 96 - 112 mmol/L   CO2 41 (HH) 19 - 32 mmol/L    Comment: REPEATED TO VERIFY CRITICAL RESULT CALLED TO, READ BACK BY AND VERIFIED WITH: M.HAMBY AT 1704 ON 4.8.16 BY W.BUCHHOLZ.    Glucose, Bld 142 (H) 70 - 99 mg/dL   BUN 15 6 - 23 mg/dL   Creatinine, Ser 1.43 (H) 0.50 - 1.35 mg/dL   Calcium 9.2 8.4 - 10.5 mg/dL   GFR calc non Af Amer 46 (L) >90 mL/min   GFR calc Af Amer 53 (L) >90 mL/min    Comment: (NOTE) The eGFR has been calculated using the CKD EPI equation. This calculation  has not been validated in all clinical situations. eGFR's persistently <90 mL/min signify possible Chronic Kidney Disease.    Anion gap 9 5 - 15  CBC     (if pt has PMH of COPD)     Status: Abnormal   Collection Time: 02/07/15  4:00 PM  Result Value Ref Range   WBC 7.2 4.0 - 10.5 K/uL   RBC 2.95 (L) 4.22 - 5.81 MIL/uL   Hemoglobin 9.0 (L) 13.0 - 17.0 g/dL   HCT 26.6 (L) 39.0 - 52.0 %   MCV 90.2 78.0 - 100.0 fL   MCH 30.5 26.0 - 34.0 pg   MCHC 33.8 30.0 - 36.0 g/dL   RDW 20.5 (H) 11.5 - 15.5 %   Platelets 168 150 - 400 K/uL  I-stat troponin, ED (if patient has history of COPD)     Status: None   Collection Time: 02/07/15  4:11 PM  Result Value Ref Range   Troponin i, poc 0.02 0.00 - 0.08 ng/mL   Comment 3            Comment: Due to the release kinetics of cTnI, a negative result within the first hours of the onset of symptoms does not rule out myocardial infarction with certainty. If myocardial infarction is still suspected, repeat the test at appropriate intervals.   Blood gas, arterial     Status: Abnormal   Collection Time: 02/07/15  4:53 PM  Result Value Ref Range   FIO2 0.31 %   Delivery systems VENTURI MASK    pH, Arterial 7.451 (H) 7.350 - 7.450   pCO2 arterial 61.8 (HH) 35.0 -  45.0 mmHg    Comment: CRITICAL RESULT CALLED TO, READ BACK BY AND VERIFIED WITH: MEGAN DOCHERTY MD BY ANGIE DUNLAP RRT RCP AT 6759 ON 02/07/15    pO2, Arterial 42.5 (L) 80.0 - 100.0 mmHg   Bicarbonate 42.4 (H) 20.0 - 24.0 mEq/L   TCO2 39.5 0 - 100 mmol/L   Acid-Base Excess 16.2 (H) 0.0 - 2.0 mmol/L   O2 Saturation 77.9 %   Patient temperature 98.6    Collection site RIGHT RADIAL    Drawn by COLLECTED BY RT    Sample type ARTERIAL DRAW    Allens test (pass/fail) PASS PASS  Blood gas, arterial     Status: Abnormal   Collection Time: 02/07/15  8:08 PM  Result Value Ref Range   FIO2 0.35 %   Delivery systems BILEVEL POSITIVE AIRWAY PRESSURE    Mode BILEVEL POSITIVE AIRWAY PRESSURE     Inspiratory PAP 12.0    Expiratory PAP 6.0    pH, Arterial 7.380 7.350 - 7.450   pCO2 arterial 70.0 (HH) 35.0 - 45.0 mmHg    Comment: CRITICAL RESULT CALLED TO, READ BACK BY AND VERIFIED WITH:  CHICHI OKOROJI, RN AT 2017 BY T KNAPP, RRT, RCP ON 02/07/15    pO2, Arterial 57.7 (L) 80.0 - 100.0 mmHg   Bicarbonate 40.4 (H) 20.0 - 24.0 mEq/L   TCO2 38.3 0 - 100 mmol/L   Acid-Base Excess 13.5 (H) 0.0 - 2.0 mmol/L   O2 Saturation 87.0 %   Patient temperature 98.6    Collection site RIGHT RADIAL    Drawn by [id number]    Sample type ARTERIAL DRAW    Allens test (pass/fail) PASS PASS  MRSA PCR Screening     Status: None   Collection Time: 02/07/15 10:05 PM  Result Value Ref Range   MRSA by PCR NEGATIVE NEGATIVE    Comment:        The GeneXpert MRSA Assay (FDA approved for NASAL specimens only), is one component of a comprehensive MRSA colonization surveillance program. It is not intended to diagnose MRSA infection nor to guide or monitor treatment for MRSA infections.   Influenza panel by PCR (type A & B, H1N1)     Status: None   Collection Time: 02/08/15 12:14 AM  Result Value Ref Range   Influenza A By PCR NEGATIVE NEGATIVE   Influenza B By PCR NEGATIVE NEGATIVE   H1N1 flu by pcr NOT DETECTED NOT DETECTED    Comment:        The Xpert Flu assay (FDA approved for nasal aspirates or washes and nasopharyngeal swab specimens), is intended as an aid in the diagnosis of influenza and should not be used as a sole basis for treatment. Performed at [facility]   Magnesium     Status: None   Collection Time: 02/08/15  3:50 AM  Result Value Ref Range   Magnesium 2.3 1.5 - 2.5 mg/dL  Phosphorus     Status: None   Collection Time: 02/08/15  3:50 AM  Result Value Ref Range   Phosphorus 4.6 2.3 - 4.6 mg/dL  TSH     Status: Abnormal   Collection Time: 02/08/15  3:50 AM  Result Value Ref Range   TSH 0.251 (L) 0.350 - 4.500 uIU/mL  Comprehensive metabolic panel     Status: Abnormal    Collection Time: 02/08/15  3:50 AM  Result Value Ref Range   Sodium 142 135 - 145 mmol/L   Potassium 4.8 3.5 - 5.1 mmol/L  Comment: DELTA CHECK NOTED REPEATED TO VERIFY NO VISIBLE HEMOLYSIS    Chloride 93 (L) 96 - 112 mmol/L   CO2 41 (HH) 19 - 32 mmol/L    Comment: REPEATED TO VERIFY CRITICAL RESULT CALLED TO, READ BACK BY AND VERIFIED WITH: B DAVIS RN [phone] 02/08/15 A NAVARRO    Glucose, Bld 142 (H) 70 - 99 mg/dL   BUN 14 6 - 23 mg/dL   Creatinine, Ser 1.34 0.50 - 1.35 mg/dL   Calcium 9.2 8.4 - 10.5 mg/dL   Total Protein 5.9 (L) 6.0 - 8.3 g/dL   Albumin 3.5 3.5 - 5.2 g/dL   AST 35 0 - 37 U/L   ALT 13 0 - 53 U/L   Alkaline Phosphatase 50 39 - 117 U/L   Total Bilirubin 0.8 0.3 - 1.2 mg/dL   GFR calc non Af Amer 50 (L) >90 mL/min   GFR calc Af Amer 58 (L) >90 mL/min    Comment: (NOTE) The eGFR has been calculated using the CKD EPI equation. This calculation has not been validated in all clinical situations. eGFR's persistently <90 mL/min signify possible Chronic Kidney Disease.    Anion gap 8 5 - 15  CBC     Status: Abnormal   Collection Time: 02/08/15  3:50 AM  Result Value Ref Range   WBC 4.3 4.0 - 10.5 K/uL   RBC 2.88 (L) 4.22 - 5.81 MIL/uL   Hemoglobin 9.0 (L) 13.0 - 17.0 g/dL   HCT 26.1 (L) 39.0 - 52.0 %   MCV 90.6 78.0 - 100.0 fL   MCH 31.3 26.0 - 34.0 pg   MCHC 34.5 30.0 - 36.0 g/dL   RDW 20.6 (H) 11.5 - 15.5 %   Platelets 114 (L) 150 - 400 K/uL    Comment: DELTA CHECK NOTED SPECIMEN CHECKED FOR CLOTS REPEATED TO VERIFY PLATELET COUNT CONFIRMED BY SMEAR   CBC     Status: Abnormal   Collection Time: 02/09/15  4:00 AM  Result Value Ref Range   WBC 7.0 4.0 - 10.5 K/uL   RBC 3.19 (L) 4.22 - 5.81 MIL/uL   Hemoglobin 9.6 (L) 13.0 - 17.0 g/dL   HCT 28.7 (L) 39.0 - 52.0 %   MCV 90.0 78.0 - 100.0 fL   MCH 30.1 26.0 - 34.0 pg   MCHC 33.4 30.0 - 36.0 g/dL   RDW 19.9 (H) 11.5 - 15.5 %   Platelets 144 (L) 150 - 400 K/uL    Comment: REPEATED TO  VERIFY SPECIMEN CHECKED FOR CLOTS   Basic metabolic panel     Status: Abnormal   Collection Time: 02/09/15  4:00 AM  Result Value Ref Range   Sodium 142 135 - 145 mmol/L   Potassium 5.0 3.5 - 5.1 mmol/L   Chloride 94 (L) 96 - 112 mmol/L   CO2 41 (HH) 19 - 32 mmol/L    Comment: REPEATED TO VERIFY CRITICAL RESULT CALLED TO, READ BACK BY AND VERIFIED WITH: B.DAVIS,RN AT 0450 ON 02/09/15 BY W.SHEA    Glucose, Bld 127 (H) 70 - 99 mg/dL   BUN 24 (H) 6 - 23 mg/dL   Creatinine, Ser 1.12 0.50 - 1.35 mg/dL   Calcium 9.5 8.4 - 10.5 mg/dL   GFR calc non Af Amer 62 (L) >90 mL/min   GFR calc Af Amer 72 (L) >90 mL/min    Comment: (NOTE) The eGFR has been calculated using the CKD EPI equation. This calculation has not been validated in all clinical situations. eGFR's persistently <  90 mL/min signify possible Chronic Kidney Disease.    Anion gap 7 5 - 15  TSH     Status: Abnormal   Collection Time: 02/09/15  4:00 AM  Result Value Ref Range   TSH 0.180 (L) 0.350 - 4.500 uIU/mL  T4, free     Status: None   Collection Time: 02/09/15  4:00 AM  Result Value Ref Range   Free T4 0.98 0.80 - 1.80 ng/dL    Comment: Performed at [facility]  Blood gas, arterial     Status: Abnormal   Collection Time: 02/09/15  8:05 PM  Result Value Ref Range   FIO2 0.55 %   Delivery systems VENTURI MASK    pH, Arterial 7.349 (L) 7.350 - 7.450   pCO2 arterial 78.0 (HH) 35.0 - 45.0 mmHg    Comment: CRITICAL RESULT CALLED TO, READ BACK BY AND VERIFIED WITH: Antony Blackbird, RN AT 2012 BY ANNALISSA AGUSTIN,RRT,RCP ON 02/09/15    pO2, Arterial 171.0 (H) 80.0 - 100.0 mmHg   Bicarbonate 41.9 (H) 20.0 - 24.0 mEq/L   TCO2 39.5 0 - 100 mmol/L   Acid-Base Excess 14.1 (H) 0.0 - 2.0 mmol/L   O2 Saturation 99.8 %   Patient temperature 98.6    Collection site RIGHT RADIAL    Drawn by [id number]    Sample type ARTERIAL    Allens test (pass/fail) PASS PASS  Basic metabolic panel     Status: Abnormal   Collection Time:  02/10/15  4:12 AM  Result Value Ref Range   Sodium 145 135 - 145 mmol/L   Potassium 4.5 3.5 - 5.1 mmol/L   Chloride 95 (L) 96 - 112 mmol/L   CO2 43 (HH) 19 - 32 mmol/L    Comment: CRITICAL RESULT CALLED TO, READ BACK BY AND VERIFIED WITH: SPOKE WITH MORGAN,M RN [phone] [place],N REPEATED TO VERIFY    Glucose, Bld 142 (H) 70 - 99 mg/dL   BUN 28 (H) 6 - 23 mg/dL   Creatinine, Ser 0.95 0.50 - 1.35 mg/dL   Calcium 10.2 8.4 - 10.5 mg/dL   GFR calc non Af Amer 79 (L) >90 mL/min   GFR calc Af Amer >90 >90 mL/min    Comment: (NOTE) The eGFR has been calculated using the CKD EPI equation. This calculation has not been validated in all clinical situations. eGFR's persistently <90 mL/min signify possible Chronic Kidney Disease.    Anion gap 7 5 - 15  Clostridium Difficile by PCR     Status: None   Collection Time: 02/11/15 12:53 AM  Result Value Ref Range   C difficile by pcr NEGATIVE NEGATIVE  CBC     Status: Abnormal   Collection Time: 02/11/15  3:40 AM  Result Value Ref Range   WBC 7.9 4.0 - 10.5 K/uL   RBC 3.61 (L) 4.22 - 5.81 MIL/uL   Hemoglobin 10.8 (L) 13.0 - 17.0 g/dL   HCT 32.7 (L) 39.0 - 52.0 %   MCV 90.6 78.0 - 100.0 fL   MCH 29.9 26.0 - 34.0 pg   MCHC 33.0 30.0 - 36.0 g/dL   RDW 19.5 (H) 11.5 - 15.5 %   Platelets 138 (L) 150 - 400 K/uL  Basic metabolic panel     Status: Abnormal   Collection Time: 02/11/15  3:40 AM  Result Value Ref Range   Sodium 148 (H) 135 - 145 mmol/L   Potassium 5.3 (H) 3.5 - 5.1 mmol/L   Chloride 100 96 - 112 mmol/L  CO2 37 (H) 19 - 32 mmol/L   Glucose, Bld 116 (H) 70 - 99 mg/dL   BUN 30 (H) 6 - 23 mg/dL   Creatinine, Ser 0.91 0.50 - 1.35 mg/dL   Calcium 10.5 8.4 - 10.5 mg/dL   GFR calc non Af Amer 80 (L) >90 mL/min   GFR calc Af Amer >90 >90 mL/min    Comment: (NOTE) The eGFR has been calculated using the CKD EPI equation. This calculation has not been validated in all clinical situations. eGFR's persistently <90 mL/min signify  possible Chronic Kidney Disease.    Anion gap 11 5 - 15  Basic metabolic panel     Status: Abnormal   Collection Time: 02/12/15  3:30 AM  Result Value Ref Range   Sodium 148 (H) 135 - 145 mmol/L   Potassium 5.2 (H) 3.5 - 5.1 mmol/L   Chloride 103 96 - 112 mmol/L   CO2 38 (H) 19 - 32 mmol/L   Glucose, Bld 108 (H) 70 - 99 mg/dL   BUN 30 (H) 6 - 23 mg/dL   Creatinine, Ser 0.93 0.50 - 1.35 mg/dL   Calcium 10.2 8.4 - 10.5 mg/dL   GFR calc non Af Amer 80 (L) >90 mL/min   GFR calc Af Amer >90 >90 mL/min    Comment: (NOTE) The eGFR has been calculated using the CKD EPI equation. This calculation has not been validated in all clinical situations. eGFR's persistently <90 mL/min signify possible Chronic Kidney Disease.    Anion gap 7 5 - 15  Basic metabolic panel     Status: Abnormal   Collection Time: 02/13/15  3:45 AM  Result Value Ref Range   Sodium 144 135 - 145 mmol/L   Potassium 5.3 (H) 3.5 - 5.1 mmol/L   Chloride 100 96 - 112 mmol/L   CO2 40 (HH) 19 - 32 mmol/L    Comment: CRITICAL RESULT CALLED TO, READ BACK BY AND VERIFIED WITH: B DAVIS RN [phone] 02/13/15 A NAVARRO    Glucose, Bld 119 (H) 70 - 99 mg/dL   BUN 25 (H) 6 - 23 mg/dL   Creatinine, Ser 0.81 0.50 - 1.35 mg/dL   Calcium 10.4 8.4 - 10.5 mg/dL   GFR calc non Af Amer 84 (L) >90 mL/min   GFR calc Af Amer >90 >90 mL/min    Comment: (NOTE) The eGFR has been calculated using the CKD EPI equation. This calculation has not been validated in all clinical situations. eGFR's persistently <90 mL/min signify possible Chronic Kidney Disease.    Anion gap 4 (L) 5 - 15  Basic metabolic panel     Status: Abnormal   Collection Time: 02/14/15  4:02 AM  Result Value Ref Range   Sodium 144 135 - 145 mmol/L   Potassium 3.9 3.5 - 5.1 mmol/L    Comment: DELTA CHECK NOTED REPEATED TO VERIFY    Chloride 96 96 - 112 mmol/L   CO2 40 (HH) 19 - 32 mmol/L    Comment: REPEATED TO VERIFY CRITICAL RESULT CALLED TO, READ BACK BY AND  VERIFIED WITH: MILLS,M/3W @0452  ON 02/14/15 BY KARCZEWSKI,S.    Glucose, Bld 94 70 - 99 mg/dL   BUN 19 6 - 23 mg/dL   Creatinine, Ser 0.68 0.50 - 1.35 mg/dL   Calcium 10.2 8.4 - 10.5 mg/dL   GFR calc non Af Amer >90 >90 mL/min   GFR calc Af Amer >90 >90 mL/min    Comment: (NOTE) The eGFR has been calculated using the  CKD EPI equation. This calculation has not been validated in all clinical situations. eGFR's persistently <90 mL/min signify possible Chronic Kidney Disease.    Anion gap 8 5 - 15     No results found.   Assessment/Plan   ICD-9-CM ICD-10-CM   1. EMPHYSEMA, SEVERE - stable 492.8 J43.8   2. Chronic respiratory failure with hypoxia (HCC) - stable 518.83 J96.11    799.02    3. Protein-calorie malnutrition, severe ([place]) - stable 262 E43   4. Adult failure to thrive - stable 783.7 R62.7   5. Generalized weakness - stable 780.79 R53.1   6. Seasonal allergic rhinitis due to pollen -stable 477.0 J30.1     --Patient is being discharged with home health services: he will resume previous services  --Patient is being discharged with the following durable medical equipment: none    --Patient has been advised to f/u with their PCP in 1-2 weeks to bring them up to date on their rehab stay.  They were provided with a 30 day supply of scripts for prescription medications and refills must be obtained from their PCP.  TIME SPENT:  [place] [facility]  [facility] [address] [place], [place] [address] [phone] Cell (Monday-Friday 8 AM - 5 PM) [phone] After 5 PM and follow prompts

## 2015-04-02 DIAGNOSIS — J449 Chronic obstructive pulmonary disease, unspecified: Secondary | ICD-10-CM | POA: Diagnosis not present

## 2015-04-03 DIAGNOSIS — J449 Chronic obstructive pulmonary disease, unspecified: Secondary | ICD-10-CM | POA: Diagnosis not present

## 2015-04-04 ENCOUNTER — Non-Acute Institutional Stay (SKILLED_NURSING_FACILITY): Payer: Medicare Other | Admitting: Adult Health

## 2015-04-04 DIAGNOSIS — J441 Chronic obstructive pulmonary disease with (acute) exacerbation: Secondary | ICD-10-CM

## 2015-04-04 DIAGNOSIS — R911 Solitary pulmonary nodule: Secondary | ICD-10-CM | POA: Diagnosis not present

## 2015-04-04 DIAGNOSIS — K5900 Constipation, unspecified: Secondary | ICD-10-CM

## 2015-04-04 DIAGNOSIS — D571 Sickle-cell disease without crisis: Secondary | ICD-10-CM

## 2015-04-04 DIAGNOSIS — J449 Chronic obstructive pulmonary disease, unspecified: Secondary | ICD-10-CM | POA: Diagnosis not present

## 2015-04-04 DIAGNOSIS — R627 Adult failure to thrive: Secondary | ICD-10-CM

## 2015-04-04 DIAGNOSIS — K5909 Other constipation: Secondary | ICD-10-CM

## 2015-04-05 DIAGNOSIS — J449 Chronic obstructive pulmonary disease, unspecified: Secondary | ICD-10-CM | POA: Diagnosis not present

## 2015-04-06 DIAGNOSIS — J449 Chronic obstructive pulmonary disease, unspecified: Secondary | ICD-10-CM | POA: Diagnosis not present

## 2015-04-07 DIAGNOSIS — J449 Chronic obstructive pulmonary disease, unspecified: Secondary | ICD-10-CM | POA: Diagnosis not present

## 2015-04-08 DIAGNOSIS — J449 Chronic obstructive pulmonary disease, unspecified: Secondary | ICD-10-CM | POA: Diagnosis not present

## 2015-04-09 DIAGNOSIS — J449 Chronic obstructive pulmonary disease, unspecified: Secondary | ICD-10-CM | POA: Diagnosis not present

## 2015-04-10 DIAGNOSIS — J449 Chronic obstructive pulmonary disease, unspecified: Secondary | ICD-10-CM | POA: Diagnosis not present

## 2015-04-11 DIAGNOSIS — J449 Chronic obstructive pulmonary disease, unspecified: Secondary | ICD-10-CM | POA: Diagnosis not present

## 2015-04-12 DIAGNOSIS — J449 Chronic obstructive pulmonary disease, unspecified: Secondary | ICD-10-CM | POA: Diagnosis not present

## 2015-04-13 DIAGNOSIS — J449 Chronic obstructive pulmonary disease, unspecified: Secondary | ICD-10-CM | POA: Diagnosis not present

## 2015-04-14 DIAGNOSIS — J449 Chronic obstructive pulmonary disease, unspecified: Secondary | ICD-10-CM | POA: Diagnosis not present

## 2015-04-15 DIAGNOSIS — J449 Chronic obstructive pulmonary disease, unspecified: Secondary | ICD-10-CM | POA: Diagnosis not present

## 2015-04-16 DIAGNOSIS — J449 Chronic obstructive pulmonary disease, unspecified: Secondary | ICD-10-CM | POA: Diagnosis not present

## 2015-04-17 DIAGNOSIS — J449 Chronic obstructive pulmonary disease, unspecified: Secondary | ICD-10-CM | POA: Diagnosis not present

## 2015-04-18 DIAGNOSIS — J449 Chronic obstructive pulmonary disease, unspecified: Secondary | ICD-10-CM | POA: Diagnosis not present

## 2015-04-19 DIAGNOSIS — M6281 Muscle weakness (generalized): Secondary | ICD-10-CM | POA: Diagnosis not present

## 2015-04-19 DIAGNOSIS — J449 Chronic obstructive pulmonary disease, unspecified: Secondary | ICD-10-CM | POA: Diagnosis not present

## 2015-04-20 DIAGNOSIS — J449 Chronic obstructive pulmonary disease, unspecified: Secondary | ICD-10-CM | POA: Diagnosis not present

## 2015-04-21 DIAGNOSIS — K5909 Other constipation: Secondary | ICD-10-CM | POA: Insufficient documentation

## 2015-04-21 DIAGNOSIS — J449 Chronic obstructive pulmonary disease, unspecified: Secondary | ICD-10-CM | POA: Diagnosis not present

## 2015-04-21 NOTE — Progress Notes (Signed)
Patient ID: Anthony [name], male   DOB: [birth year], 79 y.o.   MRN: [id number]  [name] living [place]     No Known Allergies     Chief Complaint  Patient presents with  . Hospitalization Follow-up    HPI:  He has been hospitalized for an excerebation  Of his copd with acute on chronic respiratory failure. He is followed by hospice care. His goal is to return back home. I'm not certain is this will be possible at this time. There are no nursing concerns at this time.    Past Medical History  Diagnosis Date  . COPD (chronic obstructive pulmonary disease)   . Sickle cell anemia   . Pulmonary embolism     Past Surgical History  Procedure Laterality Date  . No past surgeries    . Colonoscopy N/A 03/09/2013    Procedure: COLONOSCOPY;  Surgeon: Beryle Beams, MD;  Location: [place];  Service: Endoscopy;  Laterality: N/A;  . Esophagogastroduodenoscopy N/A 03/09/2013    Procedure: ESOPHAGOGASTRODUODENOSCOPY (EGD);  Surgeon: Beryle Beams, MD;  Location: [facility] ENDOSCOPY;  Service: Endoscopy;  Laterality: N/A;    VITAL SIGNS BP 118/64 mmHg  Pulse 68  Ht 6\' 2"  (1.88 m)  Wt 110 lb (49.896 kg)  BMI 14.12 kg/m2  SpO2 94%   Outpatient Encounter Prescriptions as of 02/21/2015  Medication Sig  . albuterol (PROVENTIL HFA;VENTOLIN HFA) 108 (90 BASE) MCG/ACT inhaler Inhale 2 puffs into the lungs every 6 (six) hours as needed for wheezing or shortness of breath (wheezing and sob).  Marland Kitchen albuterol (PROVENTIL) (2.5 MG/3ML) 0.083% nebulizer solution Take 3 mLs (2.5 mg total) by nebulization every 4 (four) hours as needed for wheezing or shortness of breath.  . bisacodyl (DULCOLAX) 10 MG suppository Place 1 suppository (10 mg total) rectally daily as needed for moderate constipation.  . budesonide-formoterol (SYMBICORT) 160-4.5 MCG/ACT inhaler Inhale 2 puffs into the lungs 2 (two) times daily.  . cetirizine (ZYRTEC) 10 MG tablet Take 10 mg by mouth daily.  . Cholecalciferol (VITAMIN D)  2000 UNITS tablet Take 2,000 Units by mouth daily.  Marland Kitchen docusate sodium 100 MG CAPS Take 100 mg by mouth 2 (two) times daily.  Marland Kitchen ENSURE (ENSURE) Take 237 mLs by mouth 4 (four) times daily -  with meals and at bedtime.  . ferrous sulfate 325 (65 FE) MG tablet Take 325 mg by mouth 2 (two) times daily with a meal.  . fluticasone (FLONASE) 50 MCG/ACT nasal spray Place 2 sprays into the nose daily. (Patient not taking: Reported on 02/07/2015)  . folic acid (FOLVITE) 099 MCG tablet Take 400 mcg by mouth daily.  . furosemide (LASIX) 20 MG tablet Take 20 mg by mouth daily.  . Ipratropium-Albuterol (COMBIVENT RESPIMAT IN) Inhale 2 puffs into the lungs 3 (three) times daily.  Marland Kitchen ipratropium-albuterol (DUONEB) 0.5-2.5 (3) MG/3ML SOLN Take 3 mLs by nebulization 4 (four) times daily -  before meals and at bedtime.  Marland Kitchen levofloxacin (LEVAQUIN) 750 MG tablet Take 1 tablet (750 mg total) by mouth every m-w-f  . LORazepam (ATIVAN) 0.5 MG tablet Take 0.5 mg by mouth every 6 (six) hours as needed for anxiety.  . magnesium citrate SOLN Take 1 Bottle by mouth once.  . megestrol (MEGACE) 400 MG/10ML suspension Take 10 mLs (400 mg total) by mouth daily. (Patient not taking: Reported on 02/07/2015)  . morphine (MSIR) 15 MG tablet Take 1 tablet (15 mg total) by mouth every 4 (four) hours as needed for moderate pain or  severe pain (or shortness of breath).  . pantoprazole (PROTONIX) 40 MG tablet Take 1 tablet (40 mg total) by mouth daily at 12 noon.  . polyethylene glycol (MIRALAX / GLYCOLAX) packet Take 17 g by mouth 2 (two) times daily.  . potassium chloride (MICRO-K) 10 MEQ CR capsule Take 10 mEq by mouth daily.   . predniSONE (DELTASONE) 20 MG tablet Take 2 tablets (40 mg total) by mouth daily with breakfast.  . senna (SENOKOT) 8.6 MG TABS tablet Take 2 tablets (17.2 mg total) by mouth at bedtime. (Patient taking differently: Take 2 tablets by mouth 2 (two) times daily. )  . sodium chloride (OCEAN) 0.65 % SOLN nasal spray Place  1 spray into both nostrils as needed for congestion. (Patient not taking: Reported on 02/07/2015)  . sodium phosphate (FLEET) enema Place 1 enema rectally daily as needed (constipation). follow package directions  . Vitamin D, Ergocalciferol, (DRISDOL) 50000 UNITS CAPS Take 50,000 Units by mouth every 7 (seven) days. On mondays      SIGNIFICANT DIAGNOSTIC EXAMS  02-07-15: chest x-ray: No acute cardiopulmonary findings. No evidence pneumonia. Hyperinflated lungs with right basilar scarring and atelectasis.  02-08-15: ct angio of chest: No evidence of pulmonary embolism.No acute cardiopulmonary disease. Minimal posterior bibasilar scarring/ atelectasis. Severe emphysematous disease. 1.4 cm spiculated nodule over the left lower lobe suspicious for primary malignancy.   02-08-15: kub: Nonobstructive bowel gas pattern.   02-10-15: ct of head: 1. Stable atrophy and white matter disease. 2. No acute intracranial abnormality.     LABS REVIEWED:   02-07-15: wbc 7.2; hgb 9.0; hct 26.6; mcv 90.2; plt 168; glucose 142; bun 15; creat 1.43; k+3.3; na++140; C02: 41 02-08-15: wbc 4.3; hgb 9.0; hct 26.1; mcv 90.6; plt 114; glucose 142; bun 14; creat 1.34; k+4.8; na++142; C02: 41; liver normal albumin 3.5; mag 2.3; phos 46; tsh 0.251; free t4: 0.98 02-14-15: glucose 94; bun 19; creat 0.68; k+3.9; na++144; C02: 40          Review of Systems  Constitutional: Negative for malaise/fatigue.  Respiratory: Negative for cough, sputum production and shortness of breath.   Cardiovascular: Negative for chest pain and leg swelling.  Gastrointestinal: Negative for heartburn and constipation.  Musculoskeletal: Negative for myalgias and joint pain.  Skin: Negative.   Psychiatric/Behavioral: The patient is not nervous/anxious.      Physical Exam  Constitutional: No distress.  Frail   Neck: Neck supple. No JVD present. No thyromegaly present.  Cardiovascular: Normal rate, regular rhythm and intact distal pulses.     Respiratory: Effort normal. No respiratory distress.  Breath sounds diminished   GI: Soft. Bowel sounds are normal. He exhibits no distension.  Musculoskeletal: Normal range of motion. He exhibits no edema.  Neurological: He is alert.  Skin: Skin is warm and dry. He is not diaphoretic.       ASSESSMENT/ PLAN:  1. COPD: he is without change at this time; is followed by hospice care. Is 02 dependent; will continue albuterol neb every 4 hours as needed; albuterol 1 puff every 2 hours as needed;  prednisone 40 mg daily zyrtec 10 mg daily for allergies and flonase daily for allergies; will continue  combivent respimat 2 puffs three times daily; duoneb three times daily will continue levaquin 750 mg three times weekly   2. siclle cell disease: no recent flares will continue prednisone 40 mg daily is on iron twice daily his hgb is 9.0  3. Lung nodule: has long term copd which is end stage  is followed by hospice care; no further workup is indicated at this time.   4. Edema; will continue lasix 20 mg daily with k+ 10 meq daily   5. GERD: will continue protonix 40 mg daily   6. Anxiety: will continue ativan 0.5 mg every 6 hours as needed; and will monitor  7. Chronic pain: his pain is currently being managed with roxanol 10 mg every 2 hours as needed for distress due to his copd and will continue morphine IR 30 mg every 3 hours as needed for pain   8. Constipation: will continue miralax daily and senna 2 tabs daily   9. FTT: will continue supplements as directed by facility protocol his current weight is 110 pounds will monitor    Time spent with patient 50 minutes.    Ok Edwards NP [facility]  Contact [phone] Monday through Friday 8am- 5pm  After hours call [phone]

## 2015-04-22 DIAGNOSIS — J449 Chronic obstructive pulmonary disease, unspecified: Secondary | ICD-10-CM | POA: Diagnosis not present

## 2015-04-23 DIAGNOSIS — J449 Chronic obstructive pulmonary disease, unspecified: Secondary | ICD-10-CM | POA: Diagnosis not present

## 2015-04-24 DIAGNOSIS — J449 Chronic obstructive pulmonary disease, unspecified: Secondary | ICD-10-CM | POA: Diagnosis not present

## 2015-04-25 DIAGNOSIS — J449 Chronic obstructive pulmonary disease, unspecified: Secondary | ICD-10-CM | POA: Diagnosis not present

## 2015-04-26 DIAGNOSIS — J449 Chronic obstructive pulmonary disease, unspecified: Secondary | ICD-10-CM | POA: Diagnosis not present

## 2015-04-27 DIAGNOSIS — J449 Chronic obstructive pulmonary disease, unspecified: Secondary | ICD-10-CM | POA: Diagnosis not present

## 2015-04-28 DIAGNOSIS — J449 Chronic obstructive pulmonary disease, unspecified: Secondary | ICD-10-CM | POA: Diagnosis not present

## 2015-04-29 DIAGNOSIS — J449 Chronic obstructive pulmonary disease, unspecified: Secondary | ICD-10-CM | POA: Diagnosis not present

## 2015-04-30 DIAGNOSIS — J449 Chronic obstructive pulmonary disease, unspecified: Secondary | ICD-10-CM | POA: Diagnosis not present

## 2015-05-01 DIAGNOSIS — J449 Chronic obstructive pulmonary disease, unspecified: Secondary | ICD-10-CM | POA: Diagnosis not present

## 2015-05-02 DIAGNOSIS — J449 Chronic obstructive pulmonary disease, unspecified: Secondary | ICD-10-CM | POA: Diagnosis not present

## 2015-05-03 DIAGNOSIS — J449 Chronic obstructive pulmonary disease, unspecified: Secondary | ICD-10-CM | POA: Diagnosis not present

## 2015-05-04 DIAGNOSIS — J449 Chronic obstructive pulmonary disease, unspecified: Secondary | ICD-10-CM | POA: Diagnosis not present

## 2015-05-04 NOTE — Progress Notes (Signed)
Patient ID: Anthony [name], male   DOB: 04-08-1936, 79 y.o.   MRN: [id number]  [name] living [place]     No Known Allergies     Chief Complaint  Patient presents with  . Discharge Note    HPI:  He is being discharged to home with hospice care. He will not need dme. Any dme will be obtained through hospice services. He will follow up with his pcp. He will need his prescriptions to be written,    Past Medical History  Diagnosis Date  . COPD (chronic obstructive pulmonary disease)   . Sickle cell anemia   . Pulmonary embolism     Past Surgical History  Procedure Laterality Date  . No past surgeries    . Colonoscopy N/A 03/09/2013    Procedure: COLONOSCOPY;  Surgeon: Beryle Beams, MD;  Location: [place];  Service: Endoscopy;  Laterality: N/A;  . Esophagogastroduodenoscopy N/A 03/09/2013    Procedure: ESOPHAGOGASTRODUODENOSCOPY (EGD);  Surgeon: Beryle Beams, MD;  Location: [facility] ENDOSCOPY;  Service: Endoscopy;  Laterality: N/A;    VITAL SIGNS BP 150/76 mmHg  Pulse 70  Ht 6\' 2"  (1.88 m)  Wt 110 lb (49.896 kg)  BMI 14.12 kg/m2  SpO2 96%   Outpatient Encounter Prescriptions as of 03/14/2015  Medication Sig  . albuterol (PROVENTIL HFA;VENTOLIN HFA) 108 (90 BASE) MCG/ACT inhaler Inhale 2 puffs into the lungs every 6 (six) hours as needed for wheezing or shortness of breath (wheezing and sob).  Marland Kitchen albuterol (PROVENTIL) (2.5 MG/3ML) 0.083% nebulizer solution Take 3 mLs (2.5 mg total) by nebulization every 4 (four) hours as needed for wheezing or shortness of breath.  . bisacodyl (DULCOLAX) 10 MG suppository Place 1 suppository (10 mg total) rectally daily as needed for moderate constipation.  . budesonide-formoterol (SYMBICORT) 160-4.5 MCG/ACT inhaler Inhale 2 puffs into the lungs 2 (two) times daily.  . cetirizine (ZYRTEC) 10 MG tablet Take 10 mg by mouth daily.  . Cholecalciferol (VITAMIN D) 2000 UNITS tablet Take 2,000 Units by mouth daily.  Marland Kitchen docusate sodium 100 MG  CAPS Take 100 mg by mouth 2 (two) times daily.  Marland Kitchen ENSURE (ENSURE) Take 237 mLs by mouth 4 (four) times daily -  with meals and at bedtime.  . ferrous sulfate 325 (65 FE) MG tablet Take 325 mg by mouth 2 (two) times daily with a meal.  . folic acid (FOLVITE) 505 MCG tablet Take 400 mcg by mouth daily.  . furosemide (LASIX) 20 MG tablet Take 20 mg by mouth daily.  . Ipratropium-Albuterol (COMBIVENT RESPIMAT IN) Inhale 2 puffs into the lungs 3 (three) times daily.  Marland Kitchen ipratropium-albuterol (DUONEB) 0.5-2.5 (3) MG/3ML SOLN Take 3 mLs by nebulization 4 (four) times daily -  before meals and at bedtime.  Marland Kitchen levofloxacin (LEVAQUIN) 750 MG tablet Take 1 tablet (750 mg total) by mouth every other day.  Marland Kitchen LORazepam (ATIVAN) 0.5 MG tablet Take 0.5 mg by mouth every 6 (six) hours as needed for anxiety.  . magnesium citrate SOLN Take 1 Bottle by mouth once.  . morphine (MSIR) 15 MG tablet Take 1 tablet (15 mg total) by mouth every 4 (four) hours as needed for moderate pain or severe pain (or shortness of breath).  . pantoprazole (PROTONIX) 40 MG tablet Take 1 tablet (40 mg total) by mouth daily at 12 noon.  . polyethylene glycol (MIRALAX / GLYCOLAX) packet Take 17 g by mouth 2 (two) times daily.  . potassium chloride (MICRO-K) 10 MEQ CR capsule Take 10 mEq  by mouth daily.   . predniSONE (DELTASONE) 20 MG tablet Take 2 tablets (40 mg total) by mouth daily with breakfast.  . senna (SENOKOT) 8.6 MG TABS tablet Take  2 tablets by mouth 2 (two) times daily. )  . sodium phosphate (FLEET) enema Place 1 enema rectally daily as needed (constipation). follow package directions  . Vitamin D, Ergocalciferol, (DRISDOL) 50000 UNITS CAPS Take 50,000 Units by mouth every 7 (seven) days. On mondays      SIGNIFICANT DIAGNOSTIC EXAMS  02-07-15: chest x-ray: No acute cardiopulmonary findings. No evidence pneumonia. Hyperinflated lungs with right basilar scarring and atelectasis.  02-08-15: ct angio of chest: No evidence of  pulmonary embolism.No acute cardiopulmonary disease. Minimal posterior bibasilar scarring/ atelectasis. Severe emphysematous disease. 1.4 cm spiculated nodule over the left lower lobe suspicious for primary malignancy.   02-08-15: kub: Nonobstructive bowel gas pattern.   02-10-15: ct of head: 1. Stable atrophy and white matter disease. 2. No acute intracranial abnormality.     LABS REVIEWED:   02-07-15: wbc 7.2; hgb 9.0; hct 26.6; mcv 90.2; plt 168; glucose 142; bun 15; creat 1.43; k+3.3; na++140; C02: 41 02-08-15: wbc 4.3; hgb 9.0; hct 26.1; mcv 90.6; plt 114; glucose 142; bun 14; creat 1.34; k+4.8; na++142; C02: 41; liver normal albumin 3.5; mag 2.3; phos 46; tsh 0.251; free t4: 0.98 02-14-15: glucose 94; bun 19; creat 0.68; k+3.9; na++144; C02: 40       ROS Constitutional: Negative for malaise/fatigue.  Respiratory: Negative for cough, sputum production and shortness of breath.   Cardiovascular: Negative for chest pain and leg swelling.  Gastrointestinal: Negative for heartburn and constipation.  Musculoskeletal: Negative for myalgias and joint pain.  Skin: Negative.   Psychiatric/Behavioral: The patient is not nervous/anxious.      Physical Exam Constitutional: No distress.  Frail   Neck: Neck supple. No JVD present. No thyromegaly present.  Cardiovascular: Normal rate, regular rhythm and intact distal pulses.   Respiratory: Effort normal. No respiratory distress.  Breath sounds diminished   GI: Soft. Bowel sounds are normal. He exhibits no distension.  Musculoskeletal: Normal range of motion. He exhibits no edema.  Neurological: He is alert.  Skin: Skin is warm and dry. He is not diaphoretic.     ASSESSMENT/ PLAN:  Will discharge him to home with [facility]. He will not need dme. He will follow up with Dr. Lorretta Harp on 03-27-15 at 2:45 pm. His prescriptions have been written for a 30 day supply of medications with roxanol 30 cc; ativan 0.5 mg #30 tabs; MSO4 15 mg tabs  #60 tabs.   Time spent with patient 40 minutes.    Ok Edwards NP [facility]  Contact [phone] Monday through Friday 8am- 5pm  After hours call [phone]

## 2015-05-05 ENCOUNTER — Encounter: Payer: Self-pay | Admitting: Adult Health

## 2015-05-05 DIAGNOSIS — J449 Chronic obstructive pulmonary disease, unspecified: Secondary | ICD-10-CM | POA: Diagnosis not present

## 2015-05-05 NOTE — Progress Notes (Signed)
Patient ID: Anthony [name], male   DOB: [birth year], 79 y.o.   MRN: [id number]  Anthony [name] living [place]      No Known Allergies     Chief Complaint  Patient presents with  . Medical Management of Chronic Issues    HPI:  He is a long term resident of this facility being seen for the management of his chronic illnesses. He is followed by hospice care. He has not been discharged at this time. He is not voicing any concerns or complaints at this time there are no nursing concerns at this time.    Past Medical History  Diagnosis Date  . COPD (chronic obstructive pulmonary disease)   . Sickle cell anemia   . Pulmonary embolism     Past Surgical History  Procedure Laterality Date  . No past surgeries    . Colonoscopy N/A 03/09/2013    Procedure: COLONOSCOPY;  Surgeon: Beryle Beams, MD;  Location: [place];  Service: Endoscopy;  Laterality: N/A;  . Esophagogastroduodenoscopy N/A 03/09/2013    Procedure: ESOPHAGOGASTRODUODENOSCOPY (EGD);  Surgeon: Beryle Beams, MD;  Location: [facility] ENDOSCOPY;  Service: Endoscopy;  Laterality: N/A;    VITAL SIGNS BP 122/60 mmHg  Pulse 90  Ht 6\' 2"  (1.88 m)  Wt 117 lb (53.071 kg)  BMI 15.02 kg/m2  SpO2 94%   Outpatient Encounter Prescriptions as of 04/04/2015  Medication Sig  . albuterol (PROVENTIL HFA;VENTOLIN HFA) 108 (90 BASE) MCG/ACT inhaler Inhale 2 puffs into the lungs every 6 (six) hours as needed for wheezing or shortness of breath (wheezing and sob).  Marland Kitchen albuterol (PROVENTIL) (2.5 MG/3ML) 0.083% nebulizer solution Take 3 mLs (2.5 mg total) by nebulization every 4 (four) hours as needed for wheezing or shortness of breath.  . bisacodyl (DULCOLAX) 10 MG suppository Place 1 suppository (10 mg total) rectally daily as needed for moderate constipation.  . docusate sodium 100 MG CAPS Take 100 mg by mouth 2 (two) times daily.  Marland Kitchen ENSURE (ENSURE) Take 237 mLs by mouth 4 (four) times daily -  with meals and at bedtime.  . ferrous sulfate  325 (65 FE) MG tablet Take 325 mg by mouth daily with breakfast.   . fluticasone (FLONASE) 50 MCG/ACT nasal spray Place 2 sprays into the nose daily.  . folic acid (FOLVITE) 101 MCG tablet Take 400 mcg by mouth daily.  . furosemide (LASIX) 20 MG tablet Take 20 mg by mouth daily.  . Ipratropium-Albuterol (COMBIVENT RESPIMAT IN) Inhale 2 puffs into the lungs 3 (three) times daily.  Marland Kitchen ipratropium-albuterol (DUONEB) 0.5-2.5 (3) MG/3ML SOLN Take 3 mLs by nebulization 4 (four) times daily -  before meals and at bedtime.  Marland Kitchen levofloxacin (LEVAQUIN) 750 MG tablet Take 1 tablet (750 mg total) by mouth every other day.  Marland Kitchen LORazepam (ATIVAN) 0.5 MG tablet Take 0.5 mg by mouth every 6 (six) hours as needed for anxiety.  Marland Kitchen morphine (MSIR) 15 MG tablet  Take 30 mg by mouth every 3 (three) hours as needed for moderate pain or severe pain (or shortness of breath). )  . morphine (MSIR) 30 MG tablet Take one tablet by mouth at bedtime for pain  . pantoprazole (PROTONIX) 40 MG tablet Take 1 tablet (40 mg total) by mouth daily at 12 noon.  . polyethylene glycol (MIRALAX / GLYCOLAX) packet  Take 17 g by mouth daily. )  . potassium chloride (MICRO-K) 10 MEQ CR capsule Take 10 mEq by mouth daily.   . predniSONE (DELTASONE) 20 MG tablet  Take 2 tablets (40 mg total) by mouth daily with breakfast.  . senna (SENOKOT) 8.6 MG TABS tablet Take 2 tablets (17.2 mg total) by mouth at bedtime.  . sodium phosphate (FLEET) enema Place 1 enema rectally daily as needed (constipation). follow package directions      SIGNIFICANT DIAGNOSTIC EXAMS   02-07-15: chest x-ray: No acute cardiopulmonary findings. No evidence pneumonia. Hyperinflated lungs with right basilar scarring and atelectasis.  02-08-15: ct angio of chest: No evidence of pulmonary embolism.No acute cardiopulmonary disease. Minimal posterior bibasilar scarring/ atelectasis. Severe emphysematous disease. 1.4 cm spiculated nodule over the left lower lobe suspicious for  primary malignancy.   02-08-15: kub: Nonobstructive bowel gas pattern.   02-10-15: ct of head: 1. Stable atrophy and white matter disease. 2. No acute intracranial abnormality.     LABS REVIEWED:   02-07-15: wbc 7.2; hgb 9.0; hct 26.6; mcv 90.2; plt 168; glucose 142; bun 15; creat 1.43; k+3.3; na++140; C02: 41 02-08-15: wbc 4.3; hgb 9.0; hct 26.1; mcv 90.6; plt 114; glucose 142; bun 14; creat 1.34; k+4.8; na++142; C02: 41; liver normal albumin 3.5; mag 2.3; phos 46; tsh 0.251; free t4: 0.98 02-14-15: glucose 94; bun 19; creat 0.68; k+3.9; na++144; C02: 40        ROS Constitutional: Negative for malaise/fatigue.  Respiratory: Negative for cough, sputum production and shortness of breath.   Cardiovascular: Negative for chest pain and leg swelling.  Gastrointestinal: Negative for heartburn and constipation.  Musculoskeletal: Negative for myalgias and joint pain.  Skin: Negative.   Psychiatric/Behavioral: The patient is not nervous/anxious.       Physical Exam Constitutional: No distress.  Frail   Neck: Neck supple. No JVD present. No thyromegaly present.  Cardiovascular: Normal rate, regular rhythm and intact distal pulses.   Respiratory: Effort normal. No respiratory distress.  Breath sounds diminished   GI: Soft. Bowel sounds are normal. He exhibits no distension.  Musculoskeletal: Normal range of motion. He exhibits no edema.  Neurological: He is alert.  Skin: Skin is warm and dry. He is not diaphoretic.       ASSESSMENT/ PLAN:  1. COPD: he is without change at this time; is followed by hospice care. Is 02 dependent; will continue albuterol neb every 4 hours as needed; albuterol 1 puff every 2 hours as needed;  prednisone 40 mg daily; will continue  combivent respimat 2 puffs three times daily; duoneb three times daily will continue levaquin 750 mg three times weekly   2. siclle cell disease: no recent flares will continue prednisone 40 mg daily is on iron twice daily his  hgb is 9.0  3. Lung nodule: has long term copd which is end stage is followed by hospice care; no further workup is indicated at this time.   4. Edema; will continue lasix 20 mg daily with k+ 10 meq daily   5. GERD: will continue protonix 40 mg daily   6. Anxiety: will continue ativan 0.5 mg every 6 hours as needed; and will monitor  7. Chronic pain: his pain is currently being managed with roxanol 10 mg every 2 hours as needed for distress due to his copd and will continue morphine IR 30 mg every 3 hours as needed for pain   8. Constipation: will continue miralax daily and senna 2 tabs daily   9. FTT: will continue supplements as directed by facility protocol his current weight is 110 pounds will monitor    Ok Edwards NP [facility]  Contact [phone] Monday  through Friday 8am- 5pm  After hours call [phone]

## 2015-05-06 DIAGNOSIS — J449 Chronic obstructive pulmonary disease, unspecified: Secondary | ICD-10-CM | POA: Diagnosis not present

## 2015-05-07 DIAGNOSIS — J449 Chronic obstructive pulmonary disease, unspecified: Secondary | ICD-10-CM | POA: Diagnosis not present

## 2015-05-08 DIAGNOSIS — J449 Chronic obstructive pulmonary disease, unspecified: Secondary | ICD-10-CM | POA: Diagnosis not present

## 2015-05-09 ENCOUNTER — Non-Acute Institutional Stay (SKILLED_NURSING_FACILITY): Payer: Medicare Other | Admitting: Adult Health

## 2015-05-09 DIAGNOSIS — K5900 Constipation, unspecified: Secondary | ICD-10-CM | POA: Diagnosis not present

## 2015-05-09 DIAGNOSIS — J441 Chronic obstructive pulmonary disease with (acute) exacerbation: Secondary | ICD-10-CM | POA: Diagnosis not present

## 2015-05-09 DIAGNOSIS — R911 Solitary pulmonary nodule: Secondary | ICD-10-CM

## 2015-05-09 DIAGNOSIS — R627 Adult failure to thrive: Secondary | ICD-10-CM | POA: Diagnosis not present

## 2015-05-09 DIAGNOSIS — D571 Sickle-cell disease without crisis: Secondary | ICD-10-CM

## 2015-05-09 DIAGNOSIS — J449 Chronic obstructive pulmonary disease, unspecified: Secondary | ICD-10-CM | POA: Diagnosis not present

## 2015-05-09 DIAGNOSIS — K5909 Other constipation: Secondary | ICD-10-CM

## 2015-05-10 DIAGNOSIS — J449 Chronic obstructive pulmonary disease, unspecified: Secondary | ICD-10-CM | POA: Diagnosis not present

## 2015-05-11 DIAGNOSIS — J449 Chronic obstructive pulmonary disease, unspecified: Secondary | ICD-10-CM | POA: Diagnosis not present

## 2015-05-12 ENCOUNTER — Non-Acute Institutional Stay (SKILLED_NURSING_FACILITY): Payer: Medicare Other | Admitting: Adult Health

## 2015-05-12 ENCOUNTER — Encounter: Payer: Self-pay | Admitting: Adult Health

## 2015-05-12 DIAGNOSIS — J441 Chronic obstructive pulmonary disease with (acute) exacerbation: Secondary | ICD-10-CM

## 2015-05-12 DIAGNOSIS — D571 Sickle-cell disease without crisis: Secondary | ICD-10-CM

## 2015-05-12 DIAGNOSIS — R627 Adult failure to thrive: Secondary | ICD-10-CM | POA: Diagnosis not present

## 2015-05-12 DIAGNOSIS — R911 Solitary pulmonary nodule: Secondary | ICD-10-CM | POA: Diagnosis not present

## 2015-05-12 DIAGNOSIS — J449 Chronic obstructive pulmonary disease, unspecified: Secondary | ICD-10-CM | POA: Diagnosis not present

## 2015-05-12 NOTE — Progress Notes (Signed)
Patient ID: Anthony [name], male   DOB: 12-08-1935, 79 y.o.   MRN: [id number]  Anthony [name] living [place]     No Known Allergies     Chief Complaint  Patient presents with  . Medical Management of Chronic Issues    HPI:  He is a long term resident of this facility being seen for the management of his chronic illnesses. Overall there is little change in his status. He continues to be followed by hospice care. He is not voicing any complaints or concerns today. There are no nursing concerns today.    Past Medical History  Diagnosis Date  . COPD (chronic obstructive pulmonary disease)   . Sickle cell anemia   . Pulmonary embolism     Past Surgical History  Procedure Laterality Date  . No past surgeries    . Colonoscopy N/A 03/09/2013    Procedure: COLONOSCOPY;  Surgeon: Beryle Beams, MD;  Location: [facility];  Service: Endoscopy;  Laterality: N/A;  . Esophagogastroduodenoscopy N/A 03/09/2013    Procedure: ESOPHAGOGASTRODUODENOSCOPY (EGD);  Surgeon: Beryle Beams, MD;  Location: [facility] ENDOSCOPY;  Service: Endoscopy;  Laterality: N/A;    VITAL SIGNS BP 122/60 mmHg  Pulse 90  Ht 6\' 2"  (1.88 m)  Wt 117 lb (53.071 kg)  BMI 15.02 kg/m2  SpO2 93%   Outpatient Encounter Prescriptions as of 05/09/2015  Medication Sig  . albuterol (PROVENTIL HFA;VENTOLIN HFA) 108 (90 BASE) MCG/ACT inhaler Inhale 2 puffs into the lungs every 6 (six) hours as needed for wheezing or shortness of breath (wheezing and sob).  Marland Kitchen albuterol (PROVENTIL) (2.5 MG/3ML) 0.083% nebulizer solution Take 3 mLs (2.5 mg total) by nebulization every 4 (four) hours as needed for wheezing or shortness of breath.  . bisacodyl (DULCOLAX) 10 MG suppository Place 1 suppository (10 mg total) rectally daily as needed for moderate constipation.  . docusate sodium 100 MG CAPS Take 100 mg by mouth 2 (two) times daily.  Marland Kitchen ENSURE (ENSURE) Take 237 mLs by mouth 4 (four) times daily -  with meals and at bedtime.  . ferrous sulfate  325 (65 FE) MG tablet Take 325 mg by mouth daily with breakfast.   . fluticasone (FLONASE) 50 MCG/ACT nasal spray Place 2 sprays into the nose daily.  . folic acid (FOLVITE) 841 MCG tablet Take 400 mcg by mouth daily.  . furosemide (LASIX) 20 MG tablet Take 20 mg by mouth daily.  . Ipratropium-Albuterol (COMBIVENT RESPIMAT IN) Inhale 2 puffs into the lungs 3 (three) times daily.  Marland Kitchen ipratropium-albuterol (DUONEB) 0.5-2.5 (3) MG/3ML SOLN Take 3 mLs by nebulization 4 (four) times daily -  before meals and at bedtime.  Marland Kitchen levofloxacin (LEVAQUIN) 750 MG tablet Take 1 tablet (750 mg total) by mouth every other day.  Marland Kitchen LORazepam (ATIVAN) 0.5 MG tablet Take 0.5 mg by mouth every 6 (six) hours as needed for anxiety.  Marland Kitchen morphine (MSIR) 15 MG tablet  Take 30 mg by mouth every 3 (three) hours as needed for moderate pain or severe pain (or shortness of breath). )  . morphine (MSIR) 30 MG tablet Take one tablet by mouth at bedtime for pain  . pantoprazole (PROTONIX) 40 MG tablet Take 1 tablet (40 mg total) by mouth daily at 12 noon.  . polyethylene glycol (MIRALAX / GLYCOLAX) packet Take 17 g by mouth 2 (two) times daily. (Patient taking differently: Take 17 g by mouth daily. )  . potassium chloride (MICRO-K) 10 MEQ CR capsule Take 10 mEq by mouth daily.   Marland Kitchen  predniSONE (DELTASONE) 20 MG tablet Take 2 tablets (40 mg total) by mouth daily with breakfast.  . senna (SENOKOT) 8.6 MG TABS tablet Take 2 tablets (17.2 mg total) by mouth at bedtime.  . sodium phosphate (FLEET) enema Place 1 enema rectally daily as needed (constipation). follow package directions      SIGNIFICANT DIAGNOSTIC EXAMS  02-07-15: chest x-ray: No acute cardiopulmonary findings. No evidence pneumonia. Hyperinflated lungs with right basilar scarring and atelectasis.  02-08-15: ct angio of chest: No evidence of pulmonary embolism.No acute cardiopulmonary disease. Minimal posterior bibasilar scarring/ atelectasis. Severe emphysematous disease. 1.4  cm spiculated nodule over the left lower lobe suspicious for primary malignancy.   02-08-15: kub: Nonobstructive bowel gas pattern.   02-10-15: ct of head: 1. Stable atrophy and white matter disease. 2. No acute intracranial abnormality.     LABS REVIEWED:   02-07-15: wbc 7.2; hgb 9.0; hct 26.6; mcv 90.2; plt 168; glucose 142; bun 15; creat 1.43; k+3.3; na++140; C02: 41 02-08-15: wbc 4.3; hgb 9.0; hct 26.1; mcv 90.6; plt 114; glucose 142; bun 14; creat 1.34; k+4.8; na++142; C02: 41; liver normal albumin 3.5; mag 2.3; phos 46; tsh 0.251; free t4: 0.98 02-14-15: glucose 94; bun 19; creat 0.68; k+3.9; na++144; C02: 40      ROS Constitutional: Negative for malaise/fatigue.  Respiratory: Negative for cough, sputum production and shortness of breath.   Cardiovascular: Negative for chest pain and leg swelling.  Gastrointestinal: Negative for heartburn and constipation.  Musculoskeletal: Negative for myalgias and joint pain.  Skin: Negative.   Psychiatric/Behavioral: The patient is not nervous/anxious.      Physical Exam Constitutional: No distress.  Frail   Neck: Neck supple. No JVD present. No thyromegaly present.  Cardiovascular: Normal rate, regular rhythm and intact distal pulses.   Respiratory: Effort normal. No respiratory distress.  Breath sounds diminished   GI: Soft. Bowel sounds are normal. He exhibits no distension.  Musculoskeletal: Normal range of motion. He exhibits no edema.  Neurological: He is alert.  Skin: Skin is warm and dry. He is not diaphoretic.       ASSESSMENT/ PLAN:  1. COPD: he is without change at this time; is followed by hospice care. Is 02 dependent; will continue albuterol neb every 4 hours as needed; albuterol 1 puff every 2 hours as needed;  prednisone 40 mg daily; will continue  combivent respimat 2 puffs three times daily; duoneb three times daily will continue levaquin 750 mg three times weekly   2. siclle cell disease: no recent flares will  continue prednisone 40 mg daily is on iron twice daily his hgb is 9.0  3. Lung nodule: has long term copd which is end stage is followed by hospice care; no further workup is indicated at this time.   4. Edema; will continue lasix 20 mg daily with k+ 10 meq daily   5. GERD: will continue protonix 40 mg daily   6. Anxiety: will continue ativan 0.5 mg every 6 hours as needed; and will monitor  7. Chronic pain: his pain is currently being managed with roxanol 10 mg every 2 hours as needed for distress due to his copd and will continue morphine IR 30 mg every 3 hours as needed for pain   8. Constipation: will continue miralax daily and senna 2 tabs daily   9. FTT: will continue supplements as directed by facility protocol his current weight is 117 pounds will monitor  Will have nursing weigh patient three times weekly  Ok Edwards NP [facility]  Contact [phone] Monday through Friday 8am- 5pm  After hours call [phone]

## 2015-05-12 NOTE — Progress Notes (Signed)
Patient ID: Anthony [name], male   DOB: 22-Jul-1936, 79 y.o.   MRN: [id number]  Anthony [name] living [place]     No Known Allergies     Chief Complaint  Patient presents with  . Discharge Note    HPI:  He is being discharged to home with hospice care. He will not need dme; his 64 has been arranged. He will not need further home health services for therapy. He will need his prescriptions to be written and will need to follow up with hospice medical provider.    Past Medical History  Diagnosis Date  . COPD (chronic obstructive pulmonary disease)   . Sickle cell anemia   . Pulmonary embolism     Past Surgical History  Procedure Laterality Date  . No past surgeries    . Colonoscopy N/A 03/09/2013    Procedure: COLONOSCOPY;  Surgeon: Beryle Beams, MD;  Location: [facility];  Service: Endoscopy;  Laterality: N/A;  . Esophagogastroduodenoscopy N/A 03/09/2013    Procedure: ESOPHAGOGASTRODUODENOSCOPY (EGD);  Surgeon: Beryle Beams, MD;  Location: [facility] ENDOSCOPY;  Service: Endoscopy;  Laterality: N/A;    VITAL SIGNS BP 120/60 mmHg  Pulse 90  Ht 6\' 2"  (1.88 m)  Wt 117 lb (53.071 kg)  BMI 15.02 kg/m2  SpO2 93%   Outpatient Encounter Prescriptions as of 05/12/2015  Medication Sig  . albuterol (PROVENTIL HFA;VENTOLIN HFA) 108 (90 BASE) MCG/ACT inhaler Inhale 2 puffs into the lungs every 6 (six) hours as needed for wheezing or shortness of breath (wheezing and sob).  Marland Kitchen albuterol (PROVENTIL) (2.5 MG/3ML) 0.083% nebulizer solution Take 3 mLs (2.5 mg total) by nebulization every 4 (four) hours as needed for wheezing or shortness of breath.  . bisacodyl (DULCOLAX) 10 MG suppository Place 1 suppository (10 mg total) rectally daily as needed for moderate constipation.  . docusate sodium 100 MG CAPS Take 100 mg by mouth 2 (two) times daily.  Marland Kitchen ENSURE (ENSURE) Take 237 mLs by mouth 4 (four) times daily -  with meals and at bedtime.  . ferrous sulfate 325 (65 FE) MG tablet Take 325 mg by  mouth daily with breakfast.   . fluticasone (FLONASE) 50 MCG/ACT nasal spray Place 2 sprays into the nose daily.  . folic acid (FOLVITE) 735 MCG tablet Take 400 mcg by mouth daily.  . furosemide (LASIX) 20 MG tablet Take 20 mg by mouth daily.  . Ipratropium-Albuterol (COMBIVENT RESPIMAT IN) Inhale 2 puffs into the lungs 3 (three) times daily.  Marland Kitchen ipratropium-albuterol (DUONEB) 0.5-2.5 (3) MG/3ML SOLN Take 3 mLs by nebulization 4 (four) times daily -  before meals and at bedtime.  Marland Kitchen levofloxacin (LEVAQUIN) 750 MG tablet Take 1 tablet (750 mg total) by mouth every other day.  Marland Kitchen LORazepam (ATIVAN) 0.5 MG tablet Take 0.5 mg by mouth every 6 (six) hours as needed for anxiety.  Marland Kitchen morphine (MSIR) 15 MG tablet  Take 30 mg by mouth every 3 (three) hours as needed for moderate pain or severe pain (or shortness of breath). )  . morphine (MSIR) 30 MG tablet Take one tablet by mouth at bedtime for pain  . pantoprazole (PROTONIX) 40 MG tablet Take 1 tablet (40 mg total) by mouth daily at 12 noon.  . polyethylene glycol (MIRALAX / GLYCOLAX) packet Take 17 g by mouth 2 (two) times daily. (Patient taking differently: Take 17 g by mouth daily. )  . potassium chloride (MICRO-K) 10 MEQ CR capsule Take 10 mEq by mouth daily.   . predniSONE (DELTASONE)  20 MG tablet Take 2 tablets (40 mg total) by mouth daily with breakfast.  . senna (SENOKOT) 8.6 MG TABS tablet Take 2 tablets (17.2 mg total) by mouth at bedtime.  . sodium phosphate (FLEET) enema Place 1 enema rectally daily as needed (constipation). follow package directions      SIGNIFICANT DIAGNOSTIC EXAMS  02-07-15: chest x-ray: No acute cardiopulmonary findings. No evidence pneumonia. Hyperinflated lungs with right basilar scarring and atelectasis.  02-08-15: ct angio of chest: No evidence of pulmonary embolism.No acute cardiopulmonary disease. Minimal posterior bibasilar scarring/ atelectasis. Severe emphysematous disease. 1.4 cm spiculated nodule over the left  lower lobe suspicious for primary malignancy.   02-08-15: kub: Nonobstructive bowel gas pattern.   02-10-15: ct of head: 1. Stable atrophy and white matter disease. 2. No acute intracranial abnormality.     LABS REVIEWED:   02-07-15: wbc 7.2; hgb 9.0; hct 26.6; mcv 90.2; plt 168; glucose 142; bun 15; creat 1.43; k+3.3; na++140; C02: 41 02-08-15: wbc 4.3; hgb 9.0; hct 26.1; mcv 90.6; plt 114; glucose 142; bun 14; creat 1.34; k+4.8; na++142; C02: 41; liver normal albumin 3.5; mag 2.3; phos 46; tsh 0.251; free t4: 0.98 02-14-15: glucose 94; bun 19; creat 0.68; k+3.9; na++144; C02: 40      ROS Constitutional: Negative for malaise/fatigue.  Respiratory: Negative for cough, sputum production and shortness of breath.   Cardiovascular: Negative for chest pain and leg swelling.  Gastrointestinal: Negative for heartburn and constipation.  Musculoskeletal: Negative for myalgias and joint pain.  Skin: Negative.   Psychiatric/Behavioral: The patient is not nervous/anxious.      Physical Exam Constitutional: No distress.  Frail   Neck: Neck supple. No JVD present. No thyromegaly present.  Cardiovascular: Normal rate, regular rhythm and intact distal pulses.   Respiratory: Effort normal. No respiratory distress.  Breath sounds diminished   GI: Soft. Bowel sounds are normal. He exhibits no distension.  Musculoskeletal: Normal range of motion. He exhibits no edema.  Neurological: He is alert.  Skin: Skin is warm and dry. He is not diaphoretic.     ASSESSMENT/ PLAN:  Will discharge him to home with hospice care. He will not need dme; will not need other home health services. His prescriptions have been written for a 30 day supply of his medications with #30 ativan 0.5 mg tabs; 15 mL roxanol; #90 morphine sulfate 15 mg tabs.  He will follow up with hospice medical provider.   Time spent patient 35 minutes >50% spent with patient; to ensure after discharge care is arranged; reviewing prescriptions.     Ok Edwards NP [facility]  Contact [phone] Monday through Friday 8am- 5pm  After hours call [phone]

## 2015-05-13 DIAGNOSIS — J449 Chronic obstructive pulmonary disease, unspecified: Secondary | ICD-10-CM | POA: Diagnosis not present

## 2015-05-14 DIAGNOSIS — J449 Chronic obstructive pulmonary disease, unspecified: Secondary | ICD-10-CM | POA: Diagnosis not present

## 2015-06-02 DIAGNOSIS — 419620001 Death: Secondary | SNOMED CT | POA: Diagnosis not present

## 2015-06-02 DEATH — deceased

## 2016-04-04 IMAGING — DX DG ABDOMEN 1V
1 series · 1 of 1 positions shown · non-contrast
Comparison: CT abdomen and pelvis 06/24/2014.  Abdomen 06/24/2014.

CLINICAL DATA: Abdominal distention today.

EXAM:
ABDOMEN - 1 VIEW

[abdomen kub]
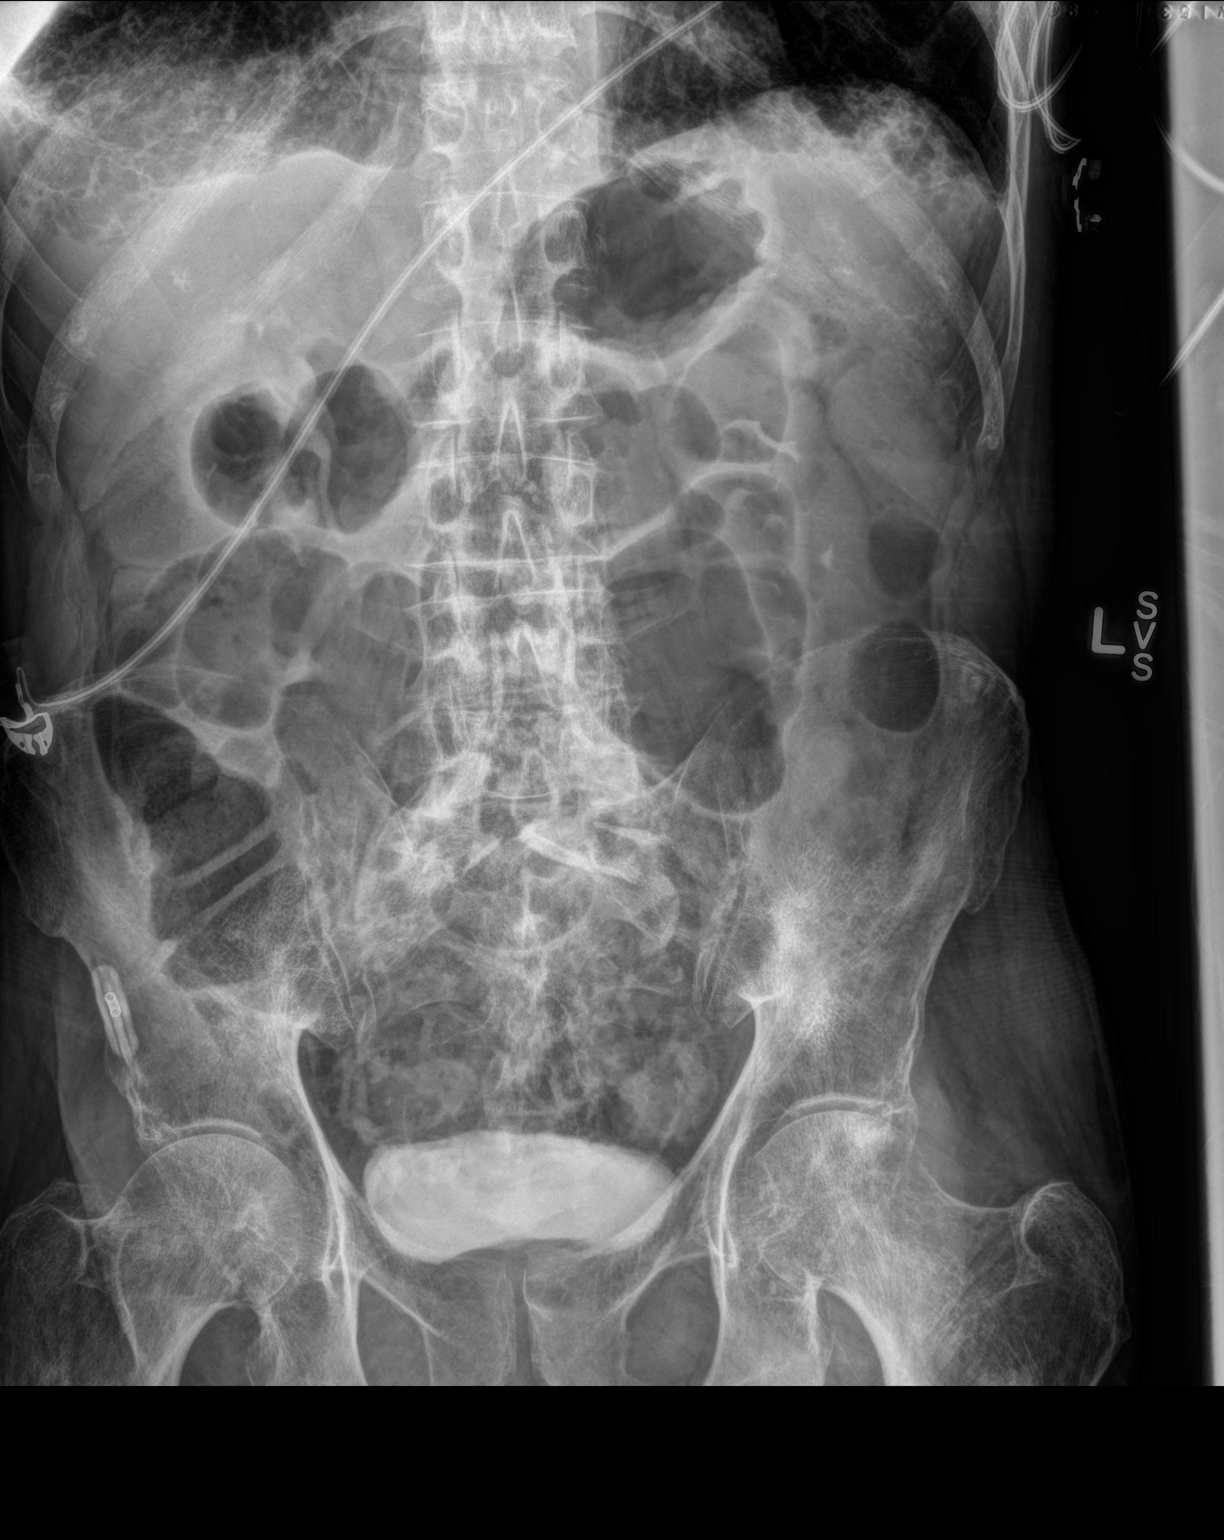

[1 of 1 positions shown; findings below may reference images not displayed]

FINDINGS: Scattered gas and stool throughout the colon. No small or large
bowel distention. Residual contrast material in the renal collecting
systems and bladder. Fibrosis in the lung bases. Degenerative
changes in the spine and hips. Bone sclerosis in the proximal femurs
probably representing bone infarcts due to sickle cell disease.
IMPRESSION: Nonobstructive bowel gas pattern.

## 2016-04-07 IMAGING — CT CT HEAD W/O CM
1 series · 16 of 30 positions shown, 20 images · non-contrast
Comparison: CT head without contrast 06/04/2014

CLINICAL DATA: Altered mental status.  Sickle cell anemia.

EXAM:
CT HEAD WITHOUT CONTRAST
TECHNIQUE: Contiguous axial images were obtained from the base of the skull
through the vertex without intravenous contrast.

[Series 2: headseq 4.8 h45s · axial · 0.43mm/px · z∈[-112,+40]mm · 16 of 36 slices shown, 20 images]
[im 2/36  brain]
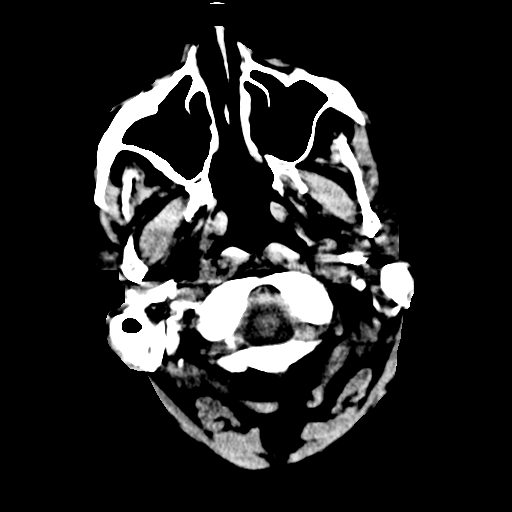
[im 2/36  bone]
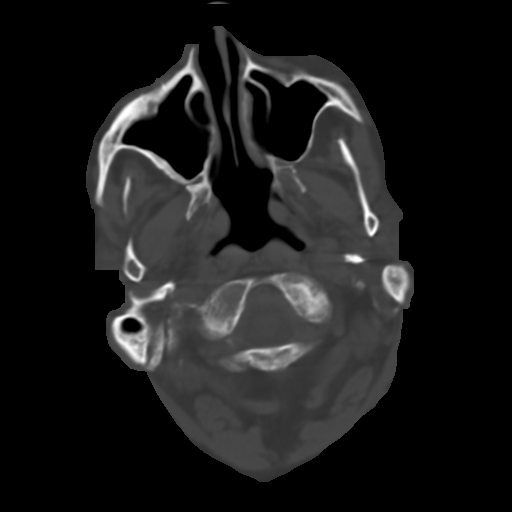
[im 4/36  brain]
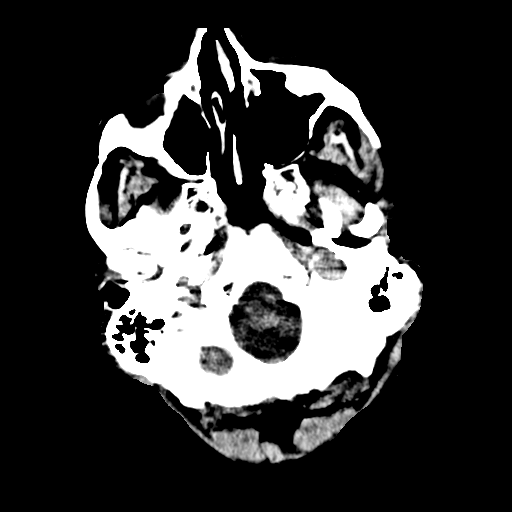
[im 7/36  brain]
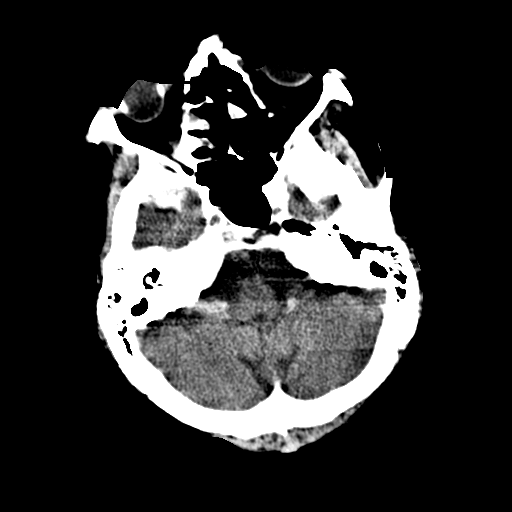
[im 9/36  brain]
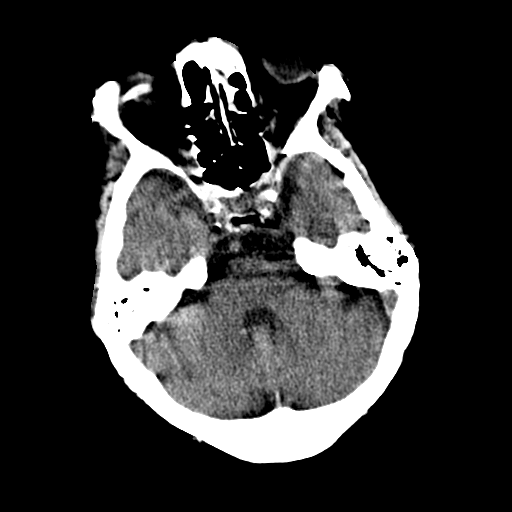
[im 10/36  brain]
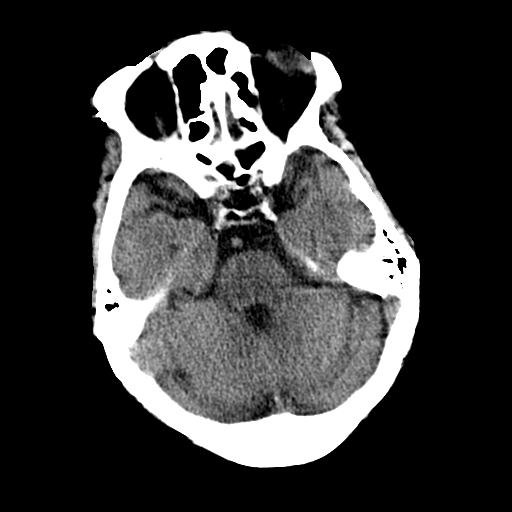
[im 10/36  bone]
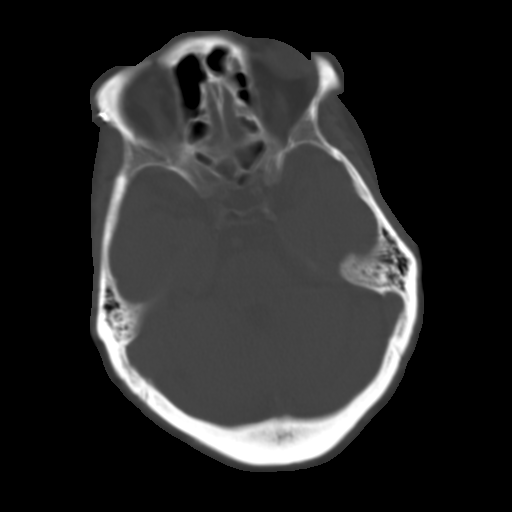
[im 13/36  brain]
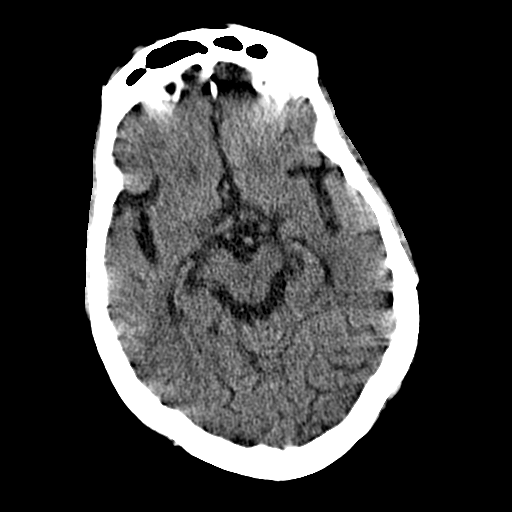
[im 15/36  brain]
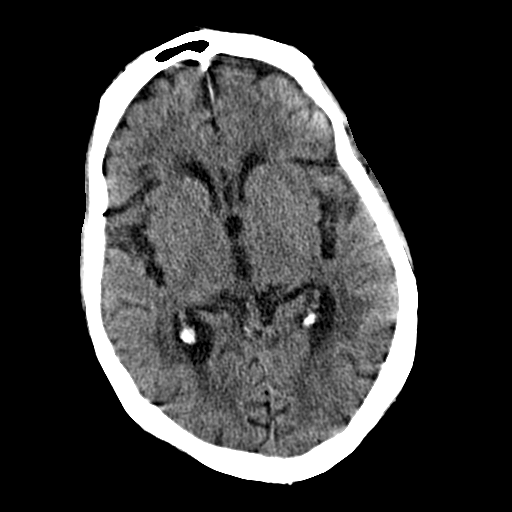
[im 17/36  brain]
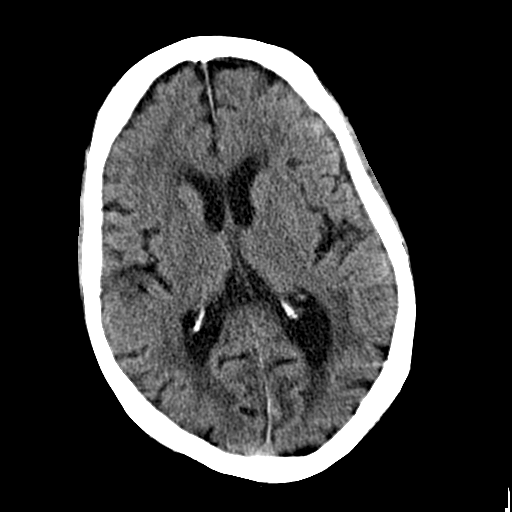
[im 19/36  brain]
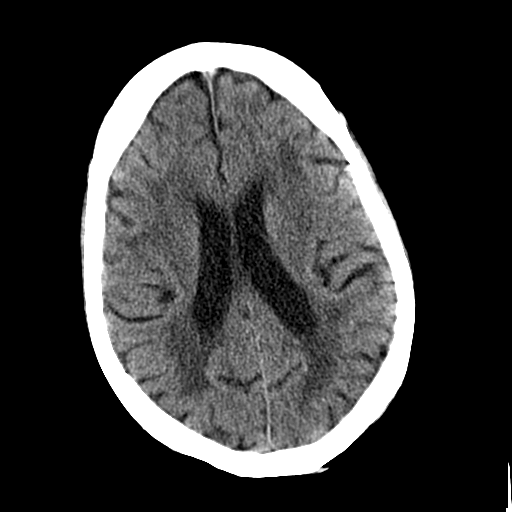
[im 19/36  bone]
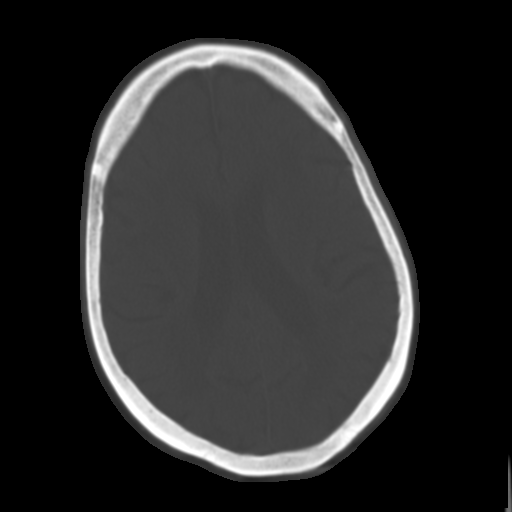
[im 21/36  brain]
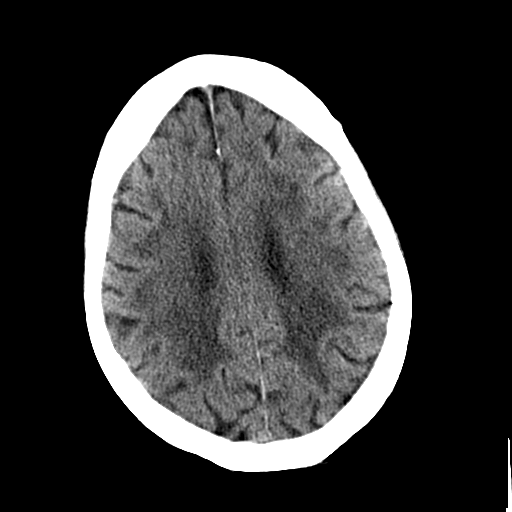
[im 23/36  brain]
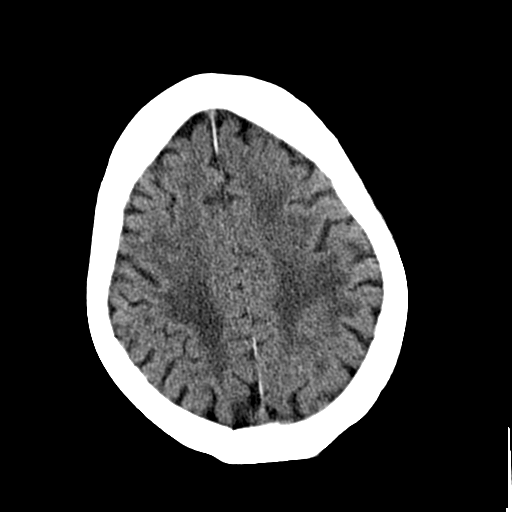
[im 26/36  brain]
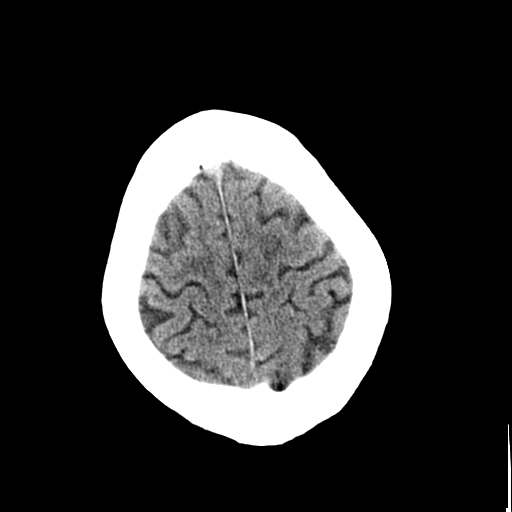
[im 27/36  brain]
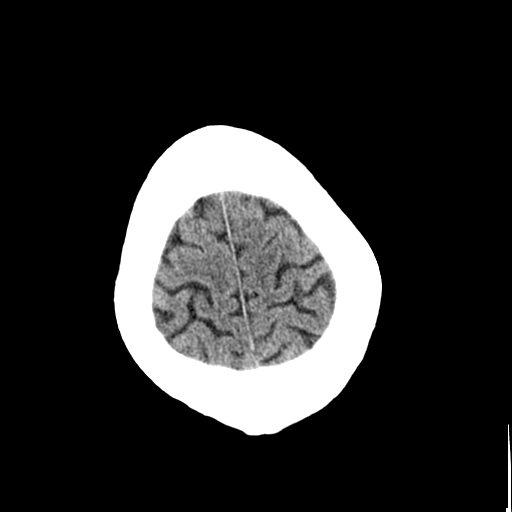
[im 27/36  bone]
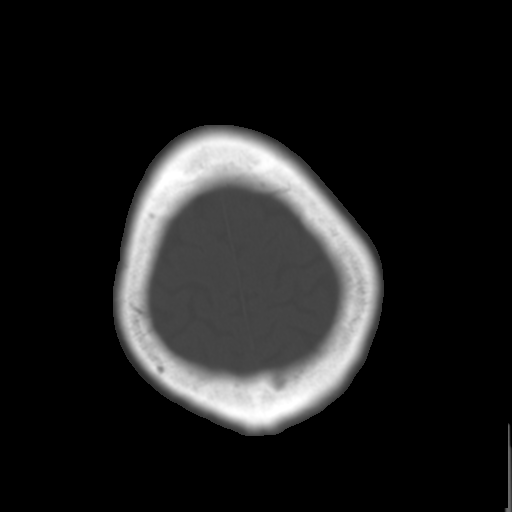
[im 29/36  brain]
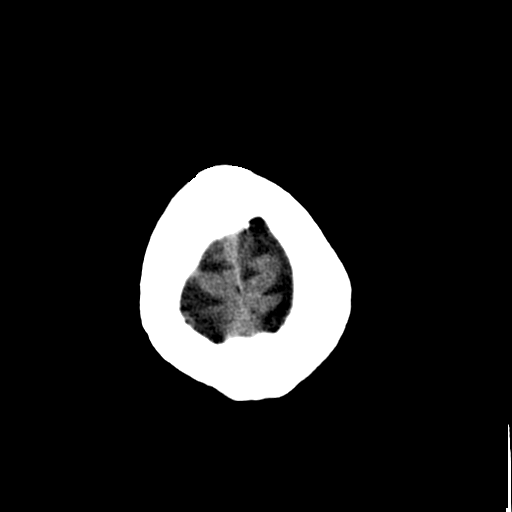
[im 32/36  brain]
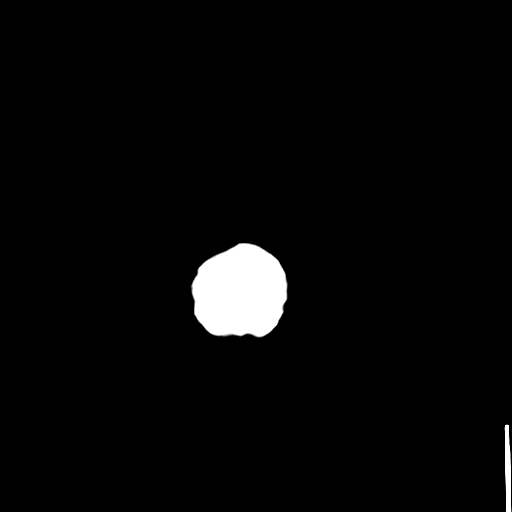
[im 34/36  brain]
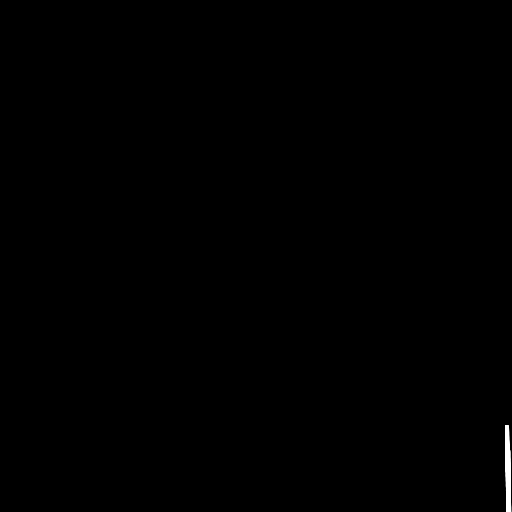

[16 of 30 positions shown; findings below may reference images not displayed]

FINDINGS: Atrophy and diffuse white matter changes are again noted. There is a
remote lacunar infarct within the right thalamus. Basal ganglia are
otherwise intact. The insular cortex is intact bilaterally. Acute
cortical defect is evident.

Postsurgical changes are again noted at the anterior wall right
maxillary sinus. The paranasal sinuses and mastoid air cells are
otherwise clear. The calvarium is intact. Scleral banding is noted
on the right. The globes and orbits are otherwise intact.
IMPRESSION: 1. Stable atrophy and white matter disease.
2. No acute intracranial abnormality.
